# Patient Record
Sex: Female | Born: 1945 | Race: White | Hispanic: No | Marital: Married | Smoking: Never smoker
Health system: Southern US, Community
[De-identification: ages and names within clinical notes are randomized; demographics above are authoritative.]

## PROBLEM LIST (undated history)

## (undated) DIAGNOSIS — E785 Hyperlipidemia, unspecified: Secondary | ICD-10-CM

## (undated) DIAGNOSIS — E039 Hypothyroidism, unspecified: Secondary | ICD-10-CM

## (undated) DIAGNOSIS — Q600 Renal agenesis, unilateral: Secondary | ICD-10-CM

## (undated) DIAGNOSIS — R002 Palpitations: Secondary | ICD-10-CM

## (undated) DIAGNOSIS — M797 Fibromyalgia: Secondary | ICD-10-CM

## (undated) DIAGNOSIS — F32A Depression, unspecified: Secondary | ICD-10-CM

## (undated) DIAGNOSIS — IMO0002 Reserved for concepts with insufficient information to code with codable children: Secondary | ICD-10-CM

## (undated) DIAGNOSIS — F329 Major depressive disorder, single episode, unspecified: Secondary | ICD-10-CM

## (undated) DIAGNOSIS — Z9889 Other specified postprocedural states: Secondary | ICD-10-CM

## (undated) DIAGNOSIS — M1990 Unspecified osteoarthritis, unspecified site: Secondary | ICD-10-CM

## (undated) DIAGNOSIS — R112 Nausea with vomiting, unspecified: Secondary | ICD-10-CM

## (undated) DIAGNOSIS — N189 Chronic kidney disease, unspecified: Secondary | ICD-10-CM

## (undated) DIAGNOSIS — E669 Obesity, unspecified: Secondary | ICD-10-CM

## (undated) DIAGNOSIS — I2510 Atherosclerotic heart disease of native coronary artery without angina pectoris: Secondary | ICD-10-CM

## (undated) DIAGNOSIS — J3489 Other specified disorders of nose and nasal sinuses: Secondary | ICD-10-CM

## (undated) DIAGNOSIS — I10 Essential (primary) hypertension: Secondary | ICD-10-CM

## (undated) HISTORY — DX: Hyperlipidemia, unspecified: E78.5

## (undated) HISTORY — PX: TONSILLECTOMY: SUR1361

## (undated) HISTORY — DX: Depression, unspecified: F32.A

## (undated) HISTORY — DX: Atherosclerotic heart disease of native coronary artery without angina pectoris: I25.10

## (undated) HISTORY — DX: Chronic kidney disease, unspecified: N18.9

## (undated) HISTORY — PX: CHOLECYSTECTOMY: SHX55

## (undated) HISTORY — PX: TOTAL KNEE ARTHROPLASTY: SHX125

## (undated) HISTORY — DX: Major depressive disorder, single episode, unspecified: F32.9

## (undated) HISTORY — DX: Hypothyroidism, unspecified: E03.9

## (undated) HISTORY — DX: Obesity, unspecified: E66.9

## (undated) HISTORY — DX: Palpitations: R00.2

## (undated) HISTORY — DX: Essential (primary) hypertension: I10

## (undated) HISTORY — PX: APPENDECTOMY: SHX54

---

## 1976-06-14 HISTORY — PX: TOTAL ABDOMINAL HYSTERECTOMY: SHX209

## 2002-04-03 ENCOUNTER — Ambulatory Visit (HOSPITAL_BASED_OUTPATIENT_CLINIC_OR_DEPARTMENT_OTHER): Admission: RE | Admit: 2002-04-03 | Discharge: 2002-04-03 | Payer: Self-pay | Admitting: Orthopedic Surgery

## 2002-06-14 HISTORY — PX: LAPAROSCOPIC INCISIONAL / UMBILICAL / VENTRAL HERNIA REPAIR: SUR789

## 2002-11-23 ENCOUNTER — Observation Stay (HOSPITAL_COMMUNITY): Admission: RE | Admit: 2002-11-23 | Discharge: 2002-11-24 | Payer: Self-pay | Admitting: General Surgery

## 2003-03-21 ENCOUNTER — Inpatient Hospital Stay (HOSPITAL_COMMUNITY): Admission: RE | Admit: 2003-03-21 | Discharge: 2003-03-26 | Payer: Self-pay | Admitting: Orthopedic Surgery

## 2003-03-24 ENCOUNTER — Encounter: Payer: Self-pay | Admitting: Orthopedic Surgery

## 2008-05-28 ENCOUNTER — Ambulatory Visit: Payer: Self-pay | Admitting: Cardiology

## 2008-05-31 ENCOUNTER — Ambulatory Visit: Payer: Self-pay

## 2008-06-17 ENCOUNTER — Ambulatory Visit: Payer: Self-pay | Admitting: Cardiology

## 2008-06-17 LAB — CONVERTED CEMR LAB
BUN: 10 mg/dL (ref 6–23)
Basophils Absolute: 0 10*3/uL (ref 0.0–0.1)
Basophils Relative: 0.6 % (ref 0.0–3.0)
CO2: 34 meq/L — ABNORMAL HIGH (ref 19–32)
Calcium: 9.8 mg/dL (ref 8.4–10.5)
Chloride: 104 meq/L (ref 96–112)
Creatinine, Ser: 0.8 mg/dL (ref 0.4–1.2)
Eosinophils Absolute: 0.3 10*3/uL (ref 0.0–0.7)
Eosinophils Relative: 4.7 % (ref 0.0–5.0)
GFR calc Af Amer: 94 mL/min
GFR calc non Af Amer: 78 mL/min
Glucose, Bld: 98 mg/dL (ref 70–99)
HCT: 42.7 % (ref 36.0–46.0)
Hemoglobin: 14.6 g/dL (ref 12.0–15.0)
INR: 1 (ref 0.8–1.0)
Lymphocytes Relative: 36.9 % (ref 12.0–46.0)
MCHC: 34.3 g/dL (ref 30.0–36.0)
MCV: 90.2 fL (ref 78.0–100.0)
Monocytes Absolute: 0.6 10*3/uL (ref 0.1–1.0)
Monocytes Relative: 9.3 % (ref 3.0–12.0)
Neutro Abs: 3.3 10*3/uL (ref 1.4–7.7)
Neutrophils Relative %: 48.5 % (ref 43.0–77.0)
Platelets: 241 10*3/uL (ref 150–400)
Potassium: 4 meq/L (ref 3.5–5.1)
Prothrombin Time: 10.6 s — ABNORMAL LOW (ref 10.9–13.3)
RBC: 4.73 M/uL (ref 3.87–5.11)
RDW: 12.2 % (ref 11.5–14.6)
Sodium: 143 meq/L (ref 135–145)
WBC: 6.7 10*3/uL (ref 4.5–10.5)
aPTT: 31.5 s — ABNORMAL HIGH (ref 21.7–29.8)

## 2008-06-18 ENCOUNTER — Ambulatory Visit: Payer: Self-pay | Admitting: Cardiovascular Disease

## 2008-06-18 ENCOUNTER — Inpatient Hospital Stay (HOSPITAL_BASED_OUTPATIENT_CLINIC_OR_DEPARTMENT_OTHER): Admission: RE | Admit: 2008-06-18 | Discharge: 2008-06-18 | Payer: Self-pay | Admitting: Cardiovascular Disease

## 2008-06-20 ENCOUNTER — Inpatient Hospital Stay (HOSPITAL_COMMUNITY): Admission: RE | Admit: 2008-06-20 | Discharge: 2008-06-24 | Payer: Self-pay | Admitting: Orthopedic Surgery

## 2008-06-28 DIAGNOSIS — I10 Essential (primary) hypertension: Secondary | ICD-10-CM

## 2008-06-28 DIAGNOSIS — E785 Hyperlipidemia, unspecified: Secondary | ICD-10-CM

## 2008-06-28 DIAGNOSIS — E039 Hypothyroidism, unspecified: Secondary | ICD-10-CM

## 2008-07-12 ENCOUNTER — Ambulatory Visit: Payer: Self-pay | Admitting: Cardiology

## 2008-09-09 ENCOUNTER — Encounter: Payer: Self-pay | Admitting: Cardiology

## 2008-09-09 ENCOUNTER — Encounter (INDEPENDENT_AMBULATORY_CARE_PROVIDER_SITE_OTHER): Payer: Self-pay | Admitting: *Deleted

## 2008-09-09 LAB — CONVERTED CEMR LAB
ALT: 11 units/L
AST: 15 units/L
Alkaline Phosphatase: 79 units/L
Cholesterol: 167 mg/dL
HDL: 66 mg/dL
LDL Cholesterol: 76 mg/dL
Total Bilirubin: 0.4 mg/dL
Triglyceride fasting, serum: 123 mg/dL

## 2008-11-12 ENCOUNTER — Encounter (INDEPENDENT_AMBULATORY_CARE_PROVIDER_SITE_OTHER): Payer: Self-pay | Admitting: *Deleted

## 2009-01-16 ENCOUNTER — Telehealth: Payer: Self-pay | Admitting: Cardiology

## 2009-01-22 ENCOUNTER — Encounter: Payer: Self-pay | Admitting: Cardiology

## 2009-01-22 ENCOUNTER — Ambulatory Visit: Payer: Self-pay | Admitting: Cardiology

## 2009-01-22 DIAGNOSIS — R079 Chest pain, unspecified: Secondary | ICD-10-CM

## 2009-01-22 DIAGNOSIS — R0602 Shortness of breath: Secondary | ICD-10-CM | POA: Insufficient documentation

## 2009-01-22 DIAGNOSIS — I2510 Atherosclerotic heart disease of native coronary artery without angina pectoris: Secondary | ICD-10-CM | POA: Insufficient documentation

## 2009-01-23 ENCOUNTER — Encounter: Payer: Self-pay | Admitting: Cardiology

## 2009-01-24 LAB — CONVERTED CEMR LAB: Pro B Natriuretic peptide (BNP): 50.8 pg/mL (ref 0.0–100.0)

## 2009-01-29 ENCOUNTER — Encounter: Payer: Self-pay | Admitting: Cardiology

## 2009-01-29 ENCOUNTER — Ambulatory Visit: Payer: Self-pay

## 2009-03-11 ENCOUNTER — Encounter: Payer: Self-pay | Admitting: Cardiology

## 2009-03-20 ENCOUNTER — Ambulatory Visit: Payer: Self-pay | Admitting: Cardiology

## 2009-03-20 DIAGNOSIS — F329 Major depressive disorder, single episode, unspecified: Secondary | ICD-10-CM

## 2009-04-14 ENCOUNTER — Encounter: Payer: Self-pay | Admitting: Cardiology

## 2009-12-04 ENCOUNTER — Encounter: Payer: Self-pay | Admitting: Cardiology

## 2010-05-22 ENCOUNTER — Encounter: Payer: Self-pay | Admitting: Cardiology

## 2010-06-11 ENCOUNTER — Ambulatory Visit: Payer: Self-pay | Admitting: Cardiology

## 2010-06-11 ENCOUNTER — Encounter: Payer: Self-pay | Admitting: Cardiology

## 2010-06-14 HISTORY — PX: OTHER SURGICAL HISTORY: SHX169

## 2010-07-14 NOTE — Letter (Signed)
Summary: [facility] Surgical Clearance   [facility] Surgical Clearance   Imported By: Roderic Ovens 12/17/2009 14:22:16  _____________________________________________________________________  External Attachment:    Type:   Image     Comment:   External Document

## 2010-07-16 NOTE — Assessment & Plan Note (Signed)
Summary: f1y/mj   History of Present Illness: [name] is a pleasant female that I saw in the past for an abnormal nuclear study performed in December of 2009.  There was mild anterior ischemia, although shifting breast attenuation could not be excluded.  Her ejection fraction was 58%. However, she did have some chest pain and we therefore wanted definitive evaluation.  She, therefore, underwent a cardiac catheterization on June 18, 2008.  She was found to have no obstructive coronary artery disease with the most significant being a 50-60% stenosis in the proximal right coronary artery.  Her ejection fraction was 50-55%. Stress echocardiogram in August of 2010 was normal. I last saw her in Octoberof 2010. Since then she denies any dyspnea on exertion, orthopnea, PND, pedal edema, palpitations, syncope or chest pain.   Current Medications (verified): 1)  Lisinopril 20 Mg Tabs (Lisinopril) .... Take One Tablet By Mouth Daily 2)  Levothyroxine Sodium 75 Mcg Tabs (Levothyroxine Sodium) .... Take 1 Tablet By Mouth Once A Day 3)  Crestor 20 Mg Tabs (Rosuvastatin Calcium) .... Take One Tablet By Mouth Daily. 4)  Aspirin 81 Mg Tbec (Aspirin) .... Take One Tablet By Mouth Daily 5)  Megared Cap .... Take 1 Capsule By Mouth Once A Day 6)  Women's Ultra Mega Active Vitapak .... One A Day 7)  Caltrate 600+d 600-400 Mg-Unit Tabs (Calcium Carbonate-Vitamin D) .Marland Kitchen.. 1 Tab By Mouth Once Daily 8)  Alprazolam 1 Mg Tabs (Alprazolam) .... As Needed 9)  Ambien Cr 12.5 Mg Cr-Tabs (Zolpidem Tartrate) .Marland Kitchen.. 1 Tab By Mouth At Bedtime 10)  Cymbalta 30 Mg Cpep (Duloxetine Hcl) .... 3 Daily  Allergies: No Known Drug Allergies  Past History:  Past Medical History: Reviewed history from 01/22/2009 and no changes required. hx of palpitations Depression Hyperlipidemia Hypertension Hypothyroidism Coronary artery disease  Past Surgical History: Reviewed history from 06/28/2008 and no changes required. Abdominal  Hysterectomy-Total 1980 Appendectomy Cholecystectomy 1996 Tonsillectomy knee replacement 2003  Social History: Reviewed history from 06/28/2008 and no changes required. Full Time Married  Tobacco Use - No.  Alcohol Use - no  Review of Systems       She does have some pain in her left shoulder from recent fracture as well as pain in her left knee but no fevers or chills, productive cough, hemoptysis, dysphasia, odynophagia, melena, hematochezia, dysuria, hematuria, rash, seizure activity, orthopnea, PND, pedal edema, claudication. Remaining systems are negative.   Vital Signs:  Patient profile:   65 year old female Height:      70 inches Weight:      236 pounds BMI:     33.98 Pulse rate:   72 / minute Resp:     14 per minute BP sitting:   127 / 87  (left arm)  Vitals Entered By: Kem Parkinson (June 11, 2010 12:26 PM)  Physical Exam  General:  Well-developed obese in no acute distress.  Skin is warm and dry.  HEENT is normal.  Neck is supple. No thyromegaly.  Chest is clear to auscultation with normal expansion.  Cardiovascular exam is regular rate and rhythm.  Abdominal exam nontender or distended. No masses palpated. Extremities show left upper extremity in a brace neuro grossly intact    EKG  Procedure date:  06/11/2010  Findings:      Sinus rhythm at a rate of 72. Nonspecific T-wave changes.  Impression & Recommendations:  Problem # 1:  CAD (ICD-414.00) Continue aspirin, ACE inhibitor and statin. Her updated medication list for this  problem includes:    Lisinopril 20 Mg Tabs (Lisinopril) .Marland Kitchen... Take one tablet by mouth daily    Aspirin 81 Mg Tbec (Aspirin) .Marland Kitchen... Take one tablet by mouth daily  Problem # 2:  HYPOTHYROIDISM (ICD-244.9)  Her updated medication list for this problem includes:    Levothyroxine Sodium 75 Mcg Tabs (Levothyroxine sodium) .Marland Kitchen... Take 1 tablet by mouth once a day  Problem # 3:  HYPERTENSION, BENIGN ESSENTIAL  (ICD-401.1) Blood pressure controlled. Continue present medication. Potassium and renal function followed by primary care. Her updated medication list for this problem includes:    Lisinopril 20 Mg Tabs (Lisinopril) .Marland Kitchen... Take one tablet by mouth daily    Aspirin 81 Mg Tbec (Aspirin) .Marland Kitchen... Take one tablet by mouth daily  Problem # 4:  HYPERLIPIDEMIA-MIXED (ICD-272.4) Continue statin. Lipids and liver monitored by primary care. Her updated medication list for this problem includes:    Crestor 20 Mg Tabs (Rosuvastatin calcium) .Marland Kitchen... Take one tablet by mouth daily.  Patient Instructions: 1)  Your physician wants you to follow-up in:  ONE YEAR You will receive a reminder letter in the mail two months in advance. If you don't receive a letter, please call our office to schedule the follow-up appointment.

## 2010-09-28 LAB — BASIC METABOLIC PANEL
BUN: 9 mg/dL (ref 6–23)
CO2: 30 mEq/L (ref 19–32)
CO2: 31 mEq/L (ref 19–32)
Calcium: 8.8 mg/dL (ref 8.4–10.5)
Calcium: 8.9 mg/dL (ref 8.4–10.5)
Chloride: 106 mEq/L (ref 96–112)
Creatinine, Ser: 0.73 mg/dL (ref 0.4–1.2)
GFR calc Af Amer: >60 mL/min (ref >60)
GFR calc Af Amer: >60 mL/min (ref >60)
GFR calc non Af Amer: >60 mL/min (ref >60)
Glucose, Bld: 105 mg/dL — ABNORMAL HIGH (ref 70–99)
Glucose, Bld: 127 mg/dL — ABNORMAL HIGH (ref 70–99)
Sodium: 135 mEq/L (ref 135–145)
Sodium: 139 mEq/L (ref 135–145)

## 2010-09-28 LAB — URINE CULTURE: Colony Count: >=100000

## 2010-09-28 LAB — URINALYSIS, ROUTINE W REFLEX MICROSCOPIC
Glucose, UA: NEGATIVE mg/dL
Ketones, ur: 15 mg/dL — AB
Protein, ur: NEGATIVE mg/dL
Urobilinogen, UA: 1 mg/dL (ref 0.0–1.0)

## 2010-09-28 LAB — PROTIME-INR: Prothrombin Time: 12.7 seconds (ref 11.6–15.2)

## 2010-09-28 LAB — COMPREHENSIVE METABOLIC PANEL
AST: 19 U/L (ref 0–37)
Albumin: 3.9 g/dL (ref 3.5–5.2)
Calcium: 9.6 mg/dL (ref 8.4–10.5)
Creatinine, Ser: 0.75 mg/dL (ref 0.4–1.2)
GFR calc Af Amer: >60 mL/min (ref >60)
GFR calc non Af Amer: >60 mL/min (ref >60)

## 2010-09-28 LAB — CBC
HCT: 33.2 % — ABNORMAL LOW (ref 36.0–46.0)
Hemoglobin: 10.3 g/dL — ABNORMAL LOW (ref 12.0–15.0)
Hemoglobin: 11.3 g/dL — ABNORMAL LOW (ref 12.0–15.0)
Hemoglobin: 9.4 g/dL — ABNORMAL LOW (ref 12.0–15.0)
MCHC: 33.6 g/dL (ref 30.0–36.0)
MCHC: 34 g/dL (ref 30.0–36.0)
MCHC: 34.2 g/dL (ref 30.0–36.0)
MCV: 89.1 fL (ref 78.0–100.0)
MCV: 89.6 fL (ref 78.0–100.0)
Platelets: 252 10*3/uL (ref 150–400)
RBC: 3.73 MIL/uL — ABNORMAL LOW (ref 3.87–5.11)
RDW: 12.8 % (ref 11.5–15.5)
RDW: 12.9 % (ref 11.5–15.5)

## 2010-09-28 LAB — DIFFERENTIAL
Eosinophils Relative: 3 % (ref 0–5)
Lymphocytes Relative: 32 % (ref 12–46)
Lymphs Abs: 2.3 10*3/uL (ref 0.7–4.0)
Monocytes Absolute: 0.5 10*3/uL (ref 0.1–1.0)

## 2010-09-28 LAB — TYPE AND SCREEN: ABO/RH(D): A POS

## 2010-09-28 LAB — URINE MICROSCOPIC-ADD ON

## 2010-10-27 NOTE — Assessment & Plan Note (Signed)
[facility]                            CARDIOLOGY OFFICE NOTE   NAME:AUGUSTINERand, Boller                      MRN:          [id number]  DATE:06/17/2008                            DOB:          1946-04-08    Ms. Eddington is a very pleasant 65 year old female who I recently saw  on May 28, 2008.  We were asked to see her prior to knee  replacement preoperatively and also secondary to chest pain.  We felt  that her symptoms are somewhat atypical.  We therefore scheduled her to  have a Myoview on May 31, 2008.  Her ejection fraction was 68%.  There was mild anterior ischemia (could not rule out shifting breast  attenuation).  Since then she has had one further episode of chest pain.  This occurred at rest approximately 2 weeks ago.  The pain was  substernal in location without radiation.  There was no associated  nausea, vomiting, shortness of breath or diaphoresis.  She described the  pain as a tightness.  It was not pleuritic, positional, nor was it  related to food.  It lasted for approximately 5-10 minutes and resolved  spontaneously.  She denies any dyspnea on exertion, orthopnea or PND.  She has not had any syncope.   MEDICATIONS INCLUDE:  1. Cymbalta 9 mg p.o. daily.  2. Pravachol 40 mg daily.  3. Levothyroxine 0.075 mg daily.  4. Lisinopril 10 mg p.o. daily.  5. Aspirin 81 mg p.o. daily.  6. Temazepam 30 mg p.o. daily.  7. Multivitamin.  8. Fish oil.   PHYSICAL EXAM:  Shows a blood pressure of 150/80 and her pulse is 56.  She weighs 211 pounds.  HEENT:  Normal.  NECK:  Supple with no bruits.  CHEST:  Clear.  CARDIOVASCULAR EXAM:  Reveals a regular rate and rhythm.  ABDOMINAL EXAM:  Shows no tenderness.  EXTREMITIES:  Show no edema.   DIAGNOSES:  1. Abnormal nuclear study:  I have reviewed the patient's study with      her and her husband.  There appears to be mild anterior ischemia,      although certainly shifting breast  attenuation could cause this      defect.  I do not think it is a high-risk study, but given the      recurrent nature of her symptoms, I think we need a definitive      evaluation, particular prior to her surgery.  We will therefore      schedule her for a cardiac catheterization.  The risk and benefits      have been discussed and she agrees to proceed.  We will try to      schedule this for tomorrow as her surgery is scheduled on the 7th      of this month.  I think if her catheterization is unremarkable,      then she could proceed safely with her surgery.  2. Preoperative evaluation prior to knee surgery:  We are scheduling      the catheterization as described above.  3. Hypertension:  We will  continue with the present blood pressure      medications.  Her lisinopril can be increased in the future if her      blood pressure continues to be elevated.  4. Hypothyroidism.  5. Hyperlipidemia:  She will continue on her Pravachol and this is      being managed by her primary care physician.  6. History of depression.   I will see her back in approximately 4 weeks.     Madolyn Frieze Jens Som, MD, [facility]  Electronically Signed    BSC/MedQ  DD: 06/17/2008  DT: 06/17/2008  Job #: [id number]   cc:   Thereasa Distance A. Chaney Malling, M.D.

## 2010-10-27 NOTE — Cardiovascular Report (Signed)
[name], BUSHARD             ACCOUNT NO.:  [id number]   MEDICAL RECORD NO.:  [id number]          PATIENT TYPE:  OIB   LOCATION:  1965                         FACILITY:  [facility]   PHYSICIAN:  Verne Carrow, MDDATE OF BIRTH:  01/15/1946   DATE OF PROCEDURE:  DATE OF DISCHARGE:                            CARDIAC CATHETERIZATION   PRIMARY CARDIOLOGIST:  Madolyn Frieze. Jens Som, MD, [facility]   PRIMARY CARE PHYSICIAN:  Dr. Lonie Peak.   PROCEDURES PERFORMED:  1. Left heart catheterization.  2. Selective coronary angiography.  3. Left ventricular angiogram.   OPERATOR:  Verne Carrow, MD   INDICATION:  Chest pain in a patient with an abnormal Myoview that  showed possible anterior wall ischemia.   DETAILS OF PROCEDURE:  The patient was brought to the outpatient cardiac  catheterization laboratory after signing informed consent for the  procedure.  The right groin was prepped and draped in the sterile  fashion.  A 1% lidocaine was used for local anesthesia.  A 4-French  sheath was inserted into the right femoral artery without difficulty.  A  4-French JL5 diagnostic catheter was used to selectively engage and  inject the left coronary system.  A 4-French 3DRC catheter was used to  selectively engage and inject the right coronary artery.  There was a  stenosis noted in the proximal portion of the right coronary artery that  was felt to be moderate in significance.  I did give 200 mcg of  intracoronary nitroglycerin to make sure this lesion was real.  Following the injection of nitroglycerin, the 50-60% lesion in the  proximal right coronary artery was persistent.  At this point, a 4-  [place] pigtail catheter was used to cross the aortic valve into the left  ventricle.  Following the performance of the left ventricular angiogram,  the pigtail catheter was pulled back across the aortic valve with no  significant pressure gradient measured.   The patient tolerated the  procedure well and was taken to the holding  area in stable condition.   ANGIOGRAPHIC FINDINGS:  1. The left main coronary artery is very short and has no evidence of      disease.  2. The left anterior descending is a large vessel that coursed to the      apex and gives off an early ramus intermedius branch.  There is a      30% plaque noted in the proximal portion of the ramus intermedius      branch.  There were 4 small-sized diagonal branches that arise from      the LAD.  There is plaque disease noted in the mid LAD with mild      luminal irregularities in the mid and distal LAD.  There is no      significant obstructive disease in the left anterior descending      system.  3. The circumflex artery is a moderate-sized vessel that gives off an      obtuse marginal branch.  There are mild luminal irregularities      noted in the mid circumflex artery as well as the obtuse marginal  branch.  There is no significant obstructive disease noted in this      vessel.  4. The right coronary artery is a large dominant vessel that has a 50-      60% stenosis in the proximal portion.  There is a 40% stenosis      noted in the mid-to-distal segment.  This artery bifurcates into a      large posterior descending artery and a branching posterolateral      segment.  5. Left ventricular angiogram demonstrates normal systolic function      with an ejection fraction of 50-55%.   HEMODYNAMIC DATA:  Central aortic pressure 167/78.  Left ventricular  pressure 163/13.  End-diastolic pressure 13.   IMPRESSION:  1. Moderate nonobstructive coronary artery disease.  2. Normal left ventricular systolic function.   RECOMMENDATIONS:  I recommend continued medical management.  The patient  will be seen in followup by Dr. Jens Som in 1-2 weeks.      Verne Carrow, MD  Electronically Signed     CM/MEDQ  D:  06/18/2008  T:  06/19/2008  Job:  [id number]   cc:   Madolyn Frieze. Jens Som, MD, [facility]   Dr. Lonie Peak

## 2010-10-27 NOTE — Op Note (Signed)
NAMEKORIE, STREAT             ACCOUNT NO.:  [id number]   MEDICAL RECORD NO.:  [id number]          PATIENT TYPE:  INP   LOCATION:  5039                         FACILITY:  [facility]   PHYSICIAN:  Lenard Galloway. Mortenson, M.D.DATE OF BIRTH:  21-Dec-1945   DATE OF PROCEDURE:  06/20/2008  DATE OF DISCHARGE:                               OPERATIVE REPORT   JUSTIFICATION:  A 62-year female with end-stage osteoarthritis of the  left knee which has failed all conservative care.  Now admitted for  total knee replacement on the left.  Questions were answered and  encouraged preoperatively.  Complications were discussed extensively and  the patient wishes to proceed.   PREOPERATIVE DIAGNOSIS:  End-stage osteoarthritis, left knee.   POSTOPERATIVE DIAGNOSIS:  End-stage osteoarthritis, left knee.   OPERATION:  Left total knee using computer navigation with a standard  plus left cemented femoral component and a size 4 cemented MBT keeled  tibial tray with a metal back patella standard plus cemented and a  standard plus 10-mm poly insert.   SURGEON:  Lenard Galloway. Chaney Malling, MD   ASSISTANT:  Oris Drone. Petrarca, PA-C   ANESTHESIA:  General.   PROCEDURE IN DETAIL:  The patient was placed on the operating table in  supine position and the pneumatic tourniquet applied to the left upper  thigh.  The entire left lower extremity was prepped with DuraPrep and  draped out in the usual manner.  After complete draping and prepping,  the leg was wrapped out in Esmarch and tourniquet was elevated.  An  incision made in the midline starting at the patella, tibial tubercle,  and carried proximally.  Skin edges were retracted.  Bleeders were  coagulated.  A long medial parapatellar incision was then made and the  patella was everted laterally.  Knee was flexed.  Both medial and  lateral menisci were excised and cruciate ligaments were excised.  Next,  the tibial and femoral arrays were inserted in a standard manner  using  Schanz pins.  Once this was accomplished, we then did computer  registration.  First we registered the femoral head centered, then a  tibial mechanical axis, then we defined the proximal tibial plateau and  then finally defined the medial and lateral femoral epicondyles.  Once  this was accomplished, this was registered and tibial planning was then  started.  Tibial resection guide was put in position and the settings  were registered.  Once this was accomplished, the tibial resection guide  was locked in place following computer promptings and locked in position  with fixation pins.  The proximal tibia was then amputated in standard  manner.  Using the confirmation guide, small adjustment was made in the  tibial cut.  Next, the soft tissue balancing was done both in flexion  and extension using a tension device and following computer promptings  which registered the balancing of the components.  Femoral implant  planning was then started.  Medial femoral condyle and lateral femoral  condyle were defined and planned.  Following computer promptings, the  resection guide #1 was placed over the distal end of the  femur, locked  in position, and anterior and posterior cuts were made.  The  confirmation guide was used to clean-up the cuts.  The cutting guide #2  was placed over the distal end of femur, locked in position according to  the computer promptings, and the distal end of the femur was then  amputated.  With the knee in extension and flexion, the spacer blocks  were inserted with a 10-mm block and it seemed to have excellent  balancing of the ligaments.  Once this was accomplished, the final  chamfer guide was placed over the distal end of the femur and locked in  position.  Chamfer cuts were made and drill holes were made.  All excess  bone was then removed.  Trial components were then selected, a size 4.  The tibia was then subluxed anteriorly and the tibial cut surface   measured to size 4.  The size 4 trial cutting block was put in position  and locked in place.  The tower was then put in place.  A drill hole was  placed in the proximal tibia.  The keel guide was then driven down  through the tibial trial and this locked this in very nicely.  A 10-mm  poly insert was inserted over the proximal end of the tibial trial.  A  standard plus femoral component was placed over the distal end of the  femur and a 10-mm trial poly was inserted and the knee was then  articulated.  The knee was then put through full range of motion.  There  was wonderful balancing of the collateral ligaments in both flexion and  extension.  The knee was completely stable.  There was full extension  and full flexion.  Next, the patella was everted laterally and the  posterior aspect of the patella was cut.  Three drill holes were placed  using cutting guide and a standard plus patella trial was inserted.  Again, the knee was articulated and put through full range of motion and  the patella tracked very nicely midline with no lateral tilting.  I was  very pleased with the tissue balancing and the components.  There was  full flexion, full extension, and according to the computer zero varus  and valgus deformity.  All the trial components were removed.  Pulsating  lavage was used to remove all debris.  Glue was mixed.  Each of the  components were then progressively inserted starting with tibia and then  the distal end of femur and the patella.  The poly insert trial was  inserted and knee articulated and held in extension as the glue cured.  Once the glue had set up, a small osteotome was used to remove any  excess glue.  The pulsating lavage was used once again and all debris  was removed.  Once this accomplished my satisfaction, FloSeal was  inserted and spread throughout all the soft tissue surfaces.  Bleeders  were coagulated.  Then the tourniquet was dropped and bleeders were   coagulated with good hemostasis.  No arterial pumpers were seen.  The  capsule was clean.  No bleeding posteriorly.  The final poly was then  inserted and snap-fitted in place.  Knee was put through full range of  motion again.  Again, there was wonderful flexion and extension with  stability.  AP drawer was negative.  Good alignment in varus and valgus.  The medial parapatellar incision was then closed with interrupted heavy  Ethibond  sutures.  Vicryl was used to close the subcutaneous tissue and  stainless steel staples were used to close the skin.  Sterile dressings  were applied and the patient returned to the recovery room in excellent  condition.  Technically, this procedure went extremely well.  I was very  pleased with final construct.   DRAINS:  None.      Rodney A. Chaney Malling, M.D.  Electronically Signed     RAM/MEDQ  D:  06/20/2008  T:  06/21/2008  Job:  [id number]

## 2010-10-27 NOTE — Assessment & Plan Note (Signed)
[facility]                            CARDIOLOGY OFFICE NOTE   NAME:AUGUSTINEChantale, Drake                      MRN:          [id number]  DATE:05/28/2008                            DOB:          1945-08-07    [name] is a very pleasant 65 year old female with past medical  history of hypertension, hyperlipidemia, hypothyroidism whom I have  asked to evaluate for chest pain preoperatively prior to knee  replacement.  The patient states that in the 1980s, she was seen at the  [facility] for palpitations.  Apparently, the workup was negative,  but I do not have those records available.  She does have a history of  palpitations and has previously been on Cardizem.  However, recently,  she was complaining of fatigue and noticed that her heart rate was in  the 40s.  Her Cardizem apparently has been discontinued and she is now  on lisinopril and her palpitations are improved, and her fatigue is also  improved.  She is also scheduled to undergo knee replacement and we were  asked to evaluate preoperative.  Note, approximately 2 weeks ago, while  riding to the gym, she developed substernal chest pain.  It is described  as a burning sensation.  It did not radiate.  It was not pleuritic or  positional nor is it related to food.  There was no associated nausea,  vomiting, shortness of breath, or diaphoresis.  It lasted for  approximately 15 minutes and resolved spontaneously.  She had no pain  since then.  Note, she does not have dyspnea on exertion, orthopnea,  PND, pedal edema, palpitations at present or syncope.  She does exercise  routinely without having chest pain.   MEDICATIONS:  1. Cymbalta 90 mg p.o. daily.  2. Pravachol 40 mg p.o. daily.  3. Levothyroxine 0.075 mg p.o. daily.  4. Lisinopril 10 mg p.o. daily.  5. Aspirin 81 mg p.o. daily.  6. Temazepam 30 mg p.o. daily.  7. Multivitamin daily.  8. Fish oil.   ALLERGIES:  She has no known  drug allergies.   SOCIAL HISTORY:  She does not smoke nor does she consume alcohol.  She  is married.   FAMILY HISTORY:  Unknown as she is adopted.   PAST MEDICAL HISTORY:  Significant for hypertension and hyperlipidemia.  There is no diabetes mellitus.  She does have hypothyroidism.  She has a  history of palpitations as described in the HPI.  She does have a  history of depression as well.  She has had previous abdominal surgeries  as well as hysterectomy, appendectomy, cholecystectomy, and  tonsillectomy.  She has had previous knee replacement as well.   REVIEW OF SYSTEMS:  She denies any headaches or fevers or chills.  There  is no productive cough or hemoptysis.  There is no dysphagia,  odynophagia, melena, or hematochezia.  There is no dysuria or hematuria.  There is no rash or seizure activity.  There is no orthopnea, PND, or  pedal edema.  She does have some pain in her knees with ambulation.  Remaining systems are negative.  PHYSICAL EXAMINATION:  VITAL SIGNS:  Today shows a blood pressure of  130/78 and pulse is 56.  She weighs 211 pounds.  GENERAL:  She is well-developed and somewhat abuse.  She is in no acute  distress at present.  SKIN:  Warm and dry.  She does not appear to be depressed.  There is no  peripheral clubbing.  BACK:  Normal.  HEENT:  Normal with normal eyelids.  NECK:  Supple with a normal upstroke bilaterally.  There are no bruits  noted.  There is no jugular venous distention.  I could not appreciate  thyromegaly.  CHEST:  Clear to auscultation.  No expansion.  CARDIOVASCULAR:  Regular rate and rhythm.  Normal S1, S2.  There are no  murmurs, rubs, or gallops noted.  ABDOMEN:  Nontender, nondistended.  Positive bowel sounds.  No  hepatosplenomegaly.  No mass appreciated.  There is no abdominal bruit.  She has 2+ femoral pulses bilaterally.  No bruits.  EXTREMITIES:  No edema.  I could palpate no cords.  She has 2+ dorsalis  pedis pulses  bilaterally.  NEUROLOGIC:  Grossly intact.   Her electrocardiogram shows a sinus rhythm at a rate of 56.  The axis is  normal.  There are no significant ST changes noted.   DIAGNOSES:  1. Chest pain - Ms. Malak's symptoms are somewhat atypical.      However, given her risk factors in upcoming surgery, we will plan      to proceed with an adenosine Myoview.  If it shows no ischemia,      then we would not pursue further cardiac evaluation.  2. Preoperative evaluation prior to knee surgery - Again, we are      proceeding with a Myoview.  If this is normal, then she can proceed      with her surgery without further cardiac workup.  3. Hypertension - Her blood pressure is adequately controlled on      present medications.  4. Palpitations - These have improved off of the Cardizem.  If they      worsen in the future, then we could consider a low-dose beta-      blockade as tolerated by pulse.  5. Hypothyroidism.  6. Hyperlipidemia - She will continue on Pravachol, this is being      followed by Dr. Anna Genre.  7. History of depression.   We will see her back on an as-needed basis, pending the results of her  Myoview.     Madolyn Frieze Jens Som, MD, [facility]  Electronically Signed    BSC/MedQ  DD: 05/28/2008  DT: 05/29/2008  Job #: [id number]   cc:   Thereasa Distance A. Chaney Malling, M.D.  Dr. Lonie Peak

## 2010-10-27 NOTE — Assessment & Plan Note (Signed)
[facility]                            CARDIOLOGY OFFICE NOTE   NAME:AUGUSTINEHaylie, [name] Drake                      MRN:          [id number]  DATE:07/12/2008                            DOB:          1945/12/18    [name] Drake is a pleasant 65 year old female that I recently saw for  an abnormal nuclear study.  There was mild anterior ischemia, although  shifting breast attenuation could not be excluded.  However, she did  have some chest pain and we therefore wanted definite evaluation.  She,  therefore, underwent a cardiac catheterization on June 18, 2008.  She  was found to have no obstructive coronary artery disease with a most  significant being a 50-60% stenosis in the proximal right coronary  artery.  Her ejection fraction was 50-55%.  We therefore treated  medically.  Since then, she has had a knee replaced.  However, there is  no dyspnea, chest pain, palpitations, or syncope.   MEDICATIONS:  1. Cymbalta 90 mg p.o. daily.  2. Pravachol 40 mg p.o. daily.  3. Levothyroxine 0.075 mg p.o. daily.  4. Lisinopril 10 mg p.o. daily.  5. Temazepam 30 mg p.o. daily.  6. Multivitamin.  7. Calcium.  8. Stool softener.  9. Aspirin 325 mg p.o. daily.   PHYSICAL EXAMINATION:  VITAL SIGNS:  Blood pressure of 145/86 and her  pulse is 83.  She weighs 213 pounds  HEENT:  Normal.  NECK:  Supple.  CHEST:  Clear.  CARDIOVASCULAR:  Regular rhythm.  ABDOMEN:  No tenderness.  EXTREMITIES:  Mild edema on the left where she had a knee replaced.   DIAGNOSES:  1. Moderate coronary artery disease by catheterization - She will      continue on her aspirin, lisinopril, and statin.  2. Hyperlipidemia - We will have her most recent lipids and liver      forwarded to [place] from Dr. Dannielle Huh office.  A goal LDL should be      less than 70, given history of coronary disease.  3. Hypertension - her blood pressure is mildly elevated.  However, she      states it runs normal at  home.  We will make no changes.  4. Hypothyroidism.  5. History of depression.  6. Status post knee replacement.   I will see her back in 1 year.     Madolyn Frieze Jens Som, MD, [facility]  Electronically Signed    BSC/MedQ  DD: 07/12/2008  DT: 07/13/2008  Job #: [id number]   cc:   Dr. Lonie Peak

## 2010-10-30 NOTE — Op Note (Signed)
NAMEARLENA, MARSAN                         ACCOUNT NO.:  [id number]   MEDICAL RECORD NO.:  [id number]                   PATIENT TYPE:  OBV   LOCATION:  0462                                 FACILITY:  [facility]   PHYSICIAN:  Angelia Mould. Derrell Lolling, M.D.             DATE OF BIRTH:  [birth year]   DATE OF PROCEDURE:  11/23/2002  DATE OF DISCHARGE:                                 OPERATIVE REPORT   PREOPERATIVE DIAGNOSIS:  Ventral hernia.   POSTOPERATIVE DIAGNOSIS:  Ventral hernia.   OPERATION PERFORMED:  Repair of ventral hernia with 15 cm x 19 cm dual Gore-  Tex mesh.   SURGEON:  Angelia Mould. Derrell Lolling, M.D.   OPERATIVE INDICATIONS:  This is a 65 year old white female who has had a  total abdominal hysterectomy and a laparoscopic cholecystectomy in the past.  She noticed a painful bulge above her umbilicus for about three months, and  the discomfort is progressive.  She denies nausea, vomiting or diffuse pain.  She denies hernia problems in the past.   On examination she is obese.  In the midline about 6 cm above the umbilicus  there is a reducible hernia, probably 4-6 cm in size.  There is no obvious  incision in this area, but there are incisions around the umbilicus.  I  thought that this might be somewhat complex and recommended laparoscopic  repair.  She is brought to the operating room electively.   OPERATIVE FINDINGS:  The patient had multiple defects, starting at the  umbilicus and going up the midline for about 8 cm.  There were some  incarcerated omentum and fat in these, which we were able to debride out.  The liver, stomach, small intestine and large intestine looked grossly  normal.  There was no other peritoneal abnormality.  The lower midline  looked perfectly solid and secure.   OPERATIVE TECHNIQUE:  Following the induction of general endotracheal  anesthesia, the patient's abdomen was prepped and draped in a sterile  fashion.  She had been given subcutaneous Heparin 30  min preoperatively, and  intravenous antibiotics preoperatively.   A 2 cm transverse incision was made in the left flank.  Dissection was  carried down through the subcutaneous tissue in the muscle layers, and the  abdominal cavity was entered under direct vision.  A 10 mm Hasson trocar was  inserted and secured with a pursestring suture of 0 Vicryl.  Pneumoperitoneum was created.  The camera was inserted, with visualization  and findings as described above.  There was no bleeding from the trocar  site, and the intestinal tracts looked fine.   I ultimately placed a 5 mm trocar in the left upper quadrant, a 5 mm trocar  in the left lower quadrant, and two 5 mm trocars on the right mid abdomen.  We used the electrocautery, blunt dissection and harmonic scalpel to dissect  the incarcerated omentum from the supraumbilical hernia part.  We also  debrided the calciform ligament superiorly, so that we could secure the mesh  to the abdominal wall.  We found about three defects, none of which was very  large, but at least 3-4 cm in size.   Using a spinal needle, we marked the edges of the weakened area and then  drew an ellipse around this about 3 cm outside of this.  It turned out to be  about 14 x 16 cm .  There was a piece of precut dual mesh that was 15 x 19  cm, and so we chose to use that.  We brought the mesh to the operating  table.  We marked the smooth side.  We placed it on the abdominal wall,  overlying the marked template, and then remarked the outlines of the mesh.  We marked equidistant points on the mesh for suture fixation.  We used 0  Novofil popoff sutures as mattress sutures at the equidistant points on the  edge of the mesh, for intended suture fixation.  We marked the mesh to  orient it to the abdominal wall template.   We then rolled the mesh up and inserted into the abdominal cavity.  We  spread the mesh out and oriented it.  We made small incisions at the outside  of  the eight equidistant points marked on the template, and drew the sutures  through the abdominal wall, being sure to take at least a 1 cm bite of the  abdominal wall fascia.  After all of the sutures had been placed, we then  pulled the mesh up and found that it looked like it was covering the defect  point nicely, and there was no significant redundancy nor was there any  unusual tension.  We then just tied all of the fixation sutures, inspected  this and it looked fine.  We then secured the edge of the mesh with 5 mm  screw tacks all the way around, placing the screw tacks no more than 1 cm  apart circumferentially.  We inspected this from the right and the left  sides on two separate occasions, and found that it was completely secure.  We placed several of the  5 mm tacks more centrally on the mesh to secure the center part of the mesh  to the peritoneum.  We inspected everywhere, both in the upper abdomen, mid  abdomen and pelvis; found no evidence of any bleeding or bowel injury.  The  trocars were removed under direct vision.  There was no bleeding from the  trocar sites.   The pneumoperitoneum was released.  The fascia in the left flank incision  was closed with 0 Vicryl sutures.  This was irrigated out, and the incisions  were closed with subcuticular sutures of 4-0 Vicryl and Steri-Strips.  Clean  bandages were placed.  The patient was taken to the recovery room in stable  condition.   ESTIMATED BLOOD LOSS:  Approximately 25 cc.   COMPLICATIONS:  None.   COUNTS:  Sponge, needle and instrument counts were correct.                                                Angelia Mould. Derrell Lolling, M.D.    HMI/MEDQ  D:  11/23/2002  T:  11/24/2002  Job:  [id number]   cc:   Molly Maduro  Genene Churn, M.D.  [address]  [place]  [place] [address]  Fax: [phone]

## 2010-10-30 NOTE — Consult Note (Signed)
[name], [name]                         ACCOUNT NO.:  [id number]   MEDICAL RECORD NO.:  [id number]                   PATIENT TYPE:  INP   LOCATION:  5008                                 FACILITY:  [facility]   PHYSICIAN:  Lonia Blood, M.D.            DATE OF BIRTH:  1946-01-16   DATE OF CONSULTATION:  03/24/2003  DATE OF DISCHARGE:                                   CONSULTATION   REASON FOR CONSULTATION:  Hypoxia with lethargy.   HISTORY OF PRESENT ILLNESS:  [name] is a pleasant 65 year old female  who is admitted to the hospital with severe progressive right knee pain due  to end-stage osteoarthritis.  She underwent a right total knee replacement  March 21, 2003.  She has been treated with DVT prophylaxis since that time  using Lovenox, b.i.d. dosing.  Postoperative course has been unremarkable  until today.  When attempting to get up with physical therapy, the patient's  O2 saturations dropped into the mid 80's.  She became acutely short of  breath.  This episode resolved rapidly.  She has felt extremely weak all  day, however, but has no specific focused complaints.  She specifically  denies chest pain, nausea, vomiting, abdominal pain, shortness of breath, at  this time.  She denies palpitations.   REVIEW OF SYSTEMS:  Unremarkable at this time with exception to elements  noted in history of present illness above.   PAST MEDICAL HISTORY:  1. End-stage osteoarthritis of the right knee.  2. Hyperlipidemia.  3. Questionable irregular heart beat with previous workup at [facility] being negative.  4. Depression.  5. Status post tonsillectomy, cholecystectomy, abdominal hernia repair, and     hysterectomy.  6. History of tobacco abuse, discontinued 15 years ago.   OUTPATIENT MEDICATIONS:  Lipitor, clonazepam, Lexapro, aspirin,  multivitamin, calcium.   ALLERGIES:  No known drug allergies.   FAMILY HISTORY:  Adopted; therefore, history unknown.   SOCIAL HISTORY:  The patient denies alcohol use, denies illicit drug use.  She is married and has two children, both sons.  She is a Solicitor by  profession.   LABORATORY DATA:  The 12-lead EKG is normal sinus rhythm at 70 beats per  minute with some T wave aversions though minimal in III, aVF and V2 and V3.  Cardiac enzymes were unremarkable.  Basic metabolic panel is remarkable for  hypokalemia with potassium 3.3 and normal BUN to creatinine ratio.  Hemoglobin is at 8.6 with admission hemoglobin 15.3.  Platelets and white  blood cell count are normal.  Urinalysis is positive for large leukocyte  esterase and 21-50 white cells on October 4.  Chest x-ray today reveals tiny  bilateral effusions with right basilar infiltrate versus atelectasis.   PHYSICAL EXAMINATION:  VITAL SIGNS:  Temperature 99.3, blood pressure  122/48, heart rate 71, O2 saturation 95% on room air at present, 98% on 2 L.  GENERAL:  Moderately obese, well-developed, well-nourished female in no  acute respiratory distress at this time.  HEENT:  Normocephalic, atraumatic.  Pupils are equal, round and reactive to  light and accommodation.  Extraocular muscles intact bilaterally.  __________  are shown to be clear.  NECK:  No JVD, no lymphadenopathy, no thyromegaly.  LUNGS:  Mild bibasilar crackles; otherwise unremarkable without wheeze or  rhonchi.  CVS:  Regular rate and rhythm without appreciable murmur, gallop or rub with  normal S1 and S2.  ABDOMEN:  Soft.  Bowel sounds present.  No hepatosplenomegaly.  No rebound,  no ascites.  Bowel sounds positive.  EXTREMITIES:  Right lower extremity in CPM machine.  Bilateral lower  extremities without evidence of erythema or significant edema.  NEUROLOGIC:  Nonfocal.  The patient is alert and oriented x4.   RECOMMENDATIONS:  1. Hypoxemia with lethargy.  This is likely a combination of significant     atelectasis as well as possible contribution from significant anemia.      Though the patient's hemoglobin is currently 8.6, she is actually     somewhat polycythemic at baseline with a hemoglobin of approximately 15.     Chest x-ray does not reveal evidence of focal infiltrate.  History     certainly does not rule out pulmonary embolus, but in the absence of     chest pain, my suspicion this is a not exceedingly high.  The patient has     also been treated with Lovenox appropriately.  I will encourage the     patient to use her incentive spirometer.  We will continue supplemental     oxygen at this time.  We will encourage mobilization as soon as able.  We     will continue Lovenox.  We will transfuse one unit and follow symptoms.     Should the patient's hypoxia continue, we will consider CT scan of the     chest to rule out pulmonary embolism.  2. Acute blood loss anemia, symptomatic.  The patient has a baseline     hemoglobin of approximately 15 per preop labs.  Hemoglobin is 8.6 at this     time.  Though a hemoglobin of 8.6 in and of itself is not a clear     indication for transfusion at this age, the patient is, in my opinion,     symptomatic from this hemoglobin because of her elevated baseline and I     will, therefore, transfuse one unit.  I will hold a second unit and     consider transfusing it depending upon the patient's symptom complex in     the morning.  3. Urinary tract infection.  Urinalysis obtained on October 4 was strongly     suggestive of a urinary tract infection.  It is not uncommon for this to     present in this age group of females as severe lethargy.  I will,     therefore, treat empirically and request urine culture.   Thank you very much for your consultation on this pleasant lady.  I will be  happy to follow along with you.                                               Lonia Blood, M.D.    JTM/MEDQ  D:  03/24/2003  T:  03/24/2003  Job:  [id number]

## 2010-10-30 NOTE — H&P (Signed)
[name], [name]                         ACCOUNT NO.:  [id number]   MEDICAL RECORD NO.:  [id number]                   PATIENT TYPE:  INP   LOCATION:  NA                                   FACILITY:  [facility]   PHYSICIAN:  Rodney A. Chaney Malling, M.D.           DATE OF BIRTH:  1945-10-27   DATE OF ADMISSION:  03/21/2003  DATE OF DISCHARGE:                                HISTORY & PHYSICAL   CHIEF COMPLAINT:  Progressive right knee pain since 2004.   HISTORY OF PRESENT ILLNESS:  This 65 year old white female patient presented  to Dr. Lenard Galloway. [place] with a history of right knee pain since her last  knee arthroscopy.  She has had a right knee arthroscopy done by Dr.  Chaney Malling in March 2003, and then again in April 2003, due to recurrent  meniscal tears.  Since that last surgery she has been having progressively  more knee pain.  She has had no other injury to the knee.  At this point the pain is a constant burning or aching sensation, located  mostly over the lateral knee joint, with some radiation into the distal  thigh.  The pain increases the first thing in the morning, or if she sits  for a prolonged period of time, and then tries to stand.  It also hurts more  if she has to walk for a prolonged period of time.  The pain decreases with  elevation and ice.  The knee grinds and swells occasionally, but no popping,  catching, locking or giving way.  She is currently taking Aleve for pain,  and that provides a minimal amount of relief.  She has tried cortisone and  Hyalgan injections in the past, and that really provided no relief.  She is  not using any assistive devices.   ALLERGIES:  No known drug allergies.   CURRENT MEDICATIONS:  1. Lipitor 10 mg p.o. q.a.m.  2. Clonazepam 2 mg p.o. q.h.s.  3. Lexapro 10 mg p.o. q.a.m.  4. Aspirin 81 mg p.o. q.a.m., last dose on March 18, 2003.  5. Multivitamin one tab p.o. q.a.m.  6. Calcium 600 mg, one tab p.o. q.a.m.   PAST MEDICAL  HISTORY:  1. History of irregular heart beat approximately four years ago, that was     evaluated at the [facility], and the workup was completely     negative.  2. Hypercholesterolemia.  3. Depression.  She denies any history of diabetes mellitus, hypertension, thyroid disease,  hiatal hernia, reflux, peptic ulcer disease, heart disease, asthma, or any  other chronic medical condition.   PAST SURGICAL HISTORY:  1. Tonsillectomy in [place], [place] in 1953.  2. Laparoscopic cholecystectomy by Dr. Youlanda Mighty in [place], [place] in 2000.  3. Repair of an abdominal hernia by Dr. Angelia Mould. Derrell Lolling in [place],     [place] in 2004.  4. Hysterectomy by Dr. Logan Bores in [place], [place].  5. Left knee arthroscopy by Dr. Chaney Malling on April 03, 2002.  6. Right knee arthroscopy by Dr. Chaney Malling on August 22, 2001.  7. Right knee arthroscopy by Dr. Chaney Malling on September 21, 2001.  She denies any complications from the above-mentioned procedures.   SOCIAL HISTORY:  She has a one to two-pack-year-history of cigarette  smoking, which she quit 15 years ago.  She does not drink any alcohol or use  any drugs.  She is married and has two sons.  She and her husband live in a  one-story house, with four steps into the main entrance.  She works as a  Solicitor at [employer].  Her medical doctor is Dr. Andres Ege in  [place], [place].  His phone [phone].  Mr. Lonie Peak who is  P.A. for Dr. Fayrene Fearing saw her on March 19, 2003, and feels she is cleared for  surgery from a medical standpoint.   FAMILY HISTORY:  She is adopted and does not know anything about her natural  family.  Her sons are age 53 and 12, and they are both alive and well.   REVIEW OF SYSTEMS:  She has some numbness on the left side of her face, and  that was evaluated by both an ENT physician and a neurologist.  Everything  has been negative.  They do not know what has caused this or how to treat  it, so it has  been going on for about one year.  The area of numbness goes  from her left eye over to her nose and down her left lip, probably in a  trigeminal pattern.  She does complain of occasional diarrhea.  She has a  living will and her power of attorney is Mr. Milani Lowenstein, who is her  husband.  All other systems are negative and noncontributory.   PHYSICAL EXAMINATION:  GENERAL:  A well-nourished, well-developed,  moderately-overweight white female, in no acute distress.  Walks without a  limp.  Her mood and affect are appropriate.  Height is 5 feet 10 inches,  weight 224 pounds.  BMI is 31.  VITAL SIGNS:  Temperature 96.3 degrees F, pulse 58, respirations 14, blood  pressure 140/92.  HEENT:  Normocephalic, atraumatic without frontal or maxillary sinus  tenderness to palpation.  Conjunctivae pink.  Sclerae anicteric.  PERLA.  EOM's intact.  No visible external ear deformities.  Hearing is grossly  intact.  Tympanic membranes pearly gray bilaterally with good light reflex.  Nose and nasal septum midline.  Nasal mucosa pink and moist without exudates  or polyps noted.  Buccal mucosa pink and moist.  Good dentition.  Pharynx is  without erythema or exudates.  Tongue and uvula midline.  Tongue without  fasciculations.  Uvula rises equally with phonation.  NECK:  No visible masses or lesions noted.  Trachea midline.  No palpable  lymphadenopathy or thyromegaly.  Carotids +2 bilaterally without bruits.  A  full range of motion and nontender to palpation along the cervical spine.  CARDIOVASCULAR:  Heart rate and rhythm regular.  S1, S2 present, without  rubs, clicks, or murmurs noted.  LUNGS:  Respirations even and unlabored.  Breath sounds clear to  auscultation bilaterally without rales or wheezes noted.  ABDOMEN:  Rounded abdominal contour.  Bowel sounds present x4 quadrants,  soft, nontender to palpation without hepatosplenomegaly or CVA tenderness. Nontender to palpation along the entire  vertebral column.  Femoral pulses +2  bilaterally.  BREASTS/GENITOURINARY:/RECTAL/PELVIC:  These examinations  deferred at this  time.  MUSCULOSKELETAL:  She has no obvious deformities bilateral upper extremities  with a full range of motion of these extremities without pain.  Radial  pulses are +2 bilaterally.  She has a full range of motion of her hips,  ankles and toes bilaterally.  DP and PT pulses are +2.  No lower extremity  edema.  Negative Homan's sign bilaterally.  The left knee has full extension  and flexion to 135 degrees, with minimal crepitus.  There is no effusion of  the knee, and no pain with palpation along the joint line.  She is stable to  varus and valgus stress and has a negative anterior drawer.  Right knee has  pretty much full extension and flexion to 125 degrees, with a moderate  amount of crepitus.  She has about a +1 effusion of the knee.  She is tender  to palpation over the medial joint line, none really laterally.  Stable to  varus and valgus stress, and a negative anterior drawer.  NEUROLOGIC:  Alert and oriented x3.  Cranial nerves II-XII are grossly  intact.  Strength is 5/5 bilateral upper and lower extremities.  Rapid  alternating movements are intact.  Deep tendon reflexes 2+ bilateral upper  and lower extremities.   RADIOLOGIC FINDINGS:  X-rays taken of her right knee in February 2003, show  some varus deformity and loss of the joint space in the medial compartment.   IMPRESSION:  1. Osteoarthritis of right knee.  2. Hypercholesterolemia.  3. Depression.  4. Left facial numbness, of unknown etiology.  5. History of irregular heart beat, benign with negative workup.   PLAN:  The patient will be admitted to [facility] on  March 21, 2003, where she will undergo a right total knee arthroplasty by  Dr. Chaney Malling.  She will undergo all the routine preoperative laboratory  tests and studies prior to this procedure.  She has been  seen by her medical  doctor and cleared for surgery.  If we have any medical issues while she is  hospitalized, we will consult one of the hospitalists.        Legrand Pitts Duffy, P.A.                      Rodney A. Chaney Malling, M.D.    KED/MEDQ  D:  03/19/2003  T:  03/19/2003  Job:  [id number]

## 2010-10-30 NOTE — Op Note (Signed)
NAMESHARLIZE, HOAR                         ACCOUNT NO.:  [id number]   MEDICAL RECORD NO.:  [id number]                   PATIENT TYPE:  AMB   LOCATION:  DSC                                  FACILITY:  [facility]   PHYSICIAN:  Rodney A. Chaney Malling, M.D.           DATE OF BIRTH:  02/27/1946   DATE OF PROCEDURE:  04/03/2002  DATE OF DISCHARGE:                                 OPERATIVE REPORT   PREOPERATIVE DIAGNOSIS:  Tear of posterior horn medial meniscus, left knee.   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   OPERATION PERFORMED:  Arthroscopy, debridement of posterior horn medial  meniscus, left knee; chondroplasty of medial femoral condyle.   SURGEON:  Lenard Galloway. Chaney Malling, M.D.   ANESTHESIA:  General.   PATHOLOGY:  With the arthroscope in the knee, very careful examination of  both compartments was undertaken.  The inspection of the contralateral  femoral condyle and lateral tibial plateau appeared absolutely normal.  There was some fraying of the leading edge of the lateral meniscus.  The  anterior cruciate ligaments were normal.  In the medial compartment there is  a tear of the posterior horn of the medial meniscus and there was some grade  2 cartilage damage to the weightbearing area on the posterior aspect of the  medial femoral condyle.  The patellofemoral joint showed some mild  chondromalacia of the patella but the anterior femoral notch appeared fairly  normal.   DESCRIPTION OF PROCEDURE:  The patient was placed on the operating table in  the supine position with a pneumatic tourniquet about the left upper thigh.  The left leg was placed in a leg holder.  The entire left lower extremity  was prepped with DuraPrep and draped out in the usual manner.  An infusion  cannula was placed in the  superior medial pouch  and the knee distended  with saline.  Anteromedial and anterolateral  portals were made and the  arthroscope was introduced with the findings as described above.  Attention  was first turned to the lateral meniscus.  Using an intra-articular shaver  the leading edge of the lateral meniscus was debrided back.  This had  fraying of the leading edge but the rest of the meniscus appeared normal.  The arthroscope was then passed into the medial compartment.  There was  tearing of the posterior horn of the medial meniscus and this was debrided  through both anterior portals using various sized baskets.  Once this was  completed to my satisfaction, the intra-articular shaver was introduced and  debris was removed.  The remaining rim was then smoothed and balanced with  nice transition of the mid third of the medial meniscus.  There was some  cartilage damage over the posterior aspect of the medial femoral condyle and  chondroplasty shaver was used to smooth this down.  The  knee was then filled  with Marcaine.  A large bulky compressive dressing applied.  The patient was  then returned to the recovery room in excellent condition.  Technically,  this procedure went extremely well.                                               Rodney A. Chaney Malling, M.D.    RAM/MEDQ  D:  04/03/2002  T:  04/03/2002  Job:  [id number]

## 2010-10-30 NOTE — Discharge Summary (Signed)
NAMESTEFANI, [name]                         ACCOUNT NO.:  [id number]   MEDICAL RECORD NO.:  [id number]                   PATIENT TYPE:  INP   LOCATION:  5008                                 FACILITY:  [facility]   PHYSICIAN:  [name], M.D.           DATE OF BIRTH:  28-Nov-1945   DATE OF ADMISSION:  03/21/2003  DATE OF DISCHARGE:  03/26/2003                                 DISCHARGE SUMMARY   ADMISSION DIAGNOSES:  1. Osteoarthritis right knee.  2. Hypercholesterolemia.  3. History of depression.  4. History of facial numbness.  5. History of irregular heart beat with negative workup at [facility].   DISCHARGE DIAGNOSES:  1. Right total knee arthroplasty.  2. Postoperative blood loss anemia.  3. Hypoxemia/lethargy related to postoperative blood loss anemia.  4. Hypercholesterolemia.  5. History of depression.  6. History of left face numbness.  7. History of irregular heart beat, evaluated at [facility], negative     workup.   SURGICAL PROCEDURE:  On March 21 2003 the patient was taken to the  operating room by [name], M.D. assisted by Richardean Canal,  P.A.C. under general anesthesia the patient underwent a right total knee  arthroplasty with a Depuy standard plus femoral component, a standard plus  patella component and a standard plus 10-mm poly insert with a number 4 Keel  tibial tray.  All the components were cemented with polymethyl methacrylate.  The patient tolerated the procedure well.  The patient received a femoral  nerve block and was transferred to the recovery room in good condition.   CONSULTATIONS:  1. The following routine consults were requested:  Physical therapy,     occupational therapy, rehab.  2. A [facility] consult was also requested for evaluation of the     patient's hypoxemia and lethargy.   HOSPITAL COURSE:  On March 21, 2003 the patient was admitted under the care  of Rodney A. Chaney Drake, M.D.  The  patient was taken to the OR where a right  total knee arthroplasty was performed without any complications.  The  patient tolerated the procedure well and received a femoral nerve block and  was transferred to the recovery room and then to the orthopedic floor for  routine postop care.  The patient was placed on Lovenox for routine DVT  prophylaxis.   The patient then incurred a 5-day postoperative course in which the patient  initially did very well.  She just had typical low-grade temps  intermittently with no specific source. Her vital signs remained stable.  On  postoperative day #3, later in the day, the patient did develop some hypoxia  with decreased saturations with physical therapy and she just did not look  overall well, so Dr. Chaney Drake requested a [facility] consult and  routine labs were drawn for this workup.  The consult was performed and it  was felt that the patient  did have some mild hypokalemia and some hypoxia  related to probable postoperative blood loss anemia.  The patient's  hypokalemia was treated with p.o. replacement.   She was typed and crossed and transfused 2 units of packed red blood cells  without any complications with improvement of her hypoxia, her vital signs,  and overall well-being.  Her hypokalemia did resolve with the p.o.  replacement.  The patient was also noted to have a urinary tract infection.  She was placed on antibiotics for this.  The patient, over the next several  days, improved significantly.  No further medical issues were found and the  patient continued to work well with physical therapy.   The patient had no other recurrent decreased saturations.  With exercise her  vital signs otherwise remained stable.  She had a neuromotor vascularity  intact leg and her wound remained benign for any signs of infection, so it  was felt that she was both medically and orthopedically stable for discharge  home for routine home health  physical therapy care.  Arrangements were made  and she was discharged in good condition.   LABS:  1. EKG on October 10:  Normal sinus rhythm, nonspecific T wave abnormalities     at 70 beats/minute.  2. October 11:  EKG was normal sinus rhythm, nonspecific T wave     abnormalities at 64 beats/minute.  3. A chest CT performed on October 10 showed no evidence of acute pulmonary     embolism, but did find bilateral lower lobe atelectasis, right greater     than left.  Probable mild pulmonary edema mainly in the upper lung zones.     A 2-cm, fluid filled, bronchogenic versus pericardial cyst incidentally     noted.  4. CBC on October 11:  WBC 6.4, hemoglobin 9.4 (up from a low of 8.6),     hematocrit 26.8, platelets 227.  5. Routine chemistries on October 11:  Sodium 142, potassium 3.7 (up from a     low of 3.3), glucose 107 (down from a high of 136), BUN 4, creatinine     0.7.  6. Cardiac markers on October 10:  CK of 79, CK/MB 0.7 was indicated as     nonvalid due to CK less than 100.  Troponin I 0.02.  TSH on October 11:     8.148.  7. Routine urinalysis on admission showed a few epithelial cells, 21-50     wbc's, many bacteria, colony culture of 25,000 showing multiple species     present.  No uropathogens isolated, probably contaminated.  8. One unit of packed red blood cells transfused.   MEDICATIONS ON DISCHARGE FROM ORTHO FLOOR:  1. Colace 100 mg p.o. b.i.d.  2. Trinsicon 1 tablet p.o. t.i.d.  3. Laxative or enema of choice p.r.n.  4. Reglan 10 mg p.o. q.8h. p.r.n.  5. Percocet 1-2 tablets q.4-6h. p.r.n.  6. Tylenol 650 mg p.o. q.4h. p.r.n.  7. Robaxin 500 mg p.o. q.6h. p.r.n.  8. Restoril 15 mg p.o. q.h.s. p.r.n.  9. Lovenox 30 mg subcu q.12h.  10.      Zocor 20 mg p.o. daily.  11.      Klonopin 2 mg p.o. q.h.s.  12.      Lexapro 10 mg p.o. daily.  13.      Multivitamins with minerals 1 tablet p.o. daily. 14.      Calcium carbonate 500 mg p.o. daily.  15.      Bactrim  DS 1 tablet p.o. b.i.d.  16.      Potassium chloride 20 mEq p.o. daily.  17.      Aspirin 81 mg p.o. daily.   DISCHARGE INSTRUCTIONS:  1. Medications:  The patient is to resume routine home meds.     a. Percocet 5 mg 1-2 tablets p.o. q.4-6h. if needed.     b. Lovenox injection 40 mg once a day for the next 9 days.  2. Activity:  As tolerated with the use of a walker.  3. Diet:  No restriction.  4. Wound Care:  Keep wound clean.  If any concerns for infection, the     patient is to call Dr. Andreas Blower office for advice.  5. Follow up:  Call [phone] for a follow up appointment in 10 days.   CONDITION ON DISCHARGE TO HOME:  Listed as improved and good.      Jamelle Rushing, P.A.                      Rodney A. Chaney Drake, M.D.    RWK/MEDQ  D:  04/10/2003  T:  04/10/2003  Job:  [id number]

## 2010-10-30 NOTE — Op Note (Signed)
NAMEANASTASIJA, [name]                         ACCOUNT NO.:  [id number]   MEDICAL RECORD NO.:  [id number]                   PATIENT TYPE:  INP   LOCATION:  5008                                 FACILITY:  [facility]   PHYSICIAN:  Thereasa Distance A. Chaney Malling, M.D.           DATE OF BIRTH:  Jan 21, 1946   DATE OF PROCEDURE:  03/21/2003  DATE OF DISCHARGE:                                 OPERATIVE REPORT   PREOPERATIVE DIAGNOSIS:  End-stage osteoarthritis right knee.   POSTOPERATIVE DIAGNOSIS:  End-stage osteoarthritis right knee.   PROCEDURE:  Dupuy total knee replacement on the right, all components glued  in using a standard plus right femoral cemented component, a standard plus  patellar component, a standard plus 10 mm poly insert with a cemented size 4  NBT keel tibial tray.   ANESTHESIA:  General.   SURGEON:  Rodney A. Chaney Malling, M.D.   ASSISTANT:  Madilyn Fireman, P.A.-C.   DESCRIPTION OF PROCEDURE:  The patient was placed on the operating table in  the supine position. The Pneumatic tourniquet placed about the right upper  thigh.  The right lower extremity was prepped with Duraprep and draped in  the usual fashion.  The right lower extremity was wrapped with an Esmarch  and the tourniquet was elevated.  A straight midline incision was made  starting above the patella and carried down to the tibial tubercle.  Skin  edges were retracted.  Bleeders were coagulated.  A long medium parapatellar  incision was made and the patella was everted laterally.  Both the medial  and lateral meniscus were excised and the anterior cruciate ligament and  posterior cruciate ligament were released in the femoral notch.  Good access  to the proximal tibia was achieved.  Tibial guide #1 was placed over the  anterior aspect of the tibia and was placed at what was felt the appropriate  cutting level. This was locked in place with fixation pins.  Using the  guide, the proximal tibial was amputated and a nice cut  was achieved.  Attention was then turned to the distal femur.  Femoral notch was rongeured  slightly and a rasp was used to smooth this off.  The guide #1 was placed  over the anterior aspect of the femur and this measured a standard plus.  A  drill hole was made and intermedullary rod was inserted.  The seat clamp was  inserted and the knee flexed 90 degrees and with a 10 mm seat clamp, there  was excellent balance of the medial and lateral collateral ligaments.  The  femoral guide was then locked in place with fixation pins.  Using the  capture guide pin, anterior and posterior cuts were made over the distal end  of the femur. These components were removed.  The spacer guide was placed in  position again and once again the ligaments were balanced very nicely.  The  femoral guide #2 was  placed over the anterior cut surface of the femur and  held in place with intermedullary rod.  Fixation drills were inserted.  This  was placed at 4 degrees of valgus.  The distal femur was then amputated what  was felt to be appropriate length.  The femoral guide was then removed and  the spacer block inserted with the knee extended and again the medial and  lateral collateral ligaments were balanced very nicely both in flexion and  extension.  The final femoral guide was placed over the distal end of the  femur and locked in place with fixation pins.  Drill holes were made,  chamfer cuts made, and the guide removed.  All excess bone was removed.  Using a keel retractor, the tibia was subluxed anteriorly.  Proximal end of  the femur was seen very nicely.  A size 4 trial was put in place and there  was line to line contact. This was locked in place with the fixation pins.  A tower was applied.  A drill hole was made. The tower was removed and the  key punch was inserted.  This fitted very nicely.  Fixation pins were then  removed.  The blue peg was inserted and a 10 mm trial poly was inserted.  The knee was  put through a full range of motion.  There was full extension,  full flexion, good stability in flexion and extension.  No drawer.  This was  felt to be the appropriate size.   Attention turned to the posterior aspect of the patella.  The cutting guide  was put in place.  Posterior aspect of the patella amputated.  Drill guide  was applied and three drills were made.  A standard patellar trial was  inserted and articulated with a firm fit. Again the knee was put through a  full range of motion.  There was no lateral subluxation and a lateral  release was not felt to be necessary.  All of the components were removed.  All of the bone debris was removed.  The pulse saline lavage was used.  Throughout the procedure, the knee was irrigated with copious amounts of  antibiotic solution. Glue was mixed and each of the components was inserted.  The tibial tray was inserted first and excess glue was removed in a standard  manner using small glue scrapers.  Glue was placed over the distal end of  the femur and posterior aspect of the femoral condyles.  The 10 mm trial was  inserted on the proximal tibia and then the femoral component was inserted.  This impacted with a heavy impactor.  Excess glue was removed with the glue  spatulas. The knee was put in full extension and extra glue was removed.  Good alignment was achieved.  Glue was placed in the posterior aspect of the  patella and the patella component was inserted.  A patella clamp was put in  place and excess glue was removed.  Once the glue had all cured, the knee  was put through a full range of motion. There was excellent stability with  full range of motion.  Using a small osteotome, all excess glue was removed.  The poly trial was removed.  Pulse saline lavage was used.  All debris was  removed.  The tourniquet was dropped and all bleeders were coagulated.  The final poly was then inserted and the knee put through a full range of motion   once again.  A  Hemovac was inserted and the long parapatellar incision was  closed with interrupted heavy Ethibond sutures.  Vicryl was used to close  the subcutaneous tissue and stainless steel staples were used to close the  skin. Sterile dressings were applied and the patient was returned to the  recovery room in excellent condition.  Technically, this went extremely  well.  Drains were none.  Complications were none.                                               Rodney A. Chaney Malling, M.D.    RAM/MEDQ  D:  03/21/2003  T:  03/21/2003  Job:  [id number]

## 2010-10-30 NOTE — Discharge Summary (Signed)
NAMELEXIANA, SPINDEL             ACCOUNT NO.:  [id number]   MEDICAL RECORD NO.:  [id number]          PATIENT TYPE:  INP   LOCATION:  5039                         FACILITY:  [facility]   PHYSICIAN:  Lenard Galloway. Mortenson, M.D.DATE OF BIRTH:  06/23/1945   DATE OF ADMISSION:  06/20/2008  DATE OF DISCHARGE:  06/24/2008                               DISCHARGE SUMMARY   ADMISSION DIAGNOSIS:  Osteoarthritis, left knee.   DISCHARGE DIAGNOSES:  1. Osteoarthritis, left knee.  2. History of hypertension.  3. Possible sleep apnea.  4. Hypothyroidism.  5. Obesity.  6. Urinary tract infection.  7. Hypokalemia.   HISTORY:  Ms. Sandoz is a very pleasant 65 year old white female  status post right total knee arthroplasty in 2005 with good results.  Has had a long history of left medial compartment osteoarthritis of the  knee.  Complains of pain which is constant and severe of a dull aching  and burning pain.  Activity worsens her symptoms.  Rest has been  helpful.  Has failed conservative treatment including viscous  supplementation.  She is now indicated for left total knee arthroplasty.   HOSPITAL COURSE:  A 65 year old female admitted on June 20, 2008 after  appropriate laboratory studies were obtained as well as 1 gram Ancef IV  on-call to the operating room.  She was taken to the operating room  where she underwent a left total knee arthroplasty with computer  navigation.  This was accomplished by Dr. Rinaldo Ratel and assisted  by Jacqualine Code, PA-C.  She tolerated the procedure well.  She was  placed on a reduced dose Dilaudid PCA pump for pain management.  Arixtra  2.5 mg subcu q. 8 a.m. was started.  Consults PT, OT, and care  management were made.  She may be weightbearing as tolerated.  She was  allowed out of bed to chair the following day.  She was also weaned off  her O2 keeping her sats greater than 92%.  She was allowed out of bed  and physical therapy was begun.  The  remainder of her hospital course  was uneventful except for beginning of Levaquin 750 mg p.o. daily for 5  days for a urinary tract infection.  Once she was ambulatory and stable,  she was discharged to follow back up in the office.   LABORATORY STUDIES:  Admitted with hemoglobin of 11.3, hematocrit 33.2%,  white count 8200, and platelets 225,000.  Discharge hemoglobin 9.4,  hematocrit 27.8%, white count 7700, and platelets 192,000.  Preop sodium  139, potassium 3.8, chloride 106, CO2 30, glucose 118, BUN 11, and  creatinine 0.79.  Discharge sodium 135, potassium 3.5, chloride 99, CO2  29, glucose 105, BUN 13, and creatinine 0.86.  GFR was greater than 60.   DISCHARGE INSTRUCTIONS:  No restriction on diet.  Keep incision clean  and dry.  Change dressing daily and cover with sterile 4 x 4s.  She may  shower on Sunday.  Walker, weightbearing as tolerated.  No driving or  lifting for 6 weeks.  CPM 0-7 degrees at 6-8 hours per day, increasing  by 5-10 degrees  per day.   DISCHARGE MEDICATIONS:  1. Arixtra 2.5 mg inject daily at 8:00 a.m. as taught, last dose      June 27, 2008.  2. Begin aspirin 325 mg daily on June 28, 2008.  3. Percocet 5/325 one to two tablets every 4 hours as needed for pain.  4. Robaxin 500 mg one p.o. every 6-8 as needed for spasms.  5. Levaquin 750 mg 1 tablet daily for urinary tract infection for 5      days.   FOLLOWUP:  She will need to follow back up with Dr. Chaney Malling on July 03, 2008.   CONDITION ON DISCHARGE:  Discharged in improved condition.      Oris Drone Petrarca, P.A.-C.      Rodney A. Chaney Malling, M.D.  Electronically Signed    BDP/MEDQ  D:  08/08/2008  T:  08/09/2008  Job:  [id number]

## 2010-12-22 ENCOUNTER — Other Ambulatory Visit (HOSPITAL_COMMUNITY): Payer: Self-pay | Admitting: Specialist

## 2010-12-22 DIAGNOSIS — M25562 Pain in left knee: Secondary | ICD-10-CM

## 2010-12-22 DIAGNOSIS — T84033A Mechanical loosening of internal left knee prosthetic joint, initial encounter: Secondary | ICD-10-CM

## 2010-12-30 ENCOUNTER — Ambulatory Visit (HOSPITAL_COMMUNITY)
Admission: RE | Admit: 2010-12-30 | Discharge: 2010-12-30 | Disposition: A | Discharge status: Home / Self Care | Source: Ambulatory Visit | Attending: Specialist | Admitting: Specialist

## 2010-12-30 ENCOUNTER — Encounter (HOSPITAL_COMMUNITY)
Admission: RE | Admit: 2010-12-30 | Discharge: 2010-12-30 | Disposition: A | Discharge status: Home / Self Care | Source: Ambulatory Visit | Attending: Specialist | Admitting: Specialist

## 2010-12-30 DIAGNOSIS — Z96659 Presence of unspecified artificial knee joint: Secondary | ICD-10-CM | POA: Insufficient documentation

## 2010-12-30 DIAGNOSIS — M25569 Pain in unspecified knee: Secondary | ICD-10-CM | POA: Insufficient documentation

## 2010-12-30 DIAGNOSIS — M25562 Pain in left knee: Secondary | ICD-10-CM

## 2010-12-30 DIAGNOSIS — T84033A Mechanical loosening of internal left knee prosthetic joint, initial encounter: Secondary | ICD-10-CM

## 2010-12-30 MED ORDER — TECHNETIUM TC 99M MEDRONATE IV KIT
25.0000 | PACK | Freq: Once | INTRAVENOUS | Status: AC | PRN
  Administered 2010-12-30: 26.9 via INTRAVENOUS

## 2011-01-14 ENCOUNTER — Other Ambulatory Visit: Payer: Self-pay | Admitting: Specialist

## 2011-01-14 DIAGNOSIS — M4800 Spinal stenosis, site unspecified: Secondary | ICD-10-CM

## 2011-01-18 MED ORDER — DIAZEPAM 2 MG PO TABS
10.0000 mg | ORAL_TABLET | Freq: Once | ORAL | Status: AC
  Administered 2011-01-19: 10 mg via ORAL

## 2011-01-19 ENCOUNTER — Ambulatory Visit
Admission: RE | Admit: 2011-01-19 | Discharge: 2011-01-19 | Disposition: A | Discharge status: Home / Self Care | Source: Ambulatory Visit | Attending: Specialist | Admitting: Specialist

## 2011-01-19 VITALS — BP 175/77 | HR 47

## 2011-01-19 DIAGNOSIS — M549 Dorsalgia, unspecified: Secondary | ICD-10-CM

## 2011-01-19 DIAGNOSIS — M4800 Spinal stenosis, site unspecified: Secondary | ICD-10-CM

## 2011-01-19 MED ORDER — IOHEXOL 180 MG/ML  SOLN
15.0000 mL | Freq: Once | INTRAMUSCULAR | Status: AC | PRN
  Administered 2011-01-19: 15 mL via INTRATHECAL

## 2011-01-19 NOTE — Progress Notes (Signed)
Pt in recovery area resting quietly.  Denies pain or any discomfort at present.  jkl

## 2011-01-19 NOTE — Progress Notes (Signed)
Pt states she has been off Cymbalta for the past two days.  jkl

## 2011-01-21 ENCOUNTER — Encounter: Payer: Self-pay | Admitting: Cardiology

## 2011-06-18 ENCOUNTER — Telehealth: Payer: Self-pay | Admitting: Cardiology

## 2011-06-18 NOTE — Telephone Encounter (Signed)
Spoke with pt, she request not to weigh at the follow up appt.

## 2011-06-18 NOTE — Telephone Encounter (Signed)
New msg Pt's husband called and made appt and he said pt wanted to ask you couple of questions. Please call back

## 2011-07-09 ENCOUNTER — Ambulatory Visit: Admitting: Cardiology

## 2011-09-13 ENCOUNTER — Telehealth: Payer: Self-pay | Admitting: Cardiology

## 2011-09-13 NOTE — Telephone Encounter (Signed)
Pt Picked up Records 09/13/11/km

## 2011-09-13 NOTE — Telephone Encounter (Signed)
Pt Called this am,stating she signed a ROI last week she said she spoke with Ulyses Jarred do  Not see a Phone Note, or signed ROI in Epic system I have Copied all Records, and she will come today and pick these up and Sign  Another ROI  09/13/11/KM

## 2012-03-17 ENCOUNTER — Encounter (INDEPENDENT_AMBULATORY_CARE_PROVIDER_SITE_OTHER): Payer: Self-pay | Admitting: General Surgery

## 2012-03-17 ENCOUNTER — Ambulatory Visit (INDEPENDENT_AMBULATORY_CARE_PROVIDER_SITE_OTHER): Payer: Medicare Other | Admitting: General Surgery

## 2012-03-17 VITALS — BP 146/82 | HR 88 | Temp 97.1°F | Resp 16 | Ht 67.0 in | Wt 247.8 lb

## 2012-03-17 DIAGNOSIS — I10 Essential (primary) hypertension: Secondary | ICD-10-CM

## 2012-03-17 DIAGNOSIS — E039 Hypothyroidism, unspecified: Secondary | ICD-10-CM

## 2012-03-17 DIAGNOSIS — Z6838 Body mass index (BMI) 38.0-38.9, adult: Secondary | ICD-10-CM

## 2012-03-17 DIAGNOSIS — E669 Obesity, unspecified: Secondary | ICD-10-CM

## 2012-03-17 NOTE — Patient Instructions (Signed)
We will start the evaluation process

## 2012-03-22 ENCOUNTER — Encounter (INDEPENDENT_AMBULATORY_CARE_PROVIDER_SITE_OTHER): Payer: Self-pay | Admitting: General Surgery

## 2012-03-22 ENCOUNTER — Telehealth (INDEPENDENT_AMBULATORY_CARE_PROVIDER_SITE_OTHER): Payer: Self-pay | Admitting: General Surgery

## 2012-03-22 NOTE — Telephone Encounter (Signed)
Request faxed to [facility] at [phone] for OP note and pathology reports from patient's 2012 surgery.

## 2012-03-22 NOTE — Progress Notes (Addendum)
Patient ID: [name], female   DOB: [birth year], 66 y.o.   MRN: [id number]  Chief Complaint  Patient presents with  . Bariatric Pre-op    HPI [name] is a 66 y.o. female.   HPI 66 yo obese female referred by Dr Sudie Bailey for evaluation for weight loss surgery. The patient is specifically interested in laparoscopic adjustable gastric band surgery. The patient states that she has tried multiple different things to help her lose weight but all have been unsuccessful. She has tried [employer], [employer], Slim fast, Alli, and Adipex all without any long-term success. She was most successful with Nutrisystem when she lost approximately 35 pounds but essentially regained it. She is interested in laparoscopic adjustable gastric band surgery because it is not as invasive and the weight loss is slower. Moreover she has a personal physician who underwent lap band surgery who has been very successful Past Medical History  Diagnosis Date  . Palpitations     history  . Depression   . Hyperlipidemia   . Hypertension   . Hypothyroidism   . CAD (coronary artery disease)   . Chronic kidney disease     normal kidney function    Past Surgical History  Procedure Date  . Total abdominal hysterectomy 1980  . Appendectomy   . Cholecystectomy   . Tonsillectomy   . Total knee arthroplasty 2003  . Laparoscopic incisional / umbilical / ventral hernia repair 2004    upper midline hernia, 15 x 19cm dual Goretex mesh  . Left nephrectomy 2012    noncancerous mass, [place]    Family History  Problem Relation Age of Onset  . Adopted: Yes    Social History History  Substance Use Topics  . Smoking status: Never Smoker   . Smokeless tobacco: Not on file  . Alcohol Use: No    No Known Allergies  Current Outpatient Prescriptions  Medication Sig Dispense Refill  . ALPRAZolam (XANAX) 1 MG tablet Take 1 mg by mouth as needed.        Marland Kitchen aspirin 81 MG EC tablet Take 81 mg by mouth  daily.        . Calcium Carbonate-Vitamin D (CALTRATE 600+D) 600-400 MG-UNIT per tablet Take 1 tablet by mouth daily.        . DULoxetine (CYMBALTA) 30 MG capsule Take 30 mg by mouth 3 (three) times daily.        Marland Kitchen levothyroxine (SYNTHROID, LEVOTHROID) 75 MCG tablet Take 75 mcg by mouth daily.        Marland Kitchen lisinopril (PRINIVIL,ZESTRIL) 20 MG tablet Take 20 mg by mouth daily.        . NON FORMULARY 1 capsule daily. MEGARED CAP       . OVER THE COUNTER MEDICATION daily. WOMEN's ULTRA MEGA ACTIVE VITAPAK       . rosuvastatin (CRESTOR) 20 MG tablet Take 20 mg by mouth daily.        Marland Kitchen zolpidem (AMBIEN CR) 12.5 MG CR tablet Take 12.5 mg by mouth at bedtime.          Review of Systems Review of Systems  Constitutional: Negative for fever, chills and unexpected weight change.  HENT: Negative for hearing loss, congestion, sore throat, trouble swallowing, neck stiffness and voice change.   Eyes: Negative for visual disturbance.  Respiratory: Negative for cough, chest tightness, shortness of breath and wheezing.   Cardiovascular: Negative for chest pain, palpitations and leg swelling.       Had a  cardiac cath 06/18/2008 by Dr Jens Som - non-obstructive CAD; nml stress echo May 2010  Gastrointestinal: Negative for nausea, vomiting, abdominal pain, diarrhea, constipation, blood in stool, abdominal distention and anal bleeding.       Reports a normal colonoscopy about 10 yrs ago  Genitourinary: Negative for dysuria, hematuria, vaginal bleeding and difficulty urinating.       Had L kidney removed in 2012 for mass which was non-cancerous; G3P3; had a normal mammogram just recently.   Musculoskeletal: Positive for back pain. Negative for arthralgias.       Back and b/l knee pain; has DDD of L3-4, L4-5, seen by spine surgeon  Skin: Negative for rash and wound.  Neurological: Negative for seizures, syncope and headaches.       Denies TIA, amaurosis fugax  Hematological: Negative for adenopathy. Does not  bruise/bleed easily.       No personal h/o blood clots  Psychiatric/Behavioral: Negative for confusion.    Blood pressure 146/82, pulse 88, temperature 97.1 F (36.2 C), temperature source Temporal, resp. rate 16, height 5\' 7"  (1.702 m), weight 247 lb 12.8 oz (112.401 kg).  Physical Exam Physical Exam  Vitals reviewed. Constitutional: She is oriented to person, place, and time. She appears well-developed and well-nourished. No distress.  HENT:  Head: Normocephalic and atraumatic.  Right Ear: External ear normal.  Left Ear: External ear normal.  Eyes: Conjunctivae normal are normal. No scleral icterus.  Neck: Normal range of motion. Neck supple. No tracheal deviation present. No thyromegaly present.  Cardiovascular: Normal rate, regular rhythm and normal heart sounds.   Pulmonary/Chest: Effort normal and breath sounds normal. No respiratory distress. She has no wheezes.  Abdominal: Soft. Bowel sounds are normal. She exhibits no distension. There is no tenderness. There is no rigidity and no guarding. No hernia.         Multiple well healed trocar incisions on abdominal wall  Musculoskeletal: Normal range of motion. She exhibits no edema and no tenderness.  Neurological: She is alert and oriented to person, place, and time. She exhibits normal muscle tone.  Skin: Skin is warm and dry. No rash noted. She is not diaphoretic. No erythema. No pallor.  Psychiatric: She has a normal mood and affect. Her behavior is normal. Judgment and thought content normal.    Data Reviewed Dr Ethelene Hal' note 01/14/12 Dr Sudie Bailey note 01/2012  Assessment    Hypertension CAD Dislipidemia Obesity BMI 38.8 Degenerative Disc Disease Hypothyroid Possible stress urinary incontinence Fatty infiltration of liver    Plan    The patient meets weight loss surgery criteria. I think the patient would be an acceptable candidate for Laparoscopic adjustable gastric band placement.  We discussed laparoscopic  adjustable gastric banding. The patient was given Agricultural engineer. We discussed the risk and benefits of surgery including but not limited to bleeding, infection, injury to surrounding structures, blood clot formation such as deep venous thrombosis or pulmonary embolism, need to convert to an open procedure, band slippage, band erosion, failure to loose weight, port complications (leak or flippage), potential need for reoperative surgery, esophageal dilatation, worsening reflux, and vitamin deficiencies. We discussed the typical post operative recovery course. We discussed that their postoperative diet will be modified for several weeks. We specifically talked about the need to be on a liquid diet for one to 2 weeks after surgery. We also discussed the typical postoperative course with a laparoscopic adjustable gastric band and the need for frequent postoperative visits to assess the volume status of the band.  We discussed the typical expected weight loss with a laparoscopic adjustable gastric band. I explained to the patient that they can expect to lose 40-60% of their excess body weight if they are compliant with their postoperative instructions. However I did explain that some patients loose less than 40% and some patients lose more than 60% of their excess body weight.  I explained that the likelihood of improvement in their obesity is good.  I explained to the patient that we will start our evaluation process which includes labs, Upper GI to evaluate stomach and swallowing anatomy, nutritionist consultation, psychiatrist consultation, EKG, CXR, abdominal ultrasound, cardiac clearance. 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 Andrey Campanile, MD, FACS General, Bariatric, & Minimally Invasive Surgery [facility], [place]          [facility] M 03/22/2012, 9:07 AM

## 2012-03-22 NOTE — Telephone Encounter (Signed)
Message copied by Liliana Cline on Wed Mar 22, 2012 10:22 AM ------      Message from: Andrey Campanile, ERIC M      Created: Wed Mar 22, 2012  9:13 AM       Hi,            We need to try to obtain records from this pt's left nephrectomy in 2012 down in [place].             She will also need cardiac clearance.            [name]. Andrey Campanile, MD, FACS      General, Bariatric, & Minimally Invasive Surgery      [facility], [place]

## 2012-03-22 NOTE — Telephone Encounter (Signed)
Clearance request sent to Dr Jens Som.

## 2012-03-23 ENCOUNTER — Encounter: Payer: Self-pay | Admitting: *Deleted

## 2012-03-23 ENCOUNTER — Encounter: Payer: Medicare Other | Attending: General Surgery | Admitting: *Deleted

## 2012-03-23 VITALS — Ht 67.0 in | Wt 251.0 lb

## 2012-03-23 DIAGNOSIS — Z01818 Encounter for other preprocedural examination: Secondary | ICD-10-CM | POA: Insufficient documentation

## 2012-03-23 DIAGNOSIS — Z713 Dietary counseling and surveillance: Secondary | ICD-10-CM | POA: Insufficient documentation

## 2012-03-23 DIAGNOSIS — E669 Obesity, unspecified: Secondary | ICD-10-CM

## 2012-03-23 NOTE — Progress Notes (Signed)
  Pre-Op Assessment Visit:  Pre-Operative LAGB Surgery  Medical Nutrition Therapy:  Appt start time: 0945   End time:  1110.  Patient was seen on 03/23/2012 for Pre-Operative LAGB Nutrition Assessment. Assessment and letter of approval faxed to [facility] Bariatric Surgery Program coordinator on 03/23/2012.  Approval letter sent to [facility] Scan center and will be available in the chart under the media tab.  Handouts given during visit include:  Pre-Op Goals   Bariatric Surgery Protein Shakes  Patient to call for Pre-Op and Post-Op Nutrition Education at the [facility] when surgery is scheduled.

## 2012-03-23 NOTE — Patient Instructions (Signed)
   Follow Pre-Op Nutrition Goals to prepare for Lapband Surgery.   Call the [facility] at [phone] once you have been given your surgery date to enrolled in the Pre-Op Nutrition Class. You will need to attend this nutrition class 3-4 weeks prior to your surgery. 

## 2012-03-28 ENCOUNTER — Encounter: Payer: Self-pay | Admitting: Cardiology

## 2012-03-28 ENCOUNTER — Ambulatory Visit (INDEPENDENT_AMBULATORY_CARE_PROVIDER_SITE_OTHER): Payer: Medicare Other | Admitting: Cardiology

## 2012-03-28 VITALS — BP 149/85 | HR 78 | Wt 250.0 lb

## 2012-03-28 DIAGNOSIS — E785 Hyperlipidemia, unspecified: Secondary | ICD-10-CM

## 2012-03-28 DIAGNOSIS — I10 Essential (primary) hypertension: Secondary | ICD-10-CM

## 2012-03-28 DIAGNOSIS — Z01810 Encounter for preprocedural cardiovascular examination: Secondary | ICD-10-CM

## 2012-03-28 DIAGNOSIS — I2510 Atherosclerotic heart disease of native coronary artery without angina pectoris: Secondary | ICD-10-CM

## 2012-03-28 NOTE — Assessment & Plan Note (Signed)
Patient had a cardiac catheterization approximately 3 years ago with results as outlined. Stress test in August of 2010 normal. No symptoms since. I do not think functional study is indicated. Continue aspirin and statin and patient may proceed with surgery.

## 2012-03-28 NOTE — Assessment & Plan Note (Signed)
Blood pressure controlled normally by patient report. Continue present medications. Potassium and renal function monitored by primary care.

## 2012-03-28 NOTE — Progress Notes (Signed)
HPI: [name] is a pleasant female that I saw in the past for an abnormal nuclear study performed in December of 2009. There was mild anterior ischemia, although shifting breast attenuation could not be excluded. Her ejection fraction was 58%. However, she did have some chest pain and we therefore wanted definitive evaluation. She, therefore, underwent a cardiac catheterization on June 18, 2008. She was found to have no obstructive coronary artery disease with the most significant being a 50-60% stenosis in the proximal right coronary artery. Her ejection fraction was 50-55%. Stress echocardiogram in August of 2010 was normal. I last saw her in Dec 2011. Since then she denies any dyspnea on exertion, orthopnea, PND, pedal edema, palpitations, syncope or chest pain.   Current Outpatient Prescriptions  Medication Sig Dispense Refill  . aspirin 81 MG EC tablet Take 81 mg by mouth daily.        . BUPROPION HCL PO Take 150 mg by mouth daily.       . Calcium Carbonate-Vitamin D (CALTRATE 600+D) 600-400 MG-UNIT per tablet Take 1 tablet by mouth daily.        . Cholecalciferol (VITAMIN D3) 1000 UNITS CAPS Take 1 capsule by mouth daily.      . clonazePAM (KLONOPIN) 2 MG tablet Take 2 mg by mouth at bedtime.      Marland Kitchen doxepin (SINEQUAN) 10 MG capsule Take 10 mg by mouth at bedtime.      . DULoxetine (CYMBALTA) 30 MG capsule Take 30 mg by mouth 3 (three) times daily.        Marland Kitchen gabapentin (NEURONTIN) 300 MG capsule Take 300 mg by mouth at bedtime.       Marland Kitchen levothyroxine (SYNTHROID, LEVOTHROID) 75 MCG tablet Take 75 mcg by mouth daily.        Marland Kitchen lisinopril (PRINIVIL,ZESTRIL) 20 MG tablet Take 20 mg by mouth daily.        . NON FORMULARY 1 capsule daily. MEGARED CAP       . pregabalin (LYRICA) 75 MG capsule Take 75 mg by mouth 2 (two) times daily.      Marland Kitchen pyridOXINE (VITAMIN B-6) 100 MG tablet Take 100 mg by mouth daily.      . rosuvastatin (CRESTOR) 20 MG tablet Take 20 mg by mouth daily.        . vitamin  B-12 (CYANOCOBALAMIN) 1000 MCG tablet Take 1,000 mcg by mouth daily.         Past Medical History  Diagnosis Date  . Palpitations     history  . Depression   . Hyperlipidemia   . Hypertension   . Hypothyroidism   . CAD (coronary artery disease)   . Chronic kidney disease     normal kidney function; 1 kidney removed 2012  . Obesity (BMI 30-39.9)     Past Surgical History  Procedure Date  . Appendectomy   . Cholecystectomy   . Tonsillectomy   . Total knee arthroplasty 2003 (R); 2010 (L); 2012 (L clean-out)    Bilateral  . Laparoscopic incisional / umbilical / ventral hernia repair 2004    upper midline hernia, 15 x 19cm dual Goretex mesh  . Left nephrectomy 2012    noncancerous mass, [place]  . Total abdominal hysterectomy 1978    History   Social History  . Marital Status: Married    Spouse Name: N/A    Number of Children: N/A  . Years of Education: N/A   Occupational History  . Full Time  Social History Main Topics  . Smoking status: Never Smoker   . Smokeless tobacco: Not on file  . Alcohol Use: No  . Drug Use: No  . Sexually Active: Not on file   Other Topics Concern  . Not on file   Social History Narrative  . No narrative on file    ROS: arthralgias but no fevers or chills, productive cough, hemoptysis, dysphasia, odynophagia, melena, hematochezia, dysuria, hematuria, rash, seizure activity, orthopnea, PND, pedal edema, claudication. Remaining systems are negative.  Physical Exam: Well-developed obese in no acute distress.  Skin is warm and dry.  HEENT is normal.  Neck is supple.  Chest is clear to auscultation with normal expansion.  Cardiovascular exam is regular rate and rhythm.  Abdominal exam nontender or distended. No masses palpated. Extremities show no edema. neuro grossly intact  ECG sinus rhythm at a rate of 78. Nonspecific ST changes.

## 2012-03-28 NOTE — Assessment & Plan Note (Signed)
Continue statin. Lipids and liver monitored by primary care. 

## 2012-03-28 NOTE — Assessment & Plan Note (Signed)
Continue aspirin and statin. 

## 2012-03-28 NOTE — Patient Instructions (Addendum)
Your physician wants you to follow-up in: ONE YEAR WITH DR CRENSHAW You will receive a reminder letter in the mail two months in advance. If you don't receive a letter, please call our office to schedule the follow-up appointment.  

## 2012-03-31 ENCOUNTER — Telehealth: Payer: Self-pay | Admitting: Cardiology

## 2012-03-31 NOTE — Telephone Encounter (Signed)
Note faxed to [phone] per request.

## 2012-03-31 NOTE — Telephone Encounter (Signed)
Pt needs ov notes from 10-15 faxed to [facility] @  [phone] att dr Billie Ruddy, was told it would be sent that day but as of today they do not have it

## 2012-04-05 ENCOUNTER — Ambulatory Visit (HOSPITAL_COMMUNITY)
Admission: RE | Admit: 2012-04-05 | Discharge: 2012-04-05 | Disposition: A | Discharge status: Home / Self Care | Payer: Medicare Other | Source: Ambulatory Visit | Attending: General Surgery | Admitting: General Surgery

## 2012-04-05 DIAGNOSIS — K7689 Other specified diseases of liver: Secondary | ICD-10-CM | POA: Insufficient documentation

## 2012-04-05 DIAGNOSIS — I2510 Atherosclerotic heart disease of native coronary artery without angina pectoris: Secondary | ICD-10-CM | POA: Insufficient documentation

## 2012-04-05 DIAGNOSIS — Z6838 Body mass index (BMI) 38.0-38.9, adult: Secondary | ICD-10-CM | POA: Insufficient documentation

## 2012-04-05 DIAGNOSIS — IMO0002 Reserved for concepts with insufficient information to code with codable children: Secondary | ICD-10-CM | POA: Insufficient documentation

## 2012-04-05 DIAGNOSIS — I10 Essential (primary) hypertension: Secondary | ICD-10-CM | POA: Insufficient documentation

## 2012-04-05 DIAGNOSIS — E669 Obesity, unspecified: Secondary | ICD-10-CM

## 2012-04-05 DIAGNOSIS — I517 Cardiomegaly: Secondary | ICD-10-CM | POA: Insufficient documentation

## 2012-04-05 DIAGNOSIS — E039 Hypothyroidism, unspecified: Secondary | ICD-10-CM | POA: Insufficient documentation

## 2012-04-13 ENCOUNTER — Telehealth (INDEPENDENT_AMBULATORY_CARE_PROVIDER_SITE_OTHER): Payer: Self-pay | Admitting: General Surgery

## 2012-04-13 DIAGNOSIS — E669 Obesity, unspecified: Secondary | ICD-10-CM

## 2012-04-13 DIAGNOSIS — E039 Hypothyroidism, unspecified: Secondary | ICD-10-CM

## 2012-04-13 NOTE — Telephone Encounter (Signed)
Message copied by Liliana Cline on Thu Apr 13, 2012 12:30 PM ------      Message from: Andrey Campanile, ERIC M      Created: Thu Apr 13, 2012 12:21 PM      Regarding: FW: needs TSH      Contact: [phone]       pls order TSH      ----- Message -----         From: Jari Sportsman         Sent: 04/13/2012  12:14 PM           To: Atilano Ina, MD,FACS, Liliana Cline, CMA      Subject: needs TSH                                                Pt was to get blood work from PCP from date 02/16/12      Labs in chart does not have a TSH - insurance required TSH level       Can you please order for patient       Thanks       Florentina Addison

## 2012-04-13 NOTE — Telephone Encounter (Signed)
Home number busy and mobile is wrong number. Calling to make patient aware she needs additional lab work drawn. CBC, H. Pylori, TSH and T 4. Will try again later.

## 2012-04-14 LAB — CBC WITH DIFFERENTIAL/PLATELET
Basophils Absolute: 0 10*3/uL (ref 0.0–0.1)
Eosinophils Relative: 8 % — ABNORMAL HIGH (ref 0–5)
Lymphocytes Relative: 24 % (ref 12–46)
MCV: 86.8 fL (ref 78.0–100.0)
Neutro Abs: 4.7 10*3/uL (ref 1.7–7.7)
Neutrophils Relative %: 61 % (ref 43–77)
Platelets: 268 10*3/uL (ref 150–400)
RBC: 4.48 MIL/uL (ref 3.87–5.11)
RDW: 14.3 % (ref 11.5–15.5)
WBC: 7.7 10*3/uL (ref 4.0–10.5)

## 2012-04-14 LAB — TSH: TSH: 1.849 u[IU]/mL (ref 0.350–4.500)

## 2012-04-14 NOTE — Telephone Encounter (Signed)
Patient made aware she needs labs drawn. She prefers to get drawn with Dr Sudie Bailey. Orders faxed to their office.

## 2012-04-17 LAB — H. PYLORI ANTIBODY, IGG: H Pylori IgG: 0.79 {ISR}

## 2012-04-18 ENCOUNTER — Other Ambulatory Visit (INDEPENDENT_AMBULATORY_CARE_PROVIDER_SITE_OTHER): Payer: Self-pay | Admitting: General Surgery

## 2012-05-04 ENCOUNTER — Encounter: Payer: Medicare Other | Attending: General Surgery | Admitting: *Deleted

## 2012-05-04 VITALS — Ht 67.0 in | Wt 250.8 lb

## 2012-05-04 DIAGNOSIS — E669 Obesity, unspecified: Secondary | ICD-10-CM

## 2012-05-04 DIAGNOSIS — Z713 Dietary counseling and surveillance: Secondary | ICD-10-CM | POA: Insufficient documentation

## 2012-05-04 DIAGNOSIS — Z01818 Encounter for other preprocedural examination: Secondary | ICD-10-CM | POA: Insufficient documentation

## 2012-05-07 ENCOUNTER — Encounter: Payer: Self-pay | Admitting: *Deleted

## 2012-05-07 NOTE — Progress Notes (Signed)
Bariatric Class:  Appt start time: 0830 end time:  0930.  Pre-Operative Nutrition Class  Patient was seen on 05/04/12 for Pre-Operative Bariatric Surgery Education [facility].   Surgery date: 05/22/12 Surgery type: LAGB Start weight at [facility]: 251 lbs (03/23/12) Weight today: 250.8 lbs BMI: 39.3 kg/m^2  Samples given per MNT protocol: Bariatric Advantage Multivitamin - 4 ea Lot # [id number] Exp: 12/13 (alerted pt to upcoming expiration date)  Bariatric Advantage Calcium + D - 1 ea Lot # [id number] Exp: 12/13 (alerted pt to upcoming expiration date)   Celebrate Vitamins Multivitamin - 1 ea Lot # [id number] Exp:07/15  Unjury Protein Powder - 1 pkt Lot # [id number] Exp:03/15  Premier Protein: 1 bottle Lot # [id number] Exp: 01/01/12  The following the learning objective met by the patient during this course:  Identifies Pre-Op Dietary Goals and will begin 2 weeks pre-operatively  Identifies appropriate sources of fluids and proteins   States protein recommendations and appropriate sources pre and post-operatively  Identifies Post-Operative Dietary Goals and will follow for 2 weeks post-operatively  Identifies appropriate multivitamin and calcium sources  Describes the need for physical activity post-operatively and will follow MD recommendations  States when to call healthcare provider regarding medication questions or post-operative complications  Handouts given during class include:  Pre-Op Bariatric Surgery Diet Handout  Protein Shake Handout  Post-Op Bariatric Surgery Nutrition Handout  BELT Program Information Flyer  Support Group Information Flyer  [facility] Bariatric Supplements Price List  Follow-Up Plan: Patient will follow-up at [facility] 2 weeks post operatively for diet advancement per MD.

## 2012-05-07 NOTE — Patient Instructions (Signed)
Follow:   Pre-Op Diet per MD 2 weeks prior to surgery  Phase 2- Liquids (clear/full) 2 weeks after surgery  Vitamin/Mineral/Calcium guidelines for purchasing bariatric supplements  Exercise guidelines pre and post-op per MD  Follow-up at [facility] in 2 weeks post-op for diet advancement. Contact Sivan Cuello as needed with questions/concerns. 

## 2012-05-10 ENCOUNTER — Encounter (HOSPITAL_COMMUNITY): Payer: Self-pay | Admitting: Pharmacy Technician

## 2012-05-17 ENCOUNTER — Ambulatory Visit (INDEPENDENT_AMBULATORY_CARE_PROVIDER_SITE_OTHER): Payer: Medicare Other | Admitting: General Surgery

## 2012-05-17 ENCOUNTER — Encounter (INDEPENDENT_AMBULATORY_CARE_PROVIDER_SITE_OTHER): Payer: Self-pay | Admitting: General Surgery

## 2012-05-17 VITALS — BP 138/82 | HR 80 | Temp 97.1°F | Resp 20 | Ht 67.0 in | Wt 252.0 lb

## 2012-05-17 DIAGNOSIS — E039 Hypothyroidism, unspecified: Secondary | ICD-10-CM

## 2012-05-17 DIAGNOSIS — Z6839 Body mass index (BMI) 39.0-39.9, adult: Secondary | ICD-10-CM

## 2012-05-17 DIAGNOSIS — I10 Essential (primary) hypertension: Secondary | ICD-10-CM

## 2012-05-17 DIAGNOSIS — E785 Hyperlipidemia, unspecified: Secondary | ICD-10-CM

## 2012-05-17 DIAGNOSIS — E669 Obesity, unspecified: Secondary | ICD-10-CM

## 2012-05-17 NOTE — Progress Notes (Signed)
Patient ID: [name], female   DOB: 09/06/1945, 66 y.o.   MRN: [id number]  Chief Complaint  Patient presents with  . Bariatric Pre-op    lap band 05/22/2012    HPI [name] is a 66 y.o. female.  HPI 66 yo WF who I initially saw in early October for LAGB surgery evaluation comes in for her preop appt. She is currently scheduled for surgery on 12/9. She has started her preop diet but admits she slipped during Thanksgiving. She states that she likes the Premier shakes a lot.   PMHx, PSHx, SOCHx, FAMHx, ALL reviewed and unchanged  Past Medical History  Diagnosis Date  . Palpitations     history  . Depression   . Hyperlipidemia   . Hypertension   . Hypothyroidism   . CAD (coronary artery disease)   . Chronic kidney disease     normal kidney function; 1 kidney removed 2012  . Obesity (BMI 30-39.9)     Past Surgical History  Procedure Date  . Appendectomy   . Cholecystectomy   . Tonsillectomy   . Total knee arthroplasty 2003 (R); 2010 (L); 2012 (L clean-out)    Bilateral  . Laparoscopic incisional / umbilical / ventral hernia repair 2004    upper midline hernia, 15 x 19cm dual Goretex mesh  . Left nephrectomy 2012    noncancerous mass, [place]  . Total abdominal hysterectomy 1978    Family History  Problem Relation Age of Onset  . Adopted: Yes    Social History History  Substance Use Topics  . Smoking status: Never Smoker   . Smokeless tobacco: Not on file  . Alcohol Use: No    No Known Allergies  Current Outpatient Prescriptions  Medication Sig Dispense Refill  . aspirin 81 MG EC tablet Take 81 mg by mouth daily.        . buPROPion (WELLBUTRIN XL) 150 MG 24 hr tablet Take 150 mg by mouth daily before breakfast.      . Calcium Carbonate-Vitamin D (CALTRATE 600+D) 600-400 MG-UNIT per tablet Take 1 tablet by mouth daily.        . celecoxib (CELEBREX) 200 MG capsule Take 200 mg by mouth 2 (two) times daily.      . Cholecalciferol (VITAMIN D3) 1000  UNITS CAPS Take 1 capsule by mouth daily.      . clonazePAM (KLONOPIN) 2 MG tablet Take 2 mg by mouth at bedtime.      . doxepin (SINEQUAN) 25 MG capsule Take 25 mg by mouth at bedtime.      . DULoxetine (CYMBALTA) 30 MG capsule Take 30 mg by mouth 3 (three) times daily.        . gabapentin (NEURONTIN) 300 MG capsule Take 300 mg by mouth at bedtime.       . levothyroxine (SYNTHROID, LEVOTHROID) 75 MCG tablet Take 75 mcg by mouth daily before breakfast.       . lisinopril (PRINIVIL,ZESTRIL) 20 MG tablet Take 20 mg by mouth daily before breakfast.       . NON FORMULARY 1 capsule daily. MEGA RED CAP      . pregabalin (LYRICA) 75 MG capsule Take 75 mg by mouth 2 (two) times daily.      . pyridOXINE (VITAMIN B-6) 100 MG tablet Take 100 mg by mouth daily.      . rosuvastatin (CRESTOR) 20 MG tablet Take 20 mg by mouth at bedtime.       . vitamin B-12 (CYANOCOBALAMIN)   1000 MCG tablet Take 1,000 mcg by mouth daily.        Review of Systems Review of Systems  Constitutional: Negative for fever, activity change, appetite change and unexpected weight change.  HENT: Negative for nosebleeds and congestion.   Eyes: Negative for photophobia and visual disturbance.  Respiratory: Negative for apnea, chest tightness and shortness of breath.   Cardiovascular: Negative for chest pain, palpitations and leg swelling.  Gastrointestinal: Negative for abdominal pain, diarrhea and constipation.  Genitourinary: Negative for dysuria and difficulty urinating.  Musculoskeletal:       Back pain, b/l knee pain  Neurological: Negative for dizziness, seizures, syncope and light-headedness.  Hematological: Negative for adenopathy. Does not bruise/bleed easily.  Psychiatric/Behavioral: The patient is not nervous/anxious.     Blood pressure 138/82, pulse 80, temperature 97.1 F (36.2 C), temperature source Temporal, resp. rate 20, height 5' 7" (1.702 m), weight 252 lb (114.306 kg).  Physical Exam Physical Exam    Constitutional: She is oriented to person, place, and time. She appears well-developed and well-nourished. No distress.  HENT:  Head: Normocephalic and atraumatic.  Right Ear: External ear normal.  Left Ear: External ear normal.  Eyes: Conjunctivae normal are normal. No scleral icterus.  Neck: Normal range of motion. Neck supple. No tracheal deviation present.  Cardiovascular: Normal rate, regular rhythm and normal heart sounds.   Pulmonary/Chest: Effort normal and breath sounds normal. No respiratory distress. She has no wheezes.  Abdominal: Soft. Bowel sounds are normal. She exhibits no distension. There is no tenderness. There is no rebound.    Musculoskeletal: She exhibits no edema and no tenderness.  Lymphadenopathy:    She has no cervical adenopathy.  Neurological: She is alert and oriented to person, place, and time. She exhibits normal muscle tone.  Skin: Skin is warm and dry. No rash noted. She is not diaphoretic. No erythema.  Psychiatric: She has a normal mood and affect. Her behavior is normal. Judgment and thought content normal.    Data Reviewed My office note Dr Crenshaw's clearance note Dr Ingram's op note 15x19cm dual Goretex mesh - laparoscopic repair UGI- no hernia. No reflux   Assessment    Obesity BMI 39.47 Hypertension  CAD  Dislipidemia  Degenerative Disc Disease  Hypothyroid  Possible stress urinary incontinence  Fatty infiltration of liver     Plan    For Laparoscopic adjustable gastric band surgery on 12/9 Explained that surgery may take a little longer given her prior abdominal surgeries and mesh implantation. Explained that there may be adhesions that we may have to take down Reviewed operative and postoperative course Reviewed preop workup with pt Her Cr was slightly elevated in 02/2012 - will follow up on her preop labs. Encouraged her to stay hydrated Stop ASA, celebrex, fish oil All questions asked and answered  Trevionne Advani M. Damica Gravlin, MD,  FACS General, Bariatric, & Minimally Invasive Surgery [facility]        [name] M 05/17/2012, 10:18 AM    

## 2012-05-17 NOTE — Patient Instructions (Signed)
Stop taking aspirin, celebrex, and fish oil Continue with your preop diet Try to walk daily

## 2012-05-17 NOTE — Progress Notes (Signed)
EKG 03/28/12 on EPIC, CXR 04/05/12 on EPIC

## 2012-05-17 NOTE — Patient Instructions (Addendum)
20 [name]  05/17/2012   Your procedure is scheduled on: 05/22/12  Report to [facility] at [phone].  Call this number if you have problems the morning of surgery 336-: [phone]   Remember:   Do not eat food or drink liquids After Midnight.     Take these medicines the morning of surgery with A SIP OF WATER: wellbutrin, cymbalta, gabapentin, levothryoxine, lyrica.   Do not wear jewelry, make-up or nail polish.  Do not wear lotions, powders, or perfumes. You may wear deodorant.  Do not shave 48 hours prior to surgery. Men may shave face and neck.  Do not bring valuables to the hospital.  Contacts, dentures or bridgework may not be worn into surgery.  Leave suitcase in the car. After surgery it may be brought to your room.  For patients admitted to the hospital, checkout time is 11:00 AM the day of discharge.     Special Instructions: Shower using CHG 2 nights before surgery and the night before surgery.  If you shower the day of surgery use CHG.  Use special wash - you have one bottle of CHG for all showers.  You should use approximately 1/3 of the bottle for each shower.   Please read over the following fact sheets that you were given: MRSA Information.  Cain Sieve , RN  pre op nurse call if needed [phone]    FAILURE TO FOLLOW THESE INSTRUCTIONS MAY RESULT IN CANCELLATION OF YOUR SURGERY   Patient Signature: ___________________________________________

## 2012-05-18 ENCOUNTER — Encounter (HOSPITAL_COMMUNITY): Payer: Self-pay

## 2012-05-18 ENCOUNTER — Telehealth (INDEPENDENT_AMBULATORY_CARE_PROVIDER_SITE_OTHER): Payer: Self-pay | Admitting: General Surgery

## 2012-05-18 ENCOUNTER — Encounter (HOSPITAL_COMMUNITY)
Admission: RE | Admit: 2012-05-18 | Discharge: 2012-05-18 | Disposition: A | Discharge status: Home / Self Care | Payer: Medicare Other | Source: Ambulatory Visit | Attending: General Surgery | Admitting: General Surgery

## 2012-05-18 HISTORY — DX: Fibromyalgia: M79.7

## 2012-05-18 HISTORY — DX: Unspecified osteoarthritis, unspecified site: M19.90

## 2012-05-18 HISTORY — DX: Nausea with vomiting, unspecified: R11.2

## 2012-05-18 HISTORY — DX: Other specified postprocedural states: Z98.890

## 2012-05-18 LAB — COMPREHENSIVE METABOLIC PANEL
Albumin: 3.8 g/dL (ref 3.5–5.2)
BUN: 22 mg/dL (ref 6–23)
Chloride: 107 mEq/L (ref 96–112)
Creatinine, Ser: 1.2 mg/dL — ABNORMAL HIGH (ref 0.50–1.10)
Total Bilirubin: 0.3 mg/dL (ref 0.3–1.2)

## 2012-05-18 LAB — CBC WITH DIFFERENTIAL/PLATELET
Basophils Relative: 0 % (ref 0–1)
Eosinophils Absolute: 0.3 10*3/uL (ref 0.0–0.7)
HCT: 39 % (ref 36.0–46.0)
Hemoglobin: 12.9 g/dL (ref 12.0–15.0)
MCH: 29.3 pg (ref 26.0–34.0)
MCHC: 33.1 g/dL (ref 30.0–36.0)
Monocytes Absolute: 0.5 10*3/uL (ref 0.1–1.0)
Monocytes Relative: 7 % (ref 3–12)
Neutro Abs: 3.9 10*3/uL (ref 1.7–7.7)

## 2012-05-18 LAB — SURGICAL PCR SCREEN: Staphylococcus aureus: NEGATIVE

## 2012-05-18 NOTE — Telephone Encounter (Signed)
The pt called back and I gave her the message from [facility]

## 2012-05-18 NOTE — Telephone Encounter (Signed)
Left message on machine for patient to call back and ask for me. To make patient aware labs okay. It does show she is a little dehydrated and she should make sure she is drinking plenty of fluid prior to surgery. Awaiting patient's call back.

## 2012-05-18 NOTE — Telephone Encounter (Signed)
Message copied by Liliana Cline on Thu May 18, 2012  2:43 PM ------      Message from: Andrey Campanile, ERIC M      Created: Thu May 18, 2012  2:39 PM       Labs ok for surgery

## 2012-05-18 NOTE — Progress Notes (Signed)
05/18/12 1020  OBSTRUCTIVE SLEEP APNEA  Have you ever been diagnosed with sleep apnea through a sleep study? No  Do you snore loudly (loud enough to be heard through closed doors)?  1  Do you often feel tired, fatigued, or sleepy during the daytime? 1  Has anyone observed you stop breathing during your sleep? 0  Do you have, or are you being treated for high blood pressure? 1  BMI more than 35 kg/m2? 1  Age over 65 years old? 1  Neck circumference greater than 40 cm/18 inches? 0  Gender: 0  Obstructive Sleep Apnea Score 5   Score 4 or greater  Results sent to PCP

## 2012-05-18 NOTE — Progress Notes (Signed)
lov dr Jens Som acrdiology 03-28-2012 epic

## 2012-05-22 ENCOUNTER — Encounter (HOSPITAL_COMMUNITY): Payer: Self-pay | Admitting: Anesthesiology

## 2012-05-22 ENCOUNTER — Encounter (HOSPITAL_COMMUNITY)
Admission: RE | Disposition: A | Discharge status: Home / Self Care | Payer: Self-pay | Source: Ambulatory Visit | Attending: General Surgery

## 2012-05-22 ENCOUNTER — Encounter (HOSPITAL_COMMUNITY): Payer: Self-pay | Admitting: *Deleted

## 2012-05-22 ENCOUNTER — Ambulatory Visit (HOSPITAL_COMMUNITY): Payer: Medicare Other | Admitting: Anesthesiology

## 2012-05-22 ENCOUNTER — Inpatient Hospital Stay (HOSPITAL_COMMUNITY)
Admission: RE | Admit: 2012-05-22 | Discharge: 2012-05-23 | DRG: 621 | Disposition: A | Discharge status: Home / Self Care | Payer: Medicare Other | Source: Ambulatory Visit | Attending: General Surgery | Admitting: General Surgery

## 2012-05-22 ENCOUNTER — Inpatient Hospital Stay (HOSPITAL_COMMUNITY): Payer: Medicare Other

## 2012-05-22 DIAGNOSIS — I10 Essential (primary) hypertension: Secondary | ICD-10-CM

## 2012-05-22 DIAGNOSIS — K660 Peritoneal adhesions (postprocedural) (postinfection): Secondary | ICD-10-CM | POA: Diagnosis present

## 2012-05-22 DIAGNOSIS — I2510 Atherosclerotic heart disease of native coronary artery without angina pectoris: Secondary | ICD-10-CM

## 2012-05-22 DIAGNOSIS — E669 Obesity, unspecified: Secondary | ICD-10-CM | POA: Diagnosis present

## 2012-05-22 DIAGNOSIS — Z01812 Encounter for preprocedural laboratory examination: Secondary | ICD-10-CM

## 2012-05-22 DIAGNOSIS — F329 Major depressive disorder, single episode, unspecified: Secondary | ICD-10-CM | POA: Diagnosis present

## 2012-05-22 DIAGNOSIS — Z905 Acquired absence of kidney: Secondary | ICD-10-CM

## 2012-05-22 DIAGNOSIS — E785 Hyperlipidemia, unspecified: Secondary | ICD-10-CM | POA: Diagnosis present

## 2012-05-22 DIAGNOSIS — Z6839 Body mass index (BMI) 39.0-39.9, adult: Secondary | ICD-10-CM

## 2012-05-22 DIAGNOSIS — E039 Hypothyroidism, unspecified: Secondary | ICD-10-CM | POA: Diagnosis present

## 2012-05-22 DIAGNOSIS — Z6841 Body Mass Index (BMI) 40.0 and over, adult: Secondary | ICD-10-CM

## 2012-05-22 DIAGNOSIS — F3289 Other specified depressive episodes: Secondary | ICD-10-CM | POA: Diagnosis present

## 2012-05-22 DIAGNOSIS — K7689 Other specified diseases of liver: Secondary | ICD-10-CM | POA: Diagnosis present

## 2012-05-22 HISTORY — PX: LAPAROSCOPIC GASTRIC BANDING: SHX1100

## 2012-05-22 HISTORY — PX: MESH APPLIED TO LAP PORT: SHX5969

## 2012-05-22 HISTORY — PX: LAPAROSCOPIC LYSIS OF ADHESIONS: SHX5905

## 2012-05-22 LAB — CBC
MCH: 29.9 pg (ref 26.0–34.0)
MCHC: 34.1 g/dL (ref 30.0–36.0)
MCV: 87.6 fL (ref 78.0–100.0)
Platelets: 199 10*3/uL (ref 150–400)
RDW: 13.5 % (ref 11.5–15.5)
WBC: 9.5 10*3/uL (ref 4.0–10.5)

## 2012-05-22 SURGERY — GASTRIC BANDING, LAPAROSCOPIC
Anesthesia: General | Site: Abdomen | Wound class: Clean

## 2012-05-22 MED ORDER — LEVOTHYROXINE SODIUM 75 MCG PO TABS
75.0000 ug | ORAL_TABLET | Freq: Every day | ORAL | Status: DC
  Administered 2012-05-23: 75 ug via ORAL
  Filled 2012-05-22 (×2): qty 1

## 2012-05-22 MED ORDER — UNJURY CHOCOLATE CLASSIC POWDER
2.0000 [oz_av] | Freq: Four times a day (QID) | ORAL | Status: DC
  Administered 2012-05-23: 2 [oz_av] via ORAL

## 2012-05-22 MED ORDER — ENOXAPARIN SODIUM 40 MG/0.4ML ~~LOC~~ SOLN
40.0000 mg | SUBCUTANEOUS | Status: DC
  Administered 2012-05-23: 40 mg via SUBCUTANEOUS
  Filled 2012-05-22 (×2): qty 0.4

## 2012-05-22 MED ORDER — ONDANSETRON HCL 4 MG/2ML IJ SOLN: 4.0000 mg | INTRAMUSCULAR | Status: DC | PRN

## 2012-05-22 MED ORDER — NEOSTIGMINE METHYLSULFATE 1 MG/ML IJ SOLN
INTRAMUSCULAR | Status: DC | PRN
  Administered 2012-05-22: 5 mg via INTRAVENOUS

## 2012-05-22 MED ORDER — INFLUENZA VIRUS VACC SPLIT PF IM SUSP
0.5000 mL | INTRAMUSCULAR | Status: AC
  Administered 2012-05-23: 0.5 mL via INTRAMUSCULAR
  Filled 2012-05-22: qty 0.5

## 2012-05-22 MED ORDER — LACTATED RINGERS IV SOLN: INTRAVENOUS | Status: DC

## 2012-05-22 MED ORDER — UNJURY VANILLA POWDER: 2.0000 [oz_av] | Freq: Four times a day (QID) | ORAL | Status: DC

## 2012-05-22 MED ORDER — CLONAZEPAM 1 MG PO TABS
1.0000 mg | ORAL_TABLET | Freq: Every evening | ORAL | Status: DC | PRN
  Administered 2012-05-22: 1 mg via ORAL
  Filled 2012-05-22: qty 1

## 2012-05-22 MED ORDER — LISINOPRIL 20 MG PO TABS
20.0000 mg | ORAL_TABLET | Freq: Every day | ORAL | Status: DC
  Administered 2012-05-23: 20 mg via ORAL
  Filled 2012-05-22 (×2): qty 1

## 2012-05-22 MED ORDER — DEXTROSE 5 % IV SOLN
2.0000 g | INTRAVENOUS | Status: AC
  Administered 2012-05-22: 2 g via INTRAVENOUS

## 2012-05-22 MED ORDER — MORPHINE SULFATE 2 MG/ML IJ SOLN
2.0000 mg | INTRAMUSCULAR | Status: DC | PRN
  Administered 2012-05-22 (×2): 2 mg via INTRAVENOUS
  Filled 2012-05-22 (×3): qty 1

## 2012-05-22 MED ORDER — EPHEDRINE SULFATE 50 MG/ML IJ SOLN
INTRAMUSCULAR | Status: DC | PRN
  Administered 2012-05-22 (×2): 5 mg via INTRAVENOUS
  Administered 2012-05-22: 7.5 mg via INTRAVENOUS
  Administered 2012-05-22: 10 mg via INTRAVENOUS
  Administered 2012-05-22: 5 mg via INTRAVENOUS
  Administered 2012-05-22: 10 mg via INTRAVENOUS
  Administered 2012-05-22: 5 mg via INTRAVENOUS
  Administered 2012-05-22: 12.5 mg via INTRAVENOUS

## 2012-05-22 MED ORDER — CISATRACURIUM BESYLATE (PF) 10 MG/5ML IV SOLN
INTRAVENOUS | Status: DC | PRN
  Administered 2012-05-22: 10 mg via INTRAVENOUS
  Administered 2012-05-22: 4 mg via INTRAVENOUS

## 2012-05-22 MED ORDER — DULOXETINE HCL 30 MG PO CPEP
30.0000 mg | ORAL_CAPSULE | Freq: Three times a day (TID) | ORAL | Status: DC
  Administered 2012-05-22 – 2012-05-23 (×3): 30 mg via ORAL
  Filled 2012-05-22 (×5): qty 1

## 2012-05-22 MED ORDER — HEPARIN SODIUM (PORCINE) 5000 UNIT/ML IJ SOLN
5000.0000 [IU] | INTRAMUSCULAR | Status: AC
  Administered 2012-05-22: 5000 [IU] via SUBCUTANEOUS
  Filled 2012-05-22: qty 1

## 2012-05-22 MED ORDER — 0.9 % SODIUM CHLORIDE (POUR BTL) OPTIME
TOPICAL | Status: DC | PRN
  Administered 2012-05-22: 1000 mL

## 2012-05-22 MED ORDER — ACETAMINOPHEN 160 MG/5ML PO SOLN: 650.0000 mg | ORAL | Status: DC | PRN

## 2012-05-22 MED ORDER — MIDAZOLAM HCL 5 MG/5ML IJ SOLN
INTRAMUSCULAR | Status: DC | PRN
  Administered 2012-05-22: 2 mg via INTRAVENOUS

## 2012-05-22 MED ORDER — ACETAMINOPHEN 10 MG/ML IV SOLN
INTRAVENOUS | Status: DC | PRN
  Administered 2012-05-22: 1000 mg via INTRAVENOUS

## 2012-05-22 MED ORDER — KCL IN DEXTROSE-NACL 20-5-0.45 MEQ/L-%-% IV SOLN
INTRAVENOUS | Status: DC
  Administered 2012-05-22 – 2012-05-23 (×4): via INTRAVENOUS
  Filled 2012-05-22 (×4): qty 1000

## 2012-05-22 MED ORDER — PREGABALIN 75 MG PO CAPS
75.0000 mg | ORAL_CAPSULE | Freq: Two times a day (BID) | ORAL | Status: DC
  Administered 2012-05-22 – 2012-05-23 (×3): 75 mg via ORAL
  Filled 2012-05-22 (×3): qty 1

## 2012-05-22 MED ORDER — PROPOFOL 10 MG/ML IV BOLUS
INTRAVENOUS | Status: DC | PRN
  Administered 2012-05-22: 160 mg via INTRAVENOUS

## 2012-05-22 MED ORDER — UNJURY CHICKEN SOUP POWDER: 2.0000 [oz_av] | Freq: Four times a day (QID) | ORAL | Status: DC

## 2012-05-22 MED ORDER — HYDROMORPHONE HCL PF 1 MG/ML IJ SOLN
0.2500 mg | INTRAMUSCULAR | Status: DC | PRN
Start: 2012-05-22 — End: 2012-05-22

## 2012-05-22 MED ORDER — DEXAMETHASONE SODIUM PHOSPHATE 10 MG/ML IJ SOLN
INTRAMUSCULAR | Status: DC | PRN
  Administered 2012-05-22: 10 mg via INTRAVENOUS

## 2012-05-22 MED ORDER — DOXEPIN HCL 25 MG PO CAPS
25.0000 mg | ORAL_CAPSULE | Freq: Every day | ORAL | Status: DC
  Administered 2012-05-22: 25 mg via ORAL
  Filled 2012-05-22 (×2): qty 1

## 2012-05-22 MED ORDER — OXYCODONE-ACETAMINOPHEN 5-325 MG/5ML PO SOLN: 5.0000 mL | ORAL | Status: DC | PRN

## 2012-05-22 MED ORDER — BUPIVACAINE-EPINEPHRINE 0.25% -1:200000 IJ SOLN
INTRAMUSCULAR | Status: DC | PRN
  Administered 2012-05-22: 30 mL

## 2012-05-22 MED ORDER — BUPROPION HCL ER (XL) 150 MG PO TB24
150.0000 mg | ORAL_TABLET | Freq: Every day | ORAL | Status: DC
  Filled 2012-05-22 (×2): qty 1

## 2012-05-22 MED ORDER — LACTATED RINGERS IV SOLN
INTRAVENOUS | Status: DC
  Administered 2012-05-22 (×2): via INTRAVENOUS
  Administered 2012-05-22: 1000 mL via INTRAVENOUS
  Administered 2012-05-22: 12:00:00 via INTRAVENOUS

## 2012-05-22 MED ORDER — FENTANYL CITRATE 0.05 MG/ML IJ SOLN
INTRAMUSCULAR | Status: DC | PRN
  Administered 2012-05-22: 100 ug via INTRAVENOUS

## 2012-05-22 MED ORDER — GABAPENTIN 300 MG PO CAPS
300.0000 mg | ORAL_CAPSULE | Freq: Every day | ORAL | Status: DC
  Administered 2012-05-22: 300 mg via ORAL
  Filled 2012-05-22 (×2): qty 1

## 2012-05-22 MED ORDER — SODIUM CHLORIDE 0.9 % IJ SOLN
INTRAMUSCULAR | Status: DC | PRN
  Administered 2012-05-22: 20 mL via INTRAVENOUS

## 2012-05-22 MED ORDER — ONDANSETRON HCL 4 MG/2ML IJ SOLN
INTRAMUSCULAR | Status: DC | PRN
  Administered 2012-05-22: 4 mg via INTRAVENOUS

## 2012-05-22 MED ORDER — ACETAMINOPHEN 10 MG/ML IV SOLN: INTRAVENOUS | Status: DC | PRN

## 2012-05-22 MED ORDER — GLYCOPYRROLATE 0.2 MG/ML IJ SOLN
INTRAMUSCULAR | Status: DC | PRN
  Administered 2012-05-22: 0.4 mg via INTRAVENOUS
  Administered 2012-05-22: .8 mg via INTRAVENOUS

## 2012-05-22 SURGICAL SUPPLY — 60 items
ADH SKN CLS APL DERMABOND .7 (GAUZE/BANDAGES/DRESSINGS) ×3
APL SKNCLS STERI-STRIP NONHPOA (GAUZE/BANDAGES/DRESSINGS)
BAND LAP 10.0 W/TUBES (Band) ×1 IMPLANT
BENZOIN TINCTURE PRP APPL 2/3 (GAUZE/BANDAGES/DRESSINGS) IMPLANT
BLADE HEX COATED 2.75 (ELECTRODE) ×4 IMPLANT
BLADE SURG 15 STRL LF DISP TIS (BLADE) ×3 IMPLANT
BLADE SURG 15 STRL SS (BLADE) ×4
BLADE SURG SZ11 CARB STEEL (BLADE) ×3 IMPLANT
CANISTER SUCTION 2500CC (MISCELLANEOUS) ×4 IMPLANT
CHLORAPREP W/TINT 26ML (MISCELLANEOUS) ×4 IMPLANT
CLOTH BEACON ORANGE TIMEOUT ST (SAFETY) ×4 IMPLANT
DECANTER SPIKE VIAL GLASS SM (MISCELLANEOUS) ×5 IMPLANT
DERMABOND ADVANCED (GAUZE/BANDAGES/DRESSINGS) ×1
DERMABOND ADVANCED .7 DNX12 (GAUZE/BANDAGES/DRESSINGS) IMPLANT
DEVICE SUT QUICK LOAD TK 5 (STAPLE) ×12 IMPLANT
DEVICE SUT TI-KNOT TK 5X26 (MISCELLANEOUS) ×4 IMPLANT
DEVICE SUTURE ENDOST 10MM (ENDOMECHANICALS) IMPLANT
DISSECTOR BLUNT TIP ENDO 5MM (MISCELLANEOUS) IMPLANT
DRAPE CAMERA CLOSED 9X96 (DRAPES) ×4 IMPLANT
DRAPE UTILITY XL STRL (DRAPES) ×10 IMPLANT
ELECT REM PT RETURN 9FT ADLT (ELECTROSURGICAL) ×4
ELECTRODE REM PT RTRN 9FT ADLT (ELECTROSURGICAL) ×3 IMPLANT
GLOVE BIO SURGEON STRL SZ7.5 (GLOVE) ×4 IMPLANT
GLOVE BIOGEL M STRL SZ7.5 (GLOVE) IMPLANT
GLOVE INDICATOR 8.0 STRL GRN (GLOVE) ×4 IMPLANT
GOWN STRL NON-REIN LRG LVL3 (GOWN DISPOSABLE) ×3 IMPLANT
GOWN STRL REIN XL XLG (GOWN DISPOSABLE) ×11 IMPLANT
HOVERMATT SINGLE USE (MISCELLANEOUS) ×4 IMPLANT
KIT BASIN OR (CUSTOM PROCEDURE TRAY) ×4 IMPLANT
MESH HERNIA 1X4 RECT BARD (Mesh General) IMPLANT
MESH HERNIA BARD 1X4 (Mesh General) ×1 IMPLANT
NDL SPNL 22GX3.5 QUINCKE BK (NEEDLE) ×3 IMPLANT
NEEDLE SPNL 22GX3.5 QUINCKE BK (NEEDLE) ×4 IMPLANT
NS IRRIG 1000ML POUR BTL (IV SOLUTION) ×4 IMPLANT
PACK UNIVERSAL I (CUSTOM PROCEDURE TRAY) ×4 IMPLANT
PENCIL BUTTON HOLSTER BLD 10FT (ELECTRODE) ×4 IMPLANT
SCALPEL HARMONIC ACE (MISCELLANEOUS) ×1 IMPLANT
SET IRRIG TUBING LAPAROSCOPIC (IRRIGATION / IRRIGATOR) IMPLANT
SOLUTION ANTI FOG 6CC (MISCELLANEOUS) ×4 IMPLANT
SPONGE LAP 18X18 X RAY DECT (DISPOSABLE) ×4 IMPLANT
STAPLER VISISTAT 35W (STAPLE) IMPLANT
STRIP CLOSURE SKIN 1/2X4 (GAUZE/BANDAGES/DRESSINGS) IMPLANT
SUT ETHIBOND 2 0 SH (SUTURE) ×12
SUT ETHIBOND 2 0 SH 36X2 (SUTURE) ×9 IMPLANT
SUT MNCRL AB 4-0 PS2 18 (SUTURE) ×4 IMPLANT
SUT PROLENE 2 0 CT2 30 (SUTURE) ×4 IMPLANT
SUT SILK 0 (SUTURE) ×4
SUT SILK 0 30XBRD TIE 6 (SUTURE) ×3 IMPLANT
SUT SURGIDAC NAB ES-9 0 48 120 (SUTURE) IMPLANT
SUT VIC AB 2-0 SH 27 (SUTURE) ×4
SUT VIC AB 2-0 SH 27X BRD (SUTURE) ×3 IMPLANT
SYR 20CC LL (SYRINGE) ×4 IMPLANT
SYR CONTROL 10ML LL (SYRINGE) ×4 IMPLANT
SYS KII OPTICAL ACCESS 15MM (TROCAR) ×4
SYSTEM KII OPTICAL ACCESS 15MM (TROCAR) ×3 IMPLANT
TOWEL OR 17X26 10 PK STRL BLUE (TOWEL DISPOSABLE) ×5 IMPLANT
TROCAR BLADELESS OPT 5 100 (ENDOMECHANICALS) ×4 IMPLANT
TROCAR Z-THREAD FIOS 5X100MM (TROCAR) ×8 IMPLANT
TUBE CALIBRATION LAPBAND (TUBING) ×4 IMPLANT
TUBING INSUFFLATION 10FT LAP (TUBING) ×4 IMPLANT

## 2012-05-22 NOTE — Interval H&P Note (Signed)
History and Physical Interval Note:  05/22/2012 10:26 AM  [name]  has presented today for surgery, with the diagnosis of morbid obesity   The various methods of treatment have been discussed with the patient and family. After consideration of risks, benefits and other options for treatment, the patient has consented to  Procedure(s) (LRB) with comments: LAPAROSCOPIC GASTRIC BANDING (N/A) as a surgical intervention .  The patient's history has been reviewed, patient examined, no change in status, stable for surgery.  I have reviewed the patient's chart and labs.  Questions were answered to the patient's satisfaction.    [name]. Andrey Campanile, MD, FACS General, Bariatric, & Minimally Invasive Surgery [facility], [place]   [facility] M

## 2012-05-22 NOTE — Transfer of Care (Signed)
Immediate Anesthesia Transfer of Care Note  Patient: [name]  Procedure(s) Performed: Procedure(s) (LRB) with comments: LAPAROSCOPIC GASTRIC BANDING (N/A) - with mesh to bandport LAPAROSCOPIC LYSIS OF ADHESIONS (N/A) - For 30 minutes MESH APPLIED TO LAP PORT ()  Patient Location: PACU  Anesthesia Type:General  Level of Consciousness: awake, sedated and patient cooperative  Airway & Oxygen Therapy: Patient Spontanous Breathing and Patient connected to face mask oxygen  Post-op Assessment: Report given to PACU RN and Post -op Vital signs reviewed and stable  Post vital signs: Reviewed and stable  Complications: No apparent anesthesia complications

## 2012-05-22 NOTE — Progress Notes (Signed)
PACU NURSING;  Dentures obtained from spouse and returned to patient.  She placed them in her mouth.

## 2012-05-22 NOTE — H&P (View-Only) (Signed)
Patient ID: [name], female   DOB: 1946/01/18, 66 y.o.   MRN: [id number]  Chief Complaint  Patient presents with  . Bariatric Pre-op    lap band 05/22/2012    HPI [name] is a 66 y.o. female.  HPI 66 yo WF who I initially saw in early October for LAGB surgery evaluation comes in for her preop appt. She is currently scheduled for surgery on 12/9. She has started her preop diet but admits she slipped during Thanksgiving. She states that she likes the [employer] a lot.   PMHx, PSHx, SOCHx, FAMHx, ALL reviewed and unchanged  Past Medical History  Diagnosis Date  . Palpitations     history  . Depression   . Hyperlipidemia   . Hypertension   . Hypothyroidism   . CAD (coronary artery disease)   . Chronic kidney disease     normal kidney function; 1 kidney removed 2012  . Obesity (BMI 30-39.9)     Past Surgical History  Procedure Date  . Appendectomy   . Cholecystectomy   . Tonsillectomy   . Total knee arthroplasty 2003 (R); 2010 (L); 2012 (L clean-out)    Bilateral  . Laparoscopic incisional / umbilical / ventral hernia repair 2004    upper midline hernia, 15 x 19cm dual Goretex mesh  . Left nephrectomy 2012    noncancerous mass, [place]  . Total abdominal hysterectomy 1978    Family History  Problem Relation Age of Onset  . Adopted: Yes    Social History History  Substance Use Topics  . Smoking status: Never Smoker   . Smokeless tobacco: Not on file  . Alcohol Use: No    No Known Allergies  Current Outpatient Prescriptions  Medication Sig Dispense Refill  . aspirin 81 MG EC tablet Take 81 mg by mouth daily.        Marland Kitchen buPROPion (WELLBUTRIN XL) 150 MG 24 hr tablet Take 150 mg by mouth daily before breakfast.      . Calcium Carbonate-Vitamin D (CALTRATE 600+D) 600-400 MG-UNIT per tablet Take 1 tablet by mouth daily.        . celecoxib (CELEBREX) 200 MG capsule Take 200 mg by mouth 2 (two) times daily.      . Cholecalciferol (VITAMIN D3) 1000  UNITS CAPS Take 1 capsule by mouth daily.      . clonazePAM (KLONOPIN) 2 MG tablet Take 2 mg by mouth at bedtime.      Marland Kitchen doxepin (SINEQUAN) 25 MG capsule Take 25 mg by mouth at bedtime.      . DULoxetine (CYMBALTA) 30 MG capsule Take 30 mg by mouth 3 (three) times daily.        Marland Kitchen gabapentin (NEURONTIN) 300 MG capsule Take 300 mg by mouth at bedtime.       Marland Kitchen levothyroxine (SYNTHROID, LEVOTHROID) 75 MCG tablet Take 75 mcg by mouth daily before breakfast.       . lisinopril (PRINIVIL,ZESTRIL) 20 MG tablet Take 20 mg by mouth daily before breakfast.       . NON FORMULARY 1 capsule daily. MEGA RED CAP      . pregabalin (LYRICA) 75 MG capsule Take 75 mg by mouth 2 (two) times daily.      Marland Kitchen pyridOXINE (VITAMIN B-6) 100 MG tablet Take 100 mg by mouth daily.      . rosuvastatin (CRESTOR) 20 MG tablet Take 20 mg by mouth at bedtime.       . vitamin B-12 (CYANOCOBALAMIN)  1000 MCG tablet Take 1,000 mcg by mouth daily.        Review of Systems Review of Systems  Constitutional: Negative for fever, activity change, appetite change and unexpected weight change.  HENT: Negative for nosebleeds and congestion.   Eyes: Negative for photophobia and visual disturbance.  Respiratory: Negative for apnea, chest tightness and shortness of breath.   Cardiovascular: Negative for chest pain, palpitations and leg swelling.  Gastrointestinal: Negative for abdominal pain, diarrhea and constipation.  Genitourinary: Negative for dysuria and difficulty urinating.  Musculoskeletal:       Back pain, b/l knee pain  Neurological: Negative for dizziness, seizures, syncope and light-headedness.  Hematological: Negative for adenopathy. Does not bruise/bleed easily.  Psychiatric/Behavioral: The patient is not nervous/anxious.     Blood pressure 138/82, pulse 80, temperature 97.1 F (36.2 C), temperature source Temporal, resp. rate 20, height 5\' 7"  (1.702 m), weight 252 lb (114.306 kg).  Physical Exam Physical Exam    Constitutional: She is oriented to person, place, and time. She appears well-developed and well-nourished. No distress.  HENT:  Head: Normocephalic and atraumatic.  Right Ear: External ear normal.  Left Ear: External ear normal.  Eyes: Conjunctivae normal are normal. No scleral icterus.  Neck: Normal range of motion. Neck supple. No tracheal deviation present.  Cardiovascular: Normal rate, regular rhythm and normal heart sounds.   Pulmonary/Chest: Effort normal and breath sounds normal. No respiratory distress. She has no wheezes.  Abdominal: Soft. Bowel sounds are normal. She exhibits no distension. There is no tenderness. There is no rebound.    Musculoskeletal: She exhibits no edema and no tenderness.  Lymphadenopathy:    She has no cervical adenopathy.  Neurological: She is alert and oriented to person, place, and time. She exhibits normal muscle tone.  Skin: Skin is warm and dry. No rash noted. She is not diaphoretic. No erythema.  Psychiatric: She has a normal mood and affect. Her behavior is normal. Judgment and thought content normal.    Data Reviewed My office note Dr Ludwig Clarks clearance note Dr Jacinto Halim op note 15x19cm dual Goretex mesh - laparoscopic repair UGI- no hernia. No reflux   Assessment    Obesity BMI 39.47 Hypertension  CAD  Dislipidemia  Degenerative Disc Disease  Hypothyroid  Possible stress urinary incontinence  Fatty infiltration of liver     Plan    For Laparoscopic adjustable gastric band surgery on 12/9 Explained that surgery may take a little longer given her prior abdominal surgeries and mesh implantation. Explained that there may be adhesions that we may have to take down Reviewed operative and postoperative course Reviewed preop workup with pt Her Cr was slightly elevated in 02/2012 - will follow up on her preop labs. Encouraged her to stay hydrated Stop ASA, celebrex, fish oil All questions asked and answered  [name]. Andrey Campanile, MD,  FACS General, Bariatric, & Minimally Invasive Surgery [facility], [place]        [facility] M 05/17/2012, 10:18 AM

## 2012-05-22 NOTE — Anesthesia Postprocedure Evaluation (Signed)
  Anesthesia Post-op Note  Patient: [name]  Procedure(s) Performed: Procedure(s) (LRB): LAPAROSCOPIC GASTRIC BANDING (N/A) LAPAROSCOPIC LYSIS OF ADHESIONS (N/A) MESH APPLIED TO LAP PORT ()  Patient Location: PACU  Anesthesia Type: General  Level of Consciousness: awake and alert   Airway and Oxygen Therapy: Patient Spontanous Breathing  Post-op Pain: mild  Post-op Assessment: Post-op Vital signs reviewed, Patient's Cardiovascular Status Stable, Respiratory Function Stable, Patent Airway and No signs of Nausea or vomiting  Last Vitals:  Filed Vitals:   05/22/12 1330  BP:   Pulse: 76  Temp: 36.6 C  Resp: 16    Post-op Vital Signs: stable   Complications: No apparent anesthesia complications

## 2012-05-22 NOTE — Anesthesia Preprocedure Evaluation (Addendum)
Anesthesia Evaluation  Patient identified by MRN, date of birth, ID band Patient awake    Reviewed: Allergy & Precautions, H&P , NPO status , Patient's Chart, lab work & pertinent test results  History of Anesthesia Complications (+) PONV  Airway Mallampati: II TM Distance: >3 FB Neck ROM: full    Dental  (+) Edentulous Upper, Missing and Dental Advisory Given,    Pulmonary shortness of breath and with exertion,  breath sounds clear to auscultation  Pulmonary exam normal       Cardiovascular hypertension, Pt. on medications + CAD Rhythm:regular Rate:Normal  palpitations   Neuro/Psych negative neurological ROS  negative psych ROS   GI/Hepatic negative GI ROS, Neg liver ROS,   Endo/Other  negative endocrine ROSHypothyroidism   Renal/GU negative Renal ROS  negative genitourinary   Musculoskeletal   Abdominal   Peds  Hematology negative hematology ROS (+)   Anesthesia Other Findings   Reproductive/Obstetrics negative OB ROS                          Anesthesia Physical Anesthesia Plan  ASA: III  Anesthesia Plan: General   Post-op Pain Management:    Induction: Intravenous  Airway Management Planned: Oral ETT  Additional Equipment:   Intra-op Plan:   Post-operative Plan: Extubation in OR  Informed Consent: I have reviewed the patients History and Physical, chart, labs and discussed the procedure including the risks, benefits and alternatives for the proposed anesthesia with the patient or authorized representative who has indicated his/her understanding and acceptance.   Dental Advisory Given  Plan Discussed with: CRNA and Surgeon  Anesthesia Plan Comments:         Anesthesia Quick Evaluation

## 2012-05-22 NOTE — Op Note (Signed)
Laparoscopic Adjustable Gastric Band Placement & Laparoscopic lysis of adhesions Operative Note   Pre-operative Diagnosis:  Obesity BMI 40 Hypertension  CAD  Dislipidemia  Degenerative Disc Disease  Hypothyroid  Possible stress urinary incontinence  Fatty infiltration of liver   Post-operative Diagnosis: same  Surgeon: Atilano Ina   Assistants: Ovidio Kin, MD  Anesthesia: General endotracheal anesthesia  ASA Class: 3   Anesthesia: General plus local  Indications: 66 yo WF with Obesity unresponsive to medical treatment.  Findings: Omental adhesions to anterior abdominal and old abdominal wall mesh AP - Standard band   Procedure Details  The patient was seen in the Holding Room. The risks, benefits, complications, treatment options, and expected outcomes were discussed with the patient. The possibilities of reaction to medication, pulmonary aspiration, perforation of viscus, bleeding, recurrent infection, the need for additional procedures, failure to diagnose a condition, and creating a complication requiring transfusion or operation were discussed with the patient. The patient concurred with the proposed plan, giving informed consent.   The patient was taken to Operating Room # 1 at [facility], identified as [name] and the procedure verified as Laparoscopic Adjustable Gastric Band Placement. A Time Out was held and the above information confirmed.  Full general anesthesia was induced with orotracheal intubation.  The patient was prepped and draped in a supine position. Appropriate antibiotics were given intravenously.  A 1cm incision was made 2 fingerbreadths below the left subcostal margin laterally given her prior abdominal surgery and prior upper midline ventral hernia repair 10 years ago . Opitview technique was used to gain entry to the abdominal cavity. A 5 mm blunt trocar was advanced under direct vision through the abdominal wall with a 0 degree  scope. The abdomen was insufflated, the laparoscope introduced.  There were omental adhesions to the old mesh and abdominal wall on diagnostic laparoscopy. Another 5 mm trocar was placed on the left laterally at the level of the umbilicus. The adhesions were taken down with endoshears with & without cautery as well as with Harmonic scalpel. There was some bleeding the adhesions to the falciform ligament. The mesh was intact with metal tacks. It took roughly 30 minutes to fully takedown the adhesions.  Trocars were placed under direct vision in the following fashion: a 5mm trocar in the lateral right upper quadrant, a 15mm trocar in the right upper quadrant, a 5mm trocar in the high epigastrium, and one 5mm trocar slightly above and to the left of the umbilicus. The patient was placed in reverse trendelenburg position.  The [facility] liver retractor was then placed through the high epigastric trocar site and positioned to hold the liver.   The angle of His was identified and the left crus was dissected free.  There was no evidence of a hiatal hernia. Approximately 8cm below the angle of His on the lesser curvature, passing through the pars flaccida and preserving the vagus nerve, a blunt instrument was gently passed anterior to the right crus and behind the gastro-esophageal junction without difficulty. Care was taken to minimize posterior dissection in order to prevent a posterior slip.   A AP-Standard Lap Band had been introduced into the abdominal cavity through the 15mm trocar and carried around the gastro-esophageal junction and locked onto itself. Three interrupted 2.0 Ethibond sutures (each secured with a titanium tie knot) were used to imbricate the anterior stomach to itself over the band to prevent anterior slippage.   The bowel was examined and there were no obvious lesions.  Hemostasis was verified. The liver retractor was removed under direct visualization. There was no evidence of liver injury.  The tubing from the band was brought out via the right upper quadrant 11mm trocar site. All trocars were then removed under direct vision. The skin incision was lengthened and a subcutaneous space was made to accommodate the port. A 1 inch square of vicryl mesh was anchored to the base of the port with 4 sutures. The port was attached to the tubing and then placed in the subcutaneous pocket.  The redundant tubing was advanced back into the abdominal cavity. Inverted interrupted deep dermal sutures using a 2-0 vicryl were placed.   The wounds were heavily irrigated. The skin incisions were closed with 4-0 monocryl. Dermabond was applied.   Instrument, sponge, and needle counts were correct prior to wound closure and at the conclusion of the case.          Complications:  None; patient tolerated the procedure well.                Condition: stable  [name]. Andrey Campanile, MD, FACS General, Bariatric, & Minimally Invasive Surgery [facility], [place]

## 2012-05-22 NOTE — Progress Notes (Signed)
Utilization review completed.  

## 2012-05-23 ENCOUNTER — Inpatient Hospital Stay (HOSPITAL_COMMUNITY): Payer: Medicare Other

## 2012-05-23 ENCOUNTER — Encounter (HOSPITAL_COMMUNITY): Payer: Self-pay | Admitting: General Surgery

## 2012-05-23 LAB — CBC WITH DIFFERENTIAL/PLATELET
Basophils Absolute: 0 10*3/uL (ref 0.0–0.1)
Basophils Relative: 0 % (ref 0–1)
Eosinophils Relative: 0 % (ref 0–5)
HCT: 39.3 % (ref 36.0–46.0)
MCH: 29.6 pg (ref 26.0–34.0)
MCHC: 33.3 g/dL (ref 30.0–36.0)
MCV: 88.7 fL (ref 78.0–100.0)
Monocytes Absolute: 0.4 10*3/uL (ref 0.1–1.0)
RDW: 13.8 % (ref 11.5–15.5)

## 2012-05-23 MED ORDER — OXYCODONE-ACETAMINOPHEN 5-325 MG/5ML PO SOLN: 5.0000 mL | ORAL | Status: DC | PRN

## 2012-05-23 MED FILL — Cefoxitin Sodium For IV Soln 1 GM: INTRAVENOUS | Qty: 1 | Status: AC

## 2012-05-23 NOTE — Progress Notes (Signed)
Dr. Andrey Campanile on floor states no need to wait for KUB results ok to start patient on protein shakes and if tolerates shakes may be discharged home.

## 2012-05-23 NOTE — Discharge Summary (Signed)
Physician Discharge Summary  Patient ID: [name] MRN: [id number] DOB/AGE: [birth year] 66 y.o.  Admit date: 05/22/2012 Discharge date: 05/23/2012  Admission Diagnoses: HYPOTHYROIDISM  HYPERLIPIDEMIA-MIXED  DEPRESSION  HYPERTENSION, BENIGN ESSENTIAL  CAD  Obesity (BMI 30-39.9)  Discharge Diagnoses:  Active Problems:  HYPOTHYROIDISM  HYPERLIPIDEMIA-MIXED  DEPRESSION  HYPERTENSION, BENIGN ESSENTIAL  CAD  Obesity (BMI 30-39.9)   Discharged Condition: good  Hospital Course: The patient was admitted for planned laparoscopic adjustable gastric band placement. She underwent the procedure and was kept overnight for observation. Her postoperative course was uneventful. Her vitals were stable. She was tolerating protein shakes on day of discharge.   Consults: None  Significant Diagnostic Studies: labs: cbc - hgb unchanged  Treatments: IV hydration, analgesia: acetaminophen, Morphine and roxicet and surgery: LAPAROSCOPIC LYSIS OF ADHESIONS FOR 30 MINUTES AND LAPAROSCOPIC ADJUSTABLE GASTRIC BAND PLACEMENT  Discharge Exam: Blood pressure 152/79, pulse 83, temperature 98.3 F (36.8 C), temperature source Oral, resp. rate 18, height 5\' 7"  (1.702 m), weight 250 lb 12.8 oz (113.762 kg), SpO2 100.00%. Alert, nad, smiling. cta b/l Reg Obese, soft, nd. Incisions c/d/i with dermabond No edema  Disposition: 01-Home or Self Care      Discharge Orders    Future Appointments: Provider: Department: Dept Phone: Center:   06/01/2012 10:15 AM Atilano Ina, MD,FACS [facility] Surgery, [place] [phone] None   06/05/2012 4:00 PM [facility]dm[facility] Post-Op Class [facility] [phone] NDM       Medication List     As of 05/23/2012  2:41 PM    STOP taking these medications         celecoxib 200 MG capsule   Commonly known as: CELEBREX      TAKE these medications         aspirin 81 MG EC tablet   Take 81 mg by mouth daily.      buPROPion  150 MG 24 hr tablet   Commonly known as: WELLBUTRIN XL   Take 150 mg by mouth daily before breakfast.      CALTRATE 600+D 600-400 MG-UNIT per tablet   Generic drug: Calcium Carbonate-Vitamin D   Take 1 tablet by mouth daily.      clonazePAM 2 MG tablet   Commonly known as: KLONOPIN   Take 2 mg by mouth at bedtime.      doxepin 25 MG capsule   Commonly known as: SINEQUAN   Take 25 mg by mouth at bedtime.      DULoxetine 30 MG capsule   Commonly known as: CYMBALTA   Take 30 mg by mouth 3 (three) times daily.      gabapentin 300 MG capsule   Commonly known as: NEURONTIN   Take 300 mg by mouth at bedtime.      levothyroxine 75 MCG tablet   Commonly known as: SYNTHROID, LEVOTHROID   Take 75 mcg by mouth daily before breakfast.      lisinopril 20 MG tablet   Commonly known as: PRINIVIL,ZESTRIL   Take 20 mg by mouth daily before breakfast.      NON FORMULARY   1 capsule daily. MEGA RED CAP      oxyCODONE-acetaminophen 5-325 MG/5ML solution   Commonly known as: ROXICET   Take 5-10 mLs by mouth every 4 (four) hours as needed.      pregabalin 75 MG capsule   Commonly known as: LYRICA   Take 75 mg by mouth 2 (two) times daily.      pyridOXINE  100 MG tablet   Commonly known as: VITAMIN B-6   Take 100 mg by mouth daily.      rosuvastatin 20 MG tablet   Commonly known as: CRESTOR   Take 20 mg by mouth at bedtime.      vitamin B-12 1000 MCG tablet   Commonly known as: CYANOCOBALAMIN   Take 1,000 mcg by mouth daily.      Vitamin D3 1000 UNITS Caps   Take 1 capsule by mouth daily.        Follow-up Information    Follow up with Atilano Ina, MD,FACS. On 06/01/2012. (10:15 AM)    Contact information:   [address] [place] [address] [phone]         Atilano Ina, MD [facility] Surgery, [place] Signed: Atilano Ina 05/23/2012, 2:41 PM

## 2012-05-23 NOTE — Progress Notes (Signed)
Pt just returning from xray; alert and oriented; VSS; denies any nausea or vomiting; denies burping; +flatus; denies BM; voiding without difficulty; ambulating in hallways without difficulty; using incentive spirometer as directed; denies any pain; tolerating water well; will advance to protein shakes; pt already has follow up appts with [facility] and [facility]Cs; aware of BELT program and support group ; discharge instructions reviewed and pt verbalized understanding of; questions answered. ADJUSTABLE GASTRIC BAND DISCHARGE INSTRUCTIONS  Drs. Fredrik Rigger, [place], [name], and [place] Call if you have any problems.   Call [phone] and ask for the surgeon on call.    If you need immediate assistance come to the ER at [facility]. Tell the ER personnel that you are a new post-op gastric banding patient. Signs and symptoms to report:   Severe vomiting or nausea. If you cannot tolerate clear liquids for longer than 1 day, you need to call your surgeon.    Abdominal pain which does not get better after taking your pain medication   Fever greater than 101 F degree   Difficulty breathing   Chest pain    Redness, swelling, drainage, or foul odor at incision sites    If your incisions open or pull apart   Swelling or pain in calf (lower leg)   Diarrhea, frequent watery, uncontrolled bowel movements.   Constipation, (no bowel movements for 3 days) if this occurs, Take Milk of Magnesia, 2 tablespoons by mouth, 3 times a day for 2 days if needed.  Call your doctor if constipation continues. Stop taking Milk of Magnesia once you have had a bowel movement. You may also use Miralax according to the label instructions.   Anything you consider "abnormal for you".   Normal side effects after Surgery:   Unable to sleep at night or concentrate   Irritability   Being tearful (crying) or depressed   These are common complaints, possibly related to your anesthesia, stress of surgery and change in lifestyle, that usually  go away a few weeks after surgery.  If these feelings continue, call your medical doctor.  Wound Care You may have surgical glue, steri-strips, or staples over your incisions after surgery.  Surgical glue:  Looks like a clear film over your incisions and will wear off gradually. Steri-strips: Strips of tape over your incisions. You may notice a yellowish color on the skin underneath the steri-strips. This is a substance used to make the steri-strips stick better. Do not pull the steri-strips off - let them fall off. Staples: Cherlynn Polo may be removed before you leave the hospital. If you go home with staples, call [facility] [phone]) for an appointment with your surgeon's nurse to have staples removed in 7 - 10 days. Showering: You may shower two days after your surgery unless otherwise instructed by your surgeon. Wash gently around wounds with warm soapy water, rinse well, and gently pat dry.  If you have a drain, you may need someone to hold this while you shower. Avoid tub baths until staples are removed and incisions are healed.    Medications   Medications should be liquid or crushed if larger than the size of a dime.  Extended release pills should not be crushed.   Depending on the size and number of medications you take, you may need to stagger/change the time you take your medications so that you do not over-fill your pouch.    Make sure you follow-up with your primary care physician to make medication adjustments needed during  rapid weight loss and life-style adjustment.   If you are diabetic, follow up with the doctor that prescribes your diabetes medication(s) within one week after surgery and check your blood sugar regularly.   Do not drive while taking narcotics!   Do not take acetaminophen (Tylenol) and Roxicet or Lortab Elixir at the same time since these pain medications contain acetaminophen.  Diet at home: (First 2 Weeks)  You will see the nutritionist two weeks  after your surgery. She will advance your diet if you are tolerating liquids well. Once at home, if you have severe vomiting or nausea and cannot tolerate clear liquids lasting longer than 1 day, call your surgeon.  For Same Day Surgery Discharge Patients: The day of surgery drink water only: 2 ounces every 4 hours. If you are tolerating water, begin drinking your high protein shake the next morning. For Overnight Stay Patients: Begin high protein shake 2 ounces every 3 hours, 5 - 6 times per day.  Gradually increase the amount you drink as tolerated.  You may find it easier to slowly sip shakes throughout the day.  It is important to get your proteins in first.   Protein Shake   Drink at least 2 ounces of shake 5-6 times per day   Each serving of protein shakes should have a minimum of 15 grams of protein and no more than 5 grams of carbohydrate    Increase the amount of protein shake you drink as tolerated   Protein powder may be added to fluids such as non-fat milk or Lactaid milk (limit to 20 grams added protein powder per serving   The initial goal is to drink at least 8 ounces of protein shake/drink per day (or as directed by the nutritionist). Some examples of protein shakes are [employer], [employer], EAS Edge HP, and [place]. Hydration   Gradually increase the amount of water and other liquids as tolerated (See Acceptable Fluids)   Gradually increase the amount of protein shake as tolerated     Sip fluids slowly and throughout the day   May use Sugar substitutes, use sparingly (limit to 6 - 8 packets per day).  Your fluid goal is 64 ounces of fluid daily. It may take a few weeks to build up to this.         32 oz (or more) should be clear liquids and 32 oz (or more) should be full liquids.         Liquids should not contain sugar, caffeine, or carbonation!  Acceptable Fluids Clear Liquids:   Water or Sugar-free flavored water, Fruit H2O   Decaffeinated coffee or tea  (sugar-free)   Crystal Lite, Wyler's Lite, Minute Maid Lite   Sugar-free Jell-O   Bouillon or broth   Sugar-free Popsicle:   *Less than 20 calories each; Limit 1 per day   Full Liquids:              Protein Shakes/Drinks + 2 choices per day of other full liquids shown below.    Other full liquids must be: No more than 12 grams of Carbs per serving,  No more than 3 grams of Fat per serving   Strained low-fat cream soup   Non-Fat milk   Fat-free Lactaid Milk   Sugar-free yogurt (Dannon Lite & Fit) Vitamins and Minerals (Start 1 day after surgery unless otherwise directed)   1 Chewable Multivitamin / Multimineral Supplement (i.e. Centrum for Adults)   Chewable Calcium Citrate with Vitamin D-3.  Take 1500 mg each day.           (Example: 3 Chewable Calcium Plus 600 with Vitamin D-3 can be found at [facility])           Do not mix multivitamins containing iron with calcium supplements; take 2 hours   apart   Do not substitute Tums (calcium carbonate) for your calcium   Menstruating women and those at risk for anemia may need extra iron. Talk with your doctor to see if you need additional iron.     If you need extra iron:  Total daily Iron recommendations (including Vitamins) = 50 - 100 mg Iron/day Do not stop taking or change any vitamins or minerals until you talk to your nutritionist or surgeon. Your nutritionist and / or physician must approve all vitamin and mineral supplements. Exercise For maximum success, begin exercising as soon as your doctor recommends. Make sure your physician approves any physical activity.   Depending on fitness level, begin with a simple walking program   Walk 5-15 minutes each day, 7 days per week.    Slowly increase until you are walking 30-45 minutes per day   Consider joining our BELT program. [phone] or email [email or web address] Things to remember:   You may have sexual relations when you feel comfortable. It is VERY important for female patients to use a  reliable birth control method. Fertility often increases after surgery. Do not get pregnant for at least 18 months.   It is very important to keep all follow up appointments with your surgeon, nutritionist, primary care physician, and behavioral health practitioner. After the first year, please follow up with your bariatric surgeon at least once a year in order to maintain best weight loss results.  [facility]: [phone] [facility]: [phone]   Free counseling is available for you and your family through collaboration between [facility] and [place]. Please call [phone] and leave a message.    Consider purchasing a medical alert bracelet that says you had lap-band surgery.    [facility] has a free Bariatric Surgery Support Group that meets monthly, the 3rd Thursday, 6 pm, Classroom #1, [place]. You may register online at [email or web address], but registration is not necessary. Select Classes and Support Groups, Bariatric Surgery, or Call [phone]   Do not return to work or drive until cleared by your surgeon   Use your CPAP when sleeping if applicable   Do not lift anything greater than ten pounds for at least two weeks.   You will probably have your first fill (fluid added to your band) 6 weeks after surgery  Joen Laura, RN Bariatric Nurse Coordinator

## 2012-05-23 NOTE — Progress Notes (Signed)
Patient discharged via wheel chair. Tolerating shakes and ambulating independently. O2 sat 98% on Ra. States understanding of discharge instructions and Rx for roxicet given.

## 2012-06-01 ENCOUNTER — Ambulatory Visit (INDEPENDENT_AMBULATORY_CARE_PROVIDER_SITE_OTHER): Payer: Medicare Other | Admitting: General Surgery

## 2012-06-01 ENCOUNTER — Encounter (INDEPENDENT_AMBULATORY_CARE_PROVIDER_SITE_OTHER): Payer: Self-pay | Admitting: General Surgery

## 2012-06-01 VITALS — BP 80/50 | HR 72 | Temp 97.6°F | Resp 18 | Ht 67.0 in | Wt 249.2 lb

## 2012-06-01 DIAGNOSIS — Z9884 Bariatric surgery status: Secondary | ICD-10-CM

## 2012-06-01 DIAGNOSIS — Z09 Encounter for follow-up examination after completed treatment for conditions other than malignant neoplasm: Secondary | ICD-10-CM

## 2012-06-01 DIAGNOSIS — I959 Hypotension, unspecified: Secondary | ICD-10-CM

## 2012-06-01 LAB — CBC WITH DIFFERENTIAL/PLATELET
Basophils Absolute: 0 10*3/uL (ref 0.0–0.1)
Basophils Relative: 0 % (ref 0–1)
Eosinophils Absolute: 0.4 10*3/uL (ref 0.0–0.7)
MCH: 30 pg (ref 26.0–34.0)
MCHC: 35.1 g/dL (ref 30.0–36.0)
Monocytes Absolute: 1.2 10*3/uL — ABNORMAL HIGH (ref 0.1–1.0)
Neutro Abs: 4.8 10*3/uL (ref 1.7–7.7)
Neutrophils Relative %: 56 % (ref 43–77)
RDW: 14.4 % (ref 11.5–15.5)

## 2012-06-01 LAB — BASIC METABOLIC PANEL
BUN: 98 mg/dL — ABNORMAL HIGH (ref 6–23)
Calcium: 9.6 mg/dL (ref 8.4–10.5)
Potassium: 4.9 mEq/L (ref 3.5–5.3)

## 2012-06-01 NOTE — Progress Notes (Signed)
Subjective:     Patient ID: Margarite Gouge, female   DOB: 1945/09/25, 66 y.o.   MRN: [id number]  HPI 66 year old Caucasian female comes in for followup after undergoing laparoscopic adjustable gastric band placement and laparoscopic lysis of adhesion surgery on December 9. She states that she has felt fatigued and tired since surgery. She is also very concerned about her lack of weight loss. She thought her weight loss would be much higher at this point. She states that all she has been drinking has been protein shakes about 5 per day as well as water in between to protein shakes. She denies any fever, chills, nausea, vomiting, regurgitation, diarrhea or constipation. She denies any melena or hematochezia. She denies any chest pain or chest pressure. She still has her chronic back pain which is limiting her activity. She is scheduled to see the nutritionist on Monday. She denies any lightheadedness or dizziness.  Review of Systems     Objective:   Physical Exam BP 80/50  Pulse 72  Temp 97.6 F (36.4 C)  Resp 18  Ht 5\' 7"  (1.702 m)  Wt 249 lb 3.2 oz (113.036 kg)  BMI 39.03 kg/m2  See LapBand flowsheet  Gen: alert, NAD, non-toxic appearing HEENT: normocephalic, atraumatic; pupils equal, no scleral icterus, neck supple, no lymphadenopathy Pulm: Lungs clear to auscultation, symmetric chest rise CV: regular rate and rhythm Abd: soft, nontender, nondistended. Well-healed trocar sites. No incisional hernia. Port is in right mid-abdomen Ext: no edema, normal, symmetric strength Neuro: nonfocal, sensation grossly intact Psych: appropriate, judgment normal     Assessment:     S/p LAGB    Plan:     She is nontoxic appearing. Her repeat blood pressure was 88/56. She is not having any symptoms of lightheadedness or dizziness. We will check a CBC and BMET today. I have advised her to stop taking her lisinopril for the next several days. I have asked her to record her blood pressure twice a  day and record it. We will contact her on Monday to see how her blood pressure readings have been performing. If she is still running low she will need to schedule a sooner followup appointment with her primary care physician.  She has lost about 3 pounds since surgery. I reminded her like we had discussed preoperatively weight loss will take longer with a laparoscopic adjustable gastric band then with the other weight loss surgery procedures. I explained that it is not uncommon not to have any restriction the first couple weeks after surgery. She really would like to have a band adjustment today. I told her I would not recommend it and that we needed to wait several more weeks before performing her first adjustment. I explained that while she has this fatigue I would be reluctant to add Fluid to her band. She is interested in being referred to the BELT program when she is 6 weeks out from surgery  followup 3 weeks.  [name]. Andrey Campanile, MD, FACS General, Bariatric, & Minimally Invasive Surgery [facility], [place]

## 2012-06-01 NOTE — Patient Instructions (Signed)
Take your blood pressure twice a day - once in morning and in evening and record Stop taking your blood pressure pill for the next several days You can start eating small portions of solid proteins like eggs, [place], chicken, fish, cottage cheese Keep drinking the protein shakes Get your labs checked

## 2012-06-02 ENCOUNTER — Inpatient Hospital Stay (HOSPITAL_COMMUNITY)
Admission: AD | Admit: 2012-06-02 | Discharge: 2012-06-04 | DRG: 684 | Disposition: A | Discharge status: Home / Self Care | Payer: Medicare Other | Source: Ambulatory Visit | Attending: General Surgery | Admitting: General Surgery

## 2012-06-02 ENCOUNTER — Encounter (HOSPITAL_COMMUNITY): Payer: Self-pay | Admitting: *Deleted

## 2012-06-02 ENCOUNTER — Other Ambulatory Visit (INDEPENDENT_AMBULATORY_CARE_PROVIDER_SITE_OTHER): Payer: Self-pay | Admitting: General Surgery

## 2012-06-02 ENCOUNTER — Telehealth (INDEPENDENT_AMBULATORY_CARE_PROVIDER_SITE_OTHER): Payer: Self-pay | Admitting: General Surgery

## 2012-06-02 DIAGNOSIS — Z905 Acquired absence of kidney: Secondary | ICD-10-CM

## 2012-06-02 DIAGNOSIS — I10 Essential (primary) hypertension: Secondary | ICD-10-CM | POA: Diagnosis present

## 2012-06-02 DIAGNOSIS — E039 Hypothyroidism, unspecified: Secondary | ICD-10-CM | POA: Diagnosis present

## 2012-06-02 DIAGNOSIS — I2510 Atherosclerotic heart disease of native coronary artery without angina pectoris: Secondary | ICD-10-CM | POA: Diagnosis present

## 2012-06-02 DIAGNOSIS — E669 Obesity, unspecified: Secondary | ICD-10-CM | POA: Diagnosis present

## 2012-06-02 DIAGNOSIS — Z6839 Body mass index (BMI) 39.0-39.9, adult: Secondary | ICD-10-CM

## 2012-06-02 DIAGNOSIS — E860 Dehydration: Secondary | ICD-10-CM | POA: Diagnosis present

## 2012-06-02 DIAGNOSIS — E782 Mixed hyperlipidemia: Secondary | ICD-10-CM | POA: Diagnosis present

## 2012-06-02 DIAGNOSIS — Z9884 Bariatric surgery status: Secondary | ICD-10-CM

## 2012-06-02 DIAGNOSIS — Z7982 Long term (current) use of aspirin: Secondary | ICD-10-CM

## 2012-06-02 DIAGNOSIS — Z96659 Presence of unspecified artificial knee joint: Secondary | ICD-10-CM

## 2012-06-02 DIAGNOSIS — F329 Major depressive disorder, single episode, unspecified: Secondary | ICD-10-CM | POA: Diagnosis present

## 2012-06-02 DIAGNOSIS — F3289 Other specified depressive episodes: Secondary | ICD-10-CM | POA: Diagnosis present

## 2012-06-02 DIAGNOSIS — N179 Acute kidney failure, unspecified: Principal | ICD-10-CM | POA: Diagnosis present

## 2012-06-02 LAB — COMPREHENSIVE METABOLIC PANEL
ALT: 23 U/L (ref 0–35)
Alkaline Phosphatase: 86 U/L (ref 39–117)
BUN: 85 mg/dL — ABNORMAL HIGH (ref 6–23)
CO2: 27 mEq/L (ref 19–32)
GFR calc Af Amer: 16 mL/min — ABNORMAL LOW (ref >90)
GFR calc non Af Amer: 14 mL/min — ABNORMAL LOW (ref >90)
Glucose, Bld: 130 mg/dL — ABNORMAL HIGH (ref 70–99)
Potassium: 4 mEq/L (ref 3.5–5.1)
Sodium: 141 mEq/L (ref 135–145)
Total Bilirubin: 0.2 mg/dL — ABNORMAL LOW (ref 0.3–1.2)

## 2012-06-02 LAB — MAGNESIUM: Magnesium: 2.6 mg/dL — ABNORMAL HIGH (ref 1.5–2.5)

## 2012-06-02 MED ORDER — BUPROPION HCL 75 MG PO TABS
75.0000 mg | ORAL_TABLET | Freq: Two times a day (BID) | ORAL | Status: DC
  Administered 2012-06-02 – 2012-06-04 (×4): 75 mg via ORAL
  Filled 2012-06-02 (×5): qty 1

## 2012-06-02 MED ORDER — HEPARIN SODIUM (PORCINE) 5000 UNIT/ML IJ SOLN
5000.0000 [IU] | Freq: Three times a day (TID) | INTRAMUSCULAR | Status: DC
  Administered 2012-06-02 – 2012-06-04 (×6): 5000 [IU] via SUBCUTANEOUS
  Filled 2012-06-02 (×9): qty 1

## 2012-06-02 MED ORDER — CLONAZEPAM 1 MG PO TABS
2.0000 mg | ORAL_TABLET | Freq: Every evening | ORAL | Status: DC | PRN
  Administered 2012-06-02: 2 mg via ORAL
  Filled 2012-06-02: qty 2

## 2012-06-02 MED ORDER — DOXEPIN HCL 50 MG PO CAPS
50.0000 mg | ORAL_CAPSULE | Freq: Every day | ORAL | Status: DC
  Administered 2012-06-02 – 2012-06-03 (×2): 50 mg via ORAL
  Filled 2012-06-02 (×3): qty 1

## 2012-06-02 MED ORDER — LEVOTHYROXINE SODIUM 75 MCG PO TABS
75.0000 ug | ORAL_TABLET | Freq: Every day | ORAL | Status: DC
  Administered 2012-06-03 – 2012-06-04 (×2): 75 ug via ORAL
  Filled 2012-06-02 (×4): qty 1

## 2012-06-02 MED ORDER — PANTOPRAZOLE SODIUM 40 MG IV SOLR
40.0000 mg | Freq: Every day | INTRAVENOUS | Status: DC
  Administered 2012-06-02: 40 mg via INTRAVENOUS
  Filled 2012-06-02 (×2): qty 40

## 2012-06-02 MED ORDER — VITAMIN D3 25 MCG (1000 UNIT) PO TABS
1000.0000 [IU] | ORAL_TABLET | Freq: Every day | ORAL | Status: DC
  Administered 2012-06-02 – 2012-06-03 (×2): 1000 [IU] via ORAL
  Filled 2012-06-02 (×3): qty 1

## 2012-06-02 MED ORDER — VITAMIN D3 25 MCG (1000 UT) PO CAPS: 1.0000 | ORAL_CAPSULE | Freq: Every day | ORAL | Status: DC

## 2012-06-02 MED ORDER — DULOXETINE HCL 30 MG PO CPEP
30.0000 mg | ORAL_CAPSULE | Freq: Three times a day (TID) | ORAL | Status: DC
  Administered 2012-06-02 – 2012-06-04 (×5): 30 mg via ORAL
  Filled 2012-06-02 (×7): qty 1

## 2012-06-02 MED ORDER — SODIUM CHLORIDE 0.9 % IV SOLN
INTRAVENOUS | Status: DC
  Administered 2012-06-02 (×2): 150 mL/h via INTRAVENOUS
  Administered 2012-06-03 (×4): via INTRAVENOUS

## 2012-06-02 MED ORDER — PREGABALIN 75 MG PO CAPS
75.0000 mg | ORAL_CAPSULE | Freq: Two times a day (BID) | ORAL | Status: DC
  Administered 2012-06-02 – 2012-06-04 (×4): 75 mg via ORAL
  Filled 2012-06-02 (×4): qty 1

## 2012-06-02 MED ORDER — POLYETHYLENE GLYCOL 3350 17 G PO PACK
17.0000 g | PACK | Freq: Every day | ORAL | Status: DC
  Administered 2012-06-02: 17 g via ORAL
  Filled 2012-06-02 (×3): qty 1

## 2012-06-02 MED ORDER — ONDANSETRON HCL 4 MG/2ML IJ SOLN: 4.0000 mg | Freq: Four times a day (QID) | INTRAMUSCULAR | Status: DC | PRN

## 2012-06-02 NOTE — Progress Notes (Signed)
pls see my office note from yesterday.   Briefly 66yo WF s/p Lap adjustable gastric band placement on 12/9 came into the office as scheduled yesterday with c/o fatigue. She had some low blood pressure as well. I ordered bmet and cbc. Unfortunately, BMET showed BUN 98 and Cr 3.93. Pt was called and told to go to [facility] for admission for acute kidney injury. I will see her later today.   AKI: Will initiate aggressive IVF therapy  Hold ace-inhibitor Probably pre-renal Pt is s/p left nephrectomy and only has 1 kidney Avoid nephrotoxic meds Check UA, urine lytes Renal consult  LAGB: Will order UGI to make sure nothing wrong with lapband  [name]. Andrey Campanile, MD, FACS General, Bariatric, & Minimally Invasive Surgery [facility], [place]

## 2012-06-02 NOTE — Telephone Encounter (Signed)
Spoke with patient - made her aware labs show she is dehydrated and needs IV fluids. Patient has a bed reserved at [facility] in [place] and she is aware to go now to [facility].

## 2012-06-02 NOTE — Consult Note (Signed)
[name] 06/02/2012 Deverick Pruss D Requesting Physician:  Dr Andrey Campanile  Reason for Consult:  Acute renal failure HPI: The patient is a 66 y.o. 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  Patient underwent lap band procedure on 12/9.  She had lab work done [employer] which showed an elevated Cr of 4.0 at Dr. Tawana Scale office.  Sent for direct admission. Patient is on lisinopril 20 mg qam x about 8 yrs. She is getting IVF"s. She recorded her BP low in the 80's for the last 3 days at home.  She was not lightheaded but very fatigued the last 3 days. Denies n/v/d/abd pain, cp, sob or dysuria at home.    ROS  no HA, visual chg  no MI or CVA   no hallucination or confusion  no joint pain or swelling  no rash or itching   Past Medical History:  Past Medical History  Diagnosis Date  . Palpitations     history  . Depression   . Hyperlipidemia   . Hypertension   . Hypothyroidism   . Obesity (BMI 30-39.9)   . CAD (coronary artery disease)   . Chronic kidney disease     normal kidney function; left kidney removed 2012  . Arthritis   . Fibromyalgia   . PONV (postoperative nausea and vomiting)     nausea only    Past Surgical History:  Past Surgical History  Procedure Date  . Appendectomy   . Cholecystectomy   . Tonsillectomy   . Total knee arthroplasty 2003 (R); 2010 (L); 2012 (L clean-out)    Bilateral  . Laparoscopic incisional / umbilical / ventral hernia repair 2004    upper midline hernia, 15 x 19cm dual Goretex mesh  . Left nephrectomy 2012    noncancerous mass, [place]  . Total abdominal hysterectomy 1978  . Laparoscopic gastric banding 05/22/2012    Procedure: LAPAROSCOPIC GASTRIC BANDING;  Surgeon: Atilano Ina, MD,FACS;  Location: [facility] ORS;  Service: General;  Laterality: N/A;  with mesh to bandport  . Laparoscopic lysis of adhesions 05/22/2012    Procedure: LAPAROSCOPIC LYSIS OF ADHESIONS;  Surgeon: Atilano Ina,  MD,FACS;  Location: [facility] ORS;  Service: General;  Laterality: N/A;  For 30 minutes  . Mesh applied to lap port 05/22/2012    Procedure: MESH APPLIED TO LAP PORT;  Surgeon: Atilano Ina, MD,FACS;  Location: [facility] ORS;  Service: General;;    Family History:  Family History  Problem Relation Age of Onset  . Adopted: Yes   Social History:  reports that she has never smoked. She has never used smokeless tobacco. She reports that she does not drink alcohol or use illicit drugs.  Allergies: No Known Allergies  Home medications: Prior to Admission medications   Medication Sig Start Date End Date Taking? Authorizing Provider  aspirin 81 MG EC tablet Take 81 mg by mouth every morning.    Yes Historical Provider, MD  buPROPion (WELLBUTRIN XL) 150 MG 24 hr tablet Take 150 mg by mouth daily before breakfast.   Yes Historical Provider, MD  Calcium Carbonate-Vitamin D (CALTRATE 600+D) 600-400 MG-UNIT per tablet Take 1 tablet by mouth every morning.    Yes Historical Provider, MD  Cholecalciferol (VITAMIN D3) 1000 UNITS CAPS Take 1 capsule by mouth daily after lunch.    Yes Historical Provider, MD  clonazePAM (KLONOPIN) 2 MG tablet Take 2 mg by mouth at bedtime.   Yes Historical  Provider, MD  doxepin (SINEQUAN) 25 MG capsule Take 50 mg by mouth at bedtime.    Yes Historical Provider, MD  DULoxetine (CYMBALTA) 30 MG capsule Take 30 mg by mouth 3 (three) times daily.    Yes Historical Provider, MD  levothyroxine (SYNTHROID, LEVOTHROID) 75 MCG tablet Take 75 mcg by mouth daily before breakfast.    Yes Historical Provider, MD  lisinopril (PRINIVIL,ZESTRIL) 20 MG tablet Take 20 mg by mouth daily before breakfast.    Yes Historical Provider, MD  NON FORMULARY Take 1 capsule by mouth daily at 3 pm. MEGA RED CAP   Yes Historical Provider, MD  pregabalin (LYRICA) 75 MG capsule Take 75 mg by mouth 2 (two) times daily.   Yes Historical Provider, MD  pyridOXINE (VITAMIN B-6) 100 MG tablet Take 100 mg by mouth every  morning.    Yes Historical Provider, MD  rosuvastatin (CRESTOR) 20 MG tablet Take 20 mg by mouth at bedtime.    Yes Historical Provider, MD  vitamin B-12 (CYANOCOBALAMIN) 1000 MCG tablet Take 1,000 mcg by mouth every evening.    Yes Historical Provider, MD  oxyCODONE-acetaminophen (ROXICET) 5-325 MG/5ML solution Take 5-10 mLs by mouth every 4 (four) hours as needed. Pain. 05/23/12   Atilano Ina, MD,FACS    Inpatient medications:    . heparin  5,000 Units Subcutaneous Q8H  . pantoprazole (PROTONIX) IV  40 mg Intravenous QHS    Labs: Basic Metabolic Panel:  Lab 06/02/12 6213 06/01/12 1100  NA 141 139  K 4.0 4.9  CL 104 96  CO2 27 26  GLUCOSE 130* 111*  BUN 85* 98*  CREATININE 3.19* 3.93*  ALB -- --  CALCIUM 9.6 9.6  PHOS 2.8 --   Liver Function Tests:  Lab 06/02/12 1305  AST 25  ALT 23  ALKPHOS 86  BILITOT 0.2*  PROT 6.9  ALBUMIN 3.7   No results found for this basename: LIPASE:3,AMYLASE:3 in the last 168 hours No results found for this basename: AMMONIA:3 in the last 168 hours CBC:  Lab 06/01/12 1100  WBC 8.6  NEUTROABS 4.8  HGB 13.2  HCT 37.6  MCV 85.5  PLT 258   PT/INR: @labrcntip (inr:5) Cardiac Enzymes: No results found for this basename: CKTOTAL:5,CKMB:5,CKMBINDEX:5,TROPONINI:5 in the last 168 hours CBG: No results found for this basename: GLUCAP:5 in the last 168 hours  Iron Studies: No results found for this basename: IRON:30,TIBC:30,TRANSFERRIN:30,FERRITIN:30 in the last 168 hours  Xrays/Other Studies: No results found.  Physical Exam:  Blood pressure 119/68, pulse 92, temperature 98.6 F (37 C), temperature source Oral, resp. rate 18, height 5\' 7"  (1.702 m), weight 112.946 kg (249 lb), SpO2 100.00%.  Gen: obese alert adult female in NAD Skin: no rash, cyanosis HEENT:  EOMI, sclera anicteric, throat clear and slightly moist Neck: no JVD, no LAN Chest: clear bilat to bases CV: regular, no rub or gallop, pedal pulses wnl Abdomen: soft,  obese, nontender, SQ port in R mid abdomen, no masses or HSM Ext: no LE edema, no UE or facial edema, no joint effusion or deformity, no gangrene or ulceration Neuro: alert, Ox3, no focal deficit, no asterixis  UA- neg protein, 0-2 wbc, no rbc's, many bact  Impression/Plan 1. Acute renal failure about 2 weeks after lap-band procedure 05/22/12-- cause is combination of factors including vol depletion/ACEI/hypotension/solitary kidney- last creat was 1.1 on 05/22/12, 3.9 today > down to 3.1 this afternoon.  Suspect she will improve promptly. Would continue IVF's at 150/hr. UA is benign. Get renal US.  2. Hx of L nephrectomy- done for recurrent UTI and renal mass in 2011, mass turned out not to be malignant 3. Morbid obesity, s/p lapband procedure on 12/9 4. HTN- hold and avoid lisinopril and other ACEI's 5. Hx CAD 6. Hypothyroidism   Vinson Moselle  MD [facility] [phone] pgr    [phone] cell 06/02/2012, 4:56 PM

## 2012-06-02 NOTE — H&P (Signed)
[name] is an 66 y.o. female.   Chief Complaint: came to hospital because my doctor told me too HPI: 66 year old Caucasian female came in for followup yesterday to my office after undergoing laparoscopic adjustable gastric band placement and laparoscopic lysis of adhesion surgery on December 9. She was discharged the following day. She states that she has felt fatigued and tired since surgery. She was also very concerned about her lack of weight loss. She thought her weight loss would be much higher at this point. She stated that all she has been drinking has been protein shakes about 5 per day as well as water in between to protein shakes. She denied any fever, chills, nausea, vomiting, regurgitation, diarrhea or constipation. She denied any melena or hematochezia. She denied any chest pain or chest pressure. She still has her chronic back pain which is limiting her activity. She is scheduled to see the nutritionist on Monday. She denied any lightheadedness or dizziness.  Based on her mild hypotension in the office, I ordered a basic metabolic panel and CBC. I also asked her to stop taking her blood pressure medicine. Her labs came back this morning. Her CBC was normal. However her BUN and creatinine were elevated. Her creatinine was around 3.9. Based on her lab findings we arranged for her to be direct admitted to the hospital for aggressive IV fluid resuscitation and workup of her acute kidney injury.  She states that on the way to the hospital she had her husband stopped at [facility] and she got a chicken sandwich. She states that she took about 3 bites although once and she immediately knew that she had eaten too much. She eventually regurgitated. She denies any other changes since yesterday when she was seen in the clinic   Past Medical History  Diagnosis Date  . Palpitations     history  . Depression   . Hyperlipidemia   . Hypertension   . Hypothyroidism   . Obesity (BMI 30-39.9)    . CAD (coronary artery disease)   . Chronic kidney disease     normal kidney function; left kidney removed 2012  . Arthritis   . Fibromyalgia   . PONV (postoperative nausea and vomiting)     nausea only    Past Surgical History  Procedure Date  . Appendectomy   . Cholecystectomy   . Tonsillectomy   . Total knee arthroplasty 2003 (R); 2010 (L); 2012 (L clean-out)    Bilateral  . Laparoscopic incisional / umbilical / ventral hernia repair 2004    upper midline hernia, 15 x 19cm dual Goretex mesh  . Left nephrectomy 2012    noncancerous mass, [place]  . Total abdominal hysterectomy 1978  . Laparoscopic gastric banding 05/22/2012    Procedure: LAPAROSCOPIC GASTRIC BANDING;  Surgeon: Atilano Ina, MD,FACS;  Location: [facility];  Service: General;  Laterality: N/A;  with mesh to bandport  . Laparoscopic lysis of adhesions 05/22/2012    Procedure: LAPAROSCOPIC LYSIS OF ADHESIONS;  Surgeon: Atilano Ina, MD,FACS;  Location: [facility];  Service: General;  Laterality: N/A;  For 30 minutes  . Mesh applied to lap port 05/22/2012    Procedure: MESH APPLIED TO LAP PORT;  Surgeon: Atilano Ina, MD,FACS;  Location: [facility];  Service: General;;    Family History  Problem Relation Age of Onset  . Adopted: Yes   Social History:  reports that she has never smoked. She has never used smokeless tobacco. She reports that she does  not drink alcohol or use illicit drugs.  Allergies: No Known Allergies  Medications Prior to Admission  Medication Sig Dispense Refill  . aspirin 81 MG EC tablet Take 81 mg by mouth every morning.       Marland Kitchen buPROPion (WELLBUTRIN XL) 150 MG 24 hr tablet Take 150 mg by mouth daily before breakfast.      . Calcium Carbonate-Vitamin D (CALTRATE 600+D) 600-400 MG-UNIT per tablet Take 1 tablet by mouth every morning.       . Cholecalciferol (VITAMIN D3) 1000 UNITS CAPS Take 1 capsule by mouth daily after lunch.       . clonazePAM (KLONOPIN) 2 MG tablet Take 2 mg by mouth at bedtime.       Marland Kitchen doxepin (SINEQUAN) 25 MG capsule Take 50 mg by mouth at bedtime.       . DULoxetine (CYMBALTA) 30 MG capsule Take 30 mg by mouth 3 (three) times daily.       Marland Kitchen levothyroxine (SYNTHROID, LEVOTHROID) 75 MCG tablet Take 75 mcg by mouth daily before breakfast.       . lisinopril (PRINIVIL,ZESTRIL) 20 MG tablet Take 20 mg by mouth daily before breakfast.       . NON FORMULARY Take 1 capsule by mouth daily at 3 pm. MEGA RED CAP      . pregabalin (LYRICA) 75 MG capsule Take 75 mg by mouth 2 (two) times daily.      Marland Kitchen pyridOXINE (VITAMIN B-6) 100 MG tablet Take 100 mg by mouth every morning.       . rosuvastatin (CRESTOR) 20 MG tablet Take 20 mg by mouth at bedtime.       . vitamin B-12 (CYANOCOBALAMIN) 1000 MCG tablet Take 1,000 mcg by mouth every evening.       Marland Kitchen oxyCODONE-acetaminophen (ROXICET) 5-325 MG/5ML solution Take 5-10 mLs by mouth every 4 (four) hours as needed. Pain.      . [DISCONTINUED] oxyCODONE-acetaminophen (ROXICET) 5-325 MG/5ML solution Take 5-10 mLs by mouth every 4 (four) hours as needed.  250 mL  0    Results for orders placed during the hospital encounter of 06/02/12 (from the past 48 hour(s))  COMPREHENSIVE METABOLIC PANEL     Status: Abnormal   Collection Time   06/02/12  1:05 PM      Component Value Range Comment   Sodium 141  135 - 145 mEq/L    Potassium 4.0  3.5 - 5.1 mEq/L    Chloride 104  96 - 112 mEq/L    CO2 27  19 - 32 mEq/L    Glucose, Bld 130 (*) 70 - 99 mg/dL    BUN 85 (*) 6 - 23 mg/dL    Creatinine, Ser 1.61 (*) 0.50 - 1.10 mg/dL    Calcium 9.6  8.4 - 09.6 mg/dL    Total Protein 6.9  6.0 - 8.3 g/dL    Albumin 3.7  3.5 - 5.2 g/dL    AST 25  0 - 37 U/L    ALT 23  0 - 35 U/L    Alkaline Phosphatase 86  39 - 117 U/L    Total Bilirubin 0.2 (*) 0.3 - 1.2 mg/dL    GFR calc non Af Amer 14 (*) >90 mL/min    GFR calc Af Amer 16 (*) >90 mL/min   MAGNESIUM     Status: Abnormal   Collection Time   06/02/12  1:05 PM      Component Value Range Comment    Magnesium 2.6 (*)  1.5 - 2.5 mg/dL   PHOSPHORUS     Status: Normal   Collection Time   06/02/12  1:05 PM      Component Value Range Comment   Phosphorus 2.8  2.3 - 4.6 mg/dL    No results found.  Review of Systems  Constitutional: Positive for malaise/fatigue. Negative for fever, chills and diaphoresis.  HENT: Negative for hearing loss.   Eyes: Negative for blurred vision and double vision.  Respiratory: Negative for sputum production and shortness of breath.   Cardiovascular: Negative for chest pain, palpitations, orthopnea and leg swelling.  Gastrointestinal: Positive for constipation. Negative for abdominal pain.  Genitourinary: Negative for dysuria and urgency.  Musculoskeletal: Negative for myalgias and back pain.  Neurological: Positive for weakness. Negative for tingling, focal weakness, seizures and loss of consciousness.  Endo/Heme/Allergies: Negative for polydipsia. Does not bruise/bleed easily.  Psychiatric/Behavioral: Negative for substance abuse.    Blood pressure 119/68, pulse 92, temperature 98.6 F (37 C), temperature source Oral, resp. rate 18, height 5\' 7"  (1.702 m), weight 249 lb (112.946 kg), SpO2 100.00%. Physical Exam  Vitals reviewed. Constitutional: She is oriented to person, place, and time. She appears well-developed. No distress.  HENT:  Head: Normocephalic and atraumatic.  Right Ear: External ear normal.  Eyes: Conjunctivae normal are normal. No scleral icterus.  Neck: Neck supple. No tracheal deviation present. No thyromegaly present.  Cardiovascular: Normal rate, regular rhythm and normal heart sounds.   Respiratory: Effort normal and breath sounds normal. No stridor. No respiratory distress. She has no wheezes.  GI: Soft. Bowel sounds are normal. She exhibits no distension. There is no tenderness. There is no rebound.       Healing trocar incisions - c/d/i; port site ok  Musculoskeletal: She exhibits no edema and no tenderness.  Neurological: She is  alert and oriented to person, place, and time.  Skin: Skin is warm and dry. She is not diaphoretic.  Psychiatric: She has a normal mood and affect. Her behavior is normal. Judgment and thought content normal.     Assessment/Plan Status post laparoscopic adjustable gastric band placement and laparoscopic lysis of adhesions December 9 Acute kidney injury History of hypertension Hypothyroidism Depression Fibromyalgia Hyperlipidemia  The patient was admitted earlier today. I placed her on large volume IV fluid resuscitation. Her creatinine had come down slightly today when her labs were repeated. We will hold her lisinopril. Renal was consulted. In the interim we will let her have a bariatric diet. She'll be maintained on subcutaneous heparin for DVT prophylaxis. We will obtain an upper GI just to make sure that her band is in good position.  [name]. Andrey Campanile, MD, FACS General, Bariatric, & Minimally Invasive Surgery [facility], [place]   [facility] M 06/02/2012, 6:56 PM

## 2012-06-03 ENCOUNTER — Observation Stay (HOSPITAL_COMMUNITY): Payer: Medicare Other

## 2012-06-03 ENCOUNTER — Other Ambulatory Visit (HOSPITAL_COMMUNITY): Payer: Medicare Other

## 2012-06-03 DIAGNOSIS — N179 Acute kidney failure, unspecified: Secondary | ICD-10-CM | POA: Diagnosis present

## 2012-06-03 LAB — BASIC METABOLIC PANEL
BUN: 60 mg/dL — ABNORMAL HIGH (ref 6–23)
Calcium: 8.9 mg/dL (ref 8.4–10.5)
Creatinine, Ser: 2.09 mg/dL — ABNORMAL HIGH (ref 0.50–1.10)
GFR calc Af Amer: 27 mL/min — ABNORMAL LOW (ref >90)
GFR calc non Af Amer: 24 mL/min — ABNORMAL LOW (ref >90)
Glucose, Bld: 106 mg/dL — ABNORMAL HIGH (ref 70–99)

## 2012-06-03 MED ORDER — PANTOPRAZOLE SODIUM 40 MG PO TBEC
40.0000 mg | DELAYED_RELEASE_TABLET | Freq: Every day | ORAL | Status: DC
  Administered 2012-06-03 – 2012-06-04 (×2): 40 mg via ORAL
  Filled 2012-06-03 (×2): qty 1

## 2012-06-03 MED ORDER — VITAMINS A & D EX OINT
TOPICAL_OINTMENT | CUTANEOUS | Status: AC
  Administered 2012-06-03: 10:00:00
  Filled 2012-06-03: qty 5

## 2012-06-03 NOTE — Progress Notes (Signed)
Subjective: Feeling better, good UOP, creat down to 2.0  Objective Vital signs in last 24 hours: Filed Vitals:   06/03/12 0233 06/03/12 0522 06/03/12 1005 06/03/12 1451  BP: 97/51 111/53 115/53 135/65  Pulse: 70 67 66 67  Temp: 98.2 F (36.8 C) 97.5 F (36.4 C) 97.8 F (36.6 C) 98.2 F (36.8 C)  TempSrc: Oral Oral Oral Oral  Resp: 16 16 16 16   Height:      Weight:      SpO2: 97% 100% 100% 99%   Weight change:   Intake/Output Summary (Last 24 hours) at 06/03/12 1815 Last data filed at 06/03/12 1556  Gross per 24 hour  Intake   5048 ml  Output   2200 ml  Net   2848 ml   Labs: Basic Metabolic Panel:  Lab 06/03/12 2130 06/02/12 1305 06/01/12 1100  NA 141 141 139  K 4.1 4.0 4.9  CL 110 104 96  CO2 26 27 26   GLUCOSE 106* 130* 111*  BUN 60* 85* 98*  CREATININE 2.09* 3.19* 3.93*  ALB -- -- --  CALCIUM 8.9 9.6 9.6  PHOS -- 2.8 --   Liver Function Tests:  Lab 06/02/12 1305  AST 25  ALT 23  ALKPHOS 86  BILITOT 0.2*  PROT 6.9  ALBUMIN 3.7   No results found for this basename: LIPASE:3,AMYLASE:3 in the last 168 hours No results found for this basename: AMMONIA:3 in the last 168 hours CBC:  Lab 06/01/12 1100  WBC 8.6  NEUTROABS 4.8  HGB 13.2  HCT 37.6  MCV 85.5  PLT 258   PT/INR: @labrcntip (inr:5) Cardiac Enzymes: No results found for this basename: CKTOTAL:5,CKMB:5,CKMBINDEX:5,TROPONINI:5 in the last 168 hours CBG: No results found for this basename: GLUCAP:5 in the last 168 hours  Iron Studies: No results found for this basename: IRON:30,TIBC:30,TRANSFERRIN:30,FERRITIN:30 in the last 168 hours  Physical Exam:  Blood pressure 135/65, pulse 67, temperature 98.2 F (36.8 C), temperature source Oral, resp. rate 16, height 5\' 7"  (1.702 m), weight 112.946 kg (249 lb), SpO2 99.00%.  Gen: obese alert adult female in NAD  Skin: no rash, cyanosis  HEENT: EOMI, sclera anicteric, throat clear and slightly moist  Neck: no JVD, no LAN  Chest: clear bilat to  bases  CV: regular, no rub or gallop, pedal pulses wnl  Abdomen: soft, obese, nontender, SQ port in R mid abdomen, no masses or HSM  Ext: no LE edema, no UE or facial edema, no joint effusion or deformity, no gangrene or ulceration  Neuro: alert, Ox3, no focal deficit, no asterixis   UA- neg protein, 0-2 wbc, no rbc's, many bact   Impression/Plan  1. 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 Creat down to 2.0.  Keep off ACEI, would use norvasc 5-10 once daily if needed for BP control. OK to go home in am if creat continues to improve as it should. Avoid NSAID's.  2. Hx of L nephrectomy- done for recurrent UTI and renal mass in 2011, mass turned out not to be malignant 3. Morbid obesity, s/p lapband procedure on 12/9 4. HTN- hold and avoid lisinopril and other ACEI's 5. Hx CAD 6. Hypothyroidism   Vinson Moselle  MD [facility] [phone] pgr    [phone] cell 06/03/2012, 6:15 PM

## 2012-06-03 NOTE — Progress Notes (Signed)
  Subjective: Doing well. No n/v. No abd pain. Ambulating. Feeling better. Slept well. 1400cc urine  Objective: Vital signs in last 24 hours: Temp:  [97.5 F (36.4 C)-98.6 F (37 C)] 97.5 F (36.4 C) (12/21 0522) Pulse Rate:  [67-92] 67  (12/21 0522) Resp:  [16-18] 16  (12/21 0522) BP: (97-123)/(51-74) 111/53 mmHg (12/21 0522) SpO2:  [97 %-100 %] 100 % (12/21 0522) Weight:  [249 lb (112.946 kg)] 249 lb (112.946 kg) (12/20 1239) Last BM Date: 06/02/12  Intake/Output from previous day: 12/20 0701 - 12/21 0700 In: 565 [P.O.:240; I.V.:325] Out: 1400 [Urine:1400] Intake/Output this shift:    Alert, nad cta  Reg Obese, soft, nt, nd. Incision c/d/i  Lab Results:   Basename 06/01/12 1100  WBC 8.6  HGB 13.2  HCT 37.6  PLT 258   BMET  Basename 06/03/12 0413 06/02/12 1305  NA 141 141  K 4.1 4.0  CL 110 104  CO2 26 27  GLUCOSE 106* 130*  BUN 60* 85*  CREATININE 2.09* 3.19*  CALCIUM 8.9 9.6   PT/INR No results found for this basename: LABPROT:2,INR:2 in the last 72 hours ABG No results found for this basename: PHART:2,PCO2:2,PO2:2,HCO3:2 in the last 72 hours  Studies/Results: US Renal  06/03/2012  *RADIOLOGY REPORT*  Clinical Data: Acute renal failure, prior left nephrectomy, chronic renal disease  RENAL/URINARY TRACT ULTRASOUND COMPLETE  Comparison:  None.  Findings:  Right Kidney:  There is minimal fullness of the right pelvis which does not extend into the calyceal systems.  Very mild hydronephrosis cannot be excluded.  Also, this appearance could be due to small parapelvic cysts.  The right kidney measures 13.6 cm sagittally.  Left Kidney:  The left kidney has previously been resected.  Bladder:  The urinary bladder is unremarkable.  Prevoid urine volume is 234 ml.  IMPRESSION:  1.  Minimal fullness of the right renal pelvis.  Possible mild hydronephrosis.  Difficult to exclude small parapelvic cysts by ultrasound. 2.  Prior left nephrectomy.   Original Report  Authenticated By: Dwyane Dee, M.D.     Anti-infectives: Anti-infectives    None      Assessment/Plan: s/p LAGB Acute kidney injury - likely multifactorial. Appreciate renal input/assist. Cr and BUN trending down. Will not restart ace-i. HTN - blood pressure good and so far not requiring coverage Cont VTE prophylaxis Ambulate Will hold off on UGI - since not emergency. Pt has no fever, no wbc, no abd pain, no tachycardia, and tolerating diet. Suspicion for lapband problem very low. Will resume bariatric diet  dispo - possible home Sunday as long as labs cont to normalize with short interval BMET mid-week. Will ask renal their thoughts regarding that plan.   [name]. Andrey Campanile, MD, FACS General, Bariatric, & Minimally Invasive Surgery [facility], [place]   LOS: 1 day    Atilano Ina 06/03/2012

## 2012-06-04 LAB — RENAL FUNCTION PANEL
CO2: 24 mEq/L (ref 19–32)
GFR calc Af Amer: 52 mL/min — ABNORMAL LOW (ref >90)
Glucose, Bld: 94 mg/dL (ref 70–99)
Potassium: 4.1 mEq/L (ref 3.5–5.1)
Sodium: 143 mEq/L (ref 135–145)

## 2012-06-04 MED ORDER — BUPROPION HCL 75 MG PO TABS: 75.0000 mg | ORAL_TABLET | Freq: Two times a day (BID) | ORAL | Status: AC

## 2012-06-04 MED ORDER — ACETAMINOPHEN 10 MG/ML IV SOLN
1000.0000 mg | Freq: Once | INTRAVENOUS | Status: AC
  Administered 2012-06-04: 1000 mg via INTRAVENOUS
  Filled 2012-06-04: qty 100

## 2012-06-04 NOTE — Discharge Summary (Addendum)
Physician Discharge Summary  [name]:[id number] DOB: 28-Jan-1946 DOA: 06/02/2012  PCP: Philemon Kingdom, MD  Admit date: 06/02/2012 Discharge date: 06/04/2012  Recommendations for Outpatient Follow-up:   Follow-up Information    Follow up with Atilano Ina, MD,FACS. On 06/22/2012. (8:45 AM)    Contact information:   [address] [place] [address] [phone]       Follow up with PROCHNAU,CAROLINE, MD. Schedule an appointment as soon as possible for a visit in 1 week. (take blood pressure readings with you)    Contact information:   [address] [place] [address] [phone]       Follow up with LAB work. On 06/08/2012. (our office will call you with instructions)         Discharge Diagnoses:  Patient Active Problem List  Diagnosis  . HYPOTHYROIDISM  . HYPERLIPIDEMIA-MIXED  . DEPRESSION  . HYPERTENSION, BENIGN ESSENTIAL  . CAD  . Obesity (BMI 30-39.9)  . H/O laparoscopic adjustable gastric banding 05/22/2012  . Acute kidney injury   Surgical Procedure: none  Consults: Nephrology - Dr Arlean Hopping  Discharge Condition: good Disposition: to home  Diet recommendation: postop LapBand diet - full liquids, small amount of solid proteins  Filed Weights   06/02/12 1239  Weight: 249 lb (112.946 kg)   History of present illness: 66 year old Caucasian female came in for followup yesterday to my office after undergoing laparoscopic adjustable gastric band placement and laparoscopic lysis of adhesion surgery on December 9. She was discharged the following day. She states that she has felt fatigued and tired since surgery. She was also very concerned about her lack of weight loss. She thought her weight loss would be much higher at this point. She stated that all she has been drinking has been protein shakes about 5 per day as well as water in between to protein shakes. She denied any fever, chills, nausea, vomiting, regurgitation, diarrhea or  constipation. She denied any melena or hematochezia. She denied any chest pain or chest pressure. She still has her chronic back pain which is limiting her activity. She is scheduled to see the nutritionist on Monday. She denied any lightheadedness or dizziness.  Based on her mild hypotension in the office, I ordered a basic metabolic panel and CBC. I also asked her to stop taking her blood pressure medicine. Her labs came back this morning. Her CBC was normal. However her BUN and creatinine were elevated. Her creatinine was around 3.9. Based on her lab findings we arranged for her to be direct admitted to the hospital for aggressive IV fluid resuscitation and workup of her acute kidney injury.  Hospital Course:  The patient was admitted to [facility]. She was started on large volume IV fluid resuscitation. Her blood pressure medication lisinopril was discontinued. She was allowed to have a postop bariatric diet. Nephrology was consulted. It was felt that her acute kidney injury is probably multifactorial, likely due to hypotension, continued use of her ACE inhibitor, as well as some dehydration. She remained afebrile throughout her hospitalization. She underwent a renal ultrasound. Her kidney function improved daily. On day of discharge her kidney function was almost back to normal. She was having excellent urine output. She was tolerating a bariatric diet. Her vital signs are stable.  The patient will be discharged on no blood pressure medication. She was instructed to record her blood pressure at least once a day and to contact the office should be consistently elevated. Her primary care  physician's office is closed this coming week. If she needs to be placed back on the blood pressure medication, nephrology recommended starting with Norvasc 5 mg by mouth  Every day.  BP 138/74  Pulse 65  Temp 98.5 F (36.9 C) (Oral)  Resp 16  Ht 5\' 7"  (1.702 m)  Wt 249 lb (112.946 kg)  BMI 39.00 kg/m2   SpO2 95%  Gen: alert, NAD, non-toxic appearing HEENT: normocephalic, atraumatic; pupils equal, no scleral icterus, neck supple, no lymphadenopathy Pulm: Lungs clear to auscultation, symmetric chest rise CV: regular rate and rhythm Abd: soft, nontender, nondistended. Well-healed trocar sites. No incisional hernia. Port is in right mid-abdomen Ext: no edema, normal, symmetric strength Neuro: nonfocal, sensation grossly intact Psych: appropriate, judgment normal   Discharge Instructions  Discharge Orders    Future Appointments: Provider: Department: Dept Phone: Center:   06/05/2012 4:00 PM [id number] [phone] NDM   06/22/2012 8:45 AM Atilano Ina, MD,FACS [facility], [place] [phone] None     Future Orders Please Complete By Expires   Increase activity slowly     Get blood work done on Thursday to monitor kidney function Record blood pressure daily - call office if systolic blood pressure (top number) is consistently >160 &/or diastolic blood pressure (bottom number) >90; take blood pressure readings with you to your PCP appointment     Medication List     As of 06/04/2012 10:26 AM    STOP taking these medications         buPROPion 150 MG 24 hr tablet   Commonly known as: WELLBUTRIN XL      lisinopril 20 MG tablet   Commonly known as: PRINIVIL,ZESTRIL      TAKE these medications         aspirin 81 MG EC tablet   Take 81 mg by mouth every morning.      buPROPion 75 MG tablet   Commonly known as: WELLBUTRIN   Take 1 tablet (75 mg total) by mouth 2 (two) times daily.      CALTRATE 600+D 600-400 MG-UNIT per tablet   Generic drug: Calcium Carbonate-Vitamin D   Take 1 tablet by mouth every morning.      clonazePAM 2 MG tablet   Commonly known as: KLONOPIN   Take 2 mg by mouth at bedtime.      doxepin 25 MG capsule   Commonly known as: SINEQUAN   Take 50 mg by mouth at bedtime.       DULoxetine 30 MG capsule   Commonly known as: CYMBALTA   Take 30 mg by mouth 3 (three) times daily.      levothyroxine 75 MCG tablet   Commonly known as: SYNTHROID, LEVOTHROID   Take 75 mcg by mouth daily before breakfast.      NON FORMULARY   Take 1 capsule by mouth daily at 3 pm. MEGA RED CAP      oxyCODONE-acetaminophen 5-325 MG/5ML solution   Commonly known as: ROXICET   Take 5-10 mLs by mouth every 4 (four) hours as needed. Pain.      pregabalin 75 MG capsule   Commonly known as: LYRICA   Take 75 mg by mouth 2 (two) times daily.      pyridOXINE 100 MG tablet   Commonly known as: VITAMIN B-6   Take 100 mg by mouth every morning.      rosuvastatin 20 MG tablet   Commonly known as: CRESTOR  Take 20 mg by mouth at bedtime.      vitamin B-12 1000 MCG tablet   Commonly known as: CYANOCOBALAMIN   Take 1,000 mcg by mouth every evening.      Vitamin D3 1000 UNITS Caps   Take 1 capsule by mouth daily after lunch.           Follow-up Information    Follow up with Atilano Ina, MD,FACS. On 06/22/2012. (8:45 AM)    Contact information:   [address] [place] [address] [phone]       Follow up with PROCHNAU,CAROLINE, MD. Schedule an appointment as soon as possible for a visit in 1 week. (take blood pressure readings with you)    Contact information:   [address] [place] [address] [phone]       Follow up with LAB work. On 06/08/2012. (our office will call you with instructions)           The results of significant diagnostics from this hospitalization (including imaging, microbiology, ancillary and laboratory) are listed below for reference.    Significant Diagnostic Studies: Dg Abd 1 View  05/23/2012  *RADIOLOGY REPORT*  Clinical Data: Status post lap banding  ABDOMEN - 1 VIEW  Comparison: 04/05/2012  Findings: Gastric band is identified within the left upper quadrant of the abdomen.  The band is oriented in the 2 o'clock to 8  o'clock position.  Hernia mesh overlies the central portion of the abdomen. The bowel gas pattern appears normal.  No dilated loops of small bowel or fluid levels.  IMPRESSION:  1.  Normal bowel gas pattern. 2.  Status post gastric banding.   Original Report Authenticated By: Signa Kell, M.D.    US Renal  06/03/2012  *RADIOLOGY REPORT*  Clinical Data: Acute renal failure, prior left nephrectomy, chronic renal disease  RENAL/URINARY TRACT ULTRASOUND COMPLETE  Comparison:  None.  Findings:  Right Kidney:  There is minimal fullness of the right pelvis which does not extend into the calyceal systems.  Very mild hydronephrosis cannot be excluded.  Also, this appearance could be due to small parapelvic cysts.  The right kidney measures 13.6 cm sagittally.  Left Kidney:  The left kidney has previously been resected.  Bladder:  The urinary bladder is unremarkable.  Prevoid urine volume is 234 ml.  IMPRESSION:  1.  Minimal fullness of the right renal pelvis.  Possible mild hydronephrosis.  Difficult to exclude small parapelvic cysts by ultrasound. 2.  Prior left nephrectomy.   Original Report Authenticated By: Dwyane Dee, M.D.     Labs: Basic Metabolic Panel:  Lab 06/04/12 4259 06/03/12 0413 06/02/12 1305 06/01/12 1100  NA 143 141 141 139  K 4.1 4.1 4.0 4.9  CL 113* 110 104 96  CO2 24 26 27 26   GLUCOSE 94 106* 130* 111*  BUN 29* 60* 85* 98*  CREATININE 1.24* 2.09* 3.19* 3.93*  CALCIUM 9.0 8.9 9.6 9.6  MG -- -- 2.6* --  PHOS 2.6 -- 2.8 --   Liver Function Tests:  Lab 06/04/12 0405 06/02/12 1305  AST -- 25  ALT -- 23  ALKPHOS -- 86  BILITOT -- 0.2*  PROT -- 6.9  ALBUMIN 3.0* 3.7   CBC:  Lab 06/01/12 1100  WBC 8.6  NEUTROABS 4.8  HGB 13.2  HCT 37.6  MCV 85.5  PLT 258   Principal Problem:  *Acute kidney injury Active Problems:  HYPOTHYROIDISM  HYPERTENSION, BENIGN ESSENTIAL  Obesity (BMI 30-39.9)  H/O  laparoscopic adjustable gastric banding 05/22/2012   Time coordinating  discharge: 15 minutes  Signed:  Atilano Ina, MD [facility], [place] [phone] 06/04/2012, 10:26 AM

## 2012-06-04 NOTE — Progress Notes (Signed)
Patient discharged home, all discharge medications and instructions reviewed and questions answered.  Patient states she wishes to ambulate downstairs when her husband arrives.

## 2012-06-05 ENCOUNTER — Other Ambulatory Visit (INDEPENDENT_AMBULATORY_CARE_PROVIDER_SITE_OTHER): Payer: Self-pay | Admitting: General Surgery

## 2012-06-05 ENCOUNTER — Telehealth (INDEPENDENT_AMBULATORY_CARE_PROVIDER_SITE_OTHER): Payer: Self-pay | Admitting: General Surgery

## 2012-06-05 ENCOUNTER — Encounter: Payer: Medicare Other | Attending: General Surgery | Admitting: *Deleted

## 2012-06-05 DIAGNOSIS — Z9884 Bariatric surgery status: Secondary | ICD-10-CM

## 2012-06-05 DIAGNOSIS — Z01818 Encounter for other preprocedural examination: Secondary | ICD-10-CM | POA: Insufficient documentation

## 2012-06-05 DIAGNOSIS — Z713 Dietary counseling and surveillance: Secondary | ICD-10-CM | POA: Insufficient documentation

## 2012-06-05 NOTE — Telephone Encounter (Signed)
Called pt to find out the status of her BP. 138/80 and pulse 57.  Informed her that if her BP drops low again then she needs to call her PCP.  She said she understood.

## 2012-06-05 NOTE — Telephone Encounter (Signed)
Message copied by Littie Deeds on Mon Jun 05, 2012 10:17 AM ------      Message from: [place], [place]      Created: Thu Jun 01, 2012 11:01 AM       Please call this Andrey Campanile patient on Monday to get her blood pressure readings (80's/50's - Thursday) if still low she needs to see her PCP. Thanks!            Lesly Rubenstein

## 2012-06-05 NOTE — Telephone Encounter (Signed)
Spoke with pt and informed her that she needs to call us if her BP drops too low or stays consecutively high for a couple days so that we can re-adjust our plan for her.  I also explained that Dr. Andrey Campanile wants her to have her BMET run again on Thursday 12/26.  Informed her that I have faxed the orders over there so she just needs to go Thursday and they will draw those labs for her.  She said she understood and that she would have this done.

## 2012-06-06 LAB — BASIC METABOLIC PANEL
CO2: 28 mEq/L (ref 19–32)
Calcium: 10.1 mg/dL (ref 8.4–10.5)
Chloride: 109 mEq/L (ref 96–112)
Sodium: 145 mEq/L (ref 135–145)

## 2012-06-07 ENCOUNTER — Encounter: Payer: Self-pay | Admitting: *Deleted

## 2012-06-09 ENCOUNTER — Telehealth (INDEPENDENT_AMBULATORY_CARE_PROVIDER_SITE_OTHER): Payer: Self-pay | Admitting: General Surgery

## 2012-06-09 NOTE — Telephone Encounter (Signed)
Called patient to see if she had BMET on Thursday 06/08/2012. Patient states she got confused and went on 06/05/2012. Labs looked like they were improving. She states she feels better. She is up washing clothes and doing things around the house today. Her blood pressure has been running around 130's/70's but this morning was 157/82. She can not get an appt with her PCP Dr Sudie Bailey until 07/14/12. I made her aware I would see if she needs labs repeated and call her on Monday.

## 2012-06-12 ENCOUNTER — Encounter: Payer: Medicare Other | Admitting: *Deleted

## 2012-06-12 ENCOUNTER — Other Ambulatory Visit (INDEPENDENT_AMBULATORY_CARE_PROVIDER_SITE_OTHER): Payer: Self-pay | Admitting: General Surgery

## 2012-06-12 ENCOUNTER — Telehealth (INDEPENDENT_AMBULATORY_CARE_PROVIDER_SITE_OTHER): Payer: Self-pay | Admitting: General Surgery

## 2012-06-12 VITALS — Ht 67.0 in | Wt 244.4 lb

## 2012-06-12 DIAGNOSIS — E669 Obesity, unspecified: Secondary | ICD-10-CM

## 2012-06-12 DIAGNOSIS — N179 Acute kidney failure, unspecified: Secondary | ICD-10-CM

## 2012-06-12 DIAGNOSIS — Z9884 Bariatric surgery status: Secondary | ICD-10-CM

## 2012-06-12 LAB — BASIC METABOLIC PANEL
BUN: 27 mg/dL — ABNORMAL HIGH (ref 6–23)
CO2: 29 mEq/L (ref 19–32)
Chloride: 109 mEq/L (ref 96–112)
Creat: 1.2 mg/dL — ABNORMAL HIGH (ref 0.50–1.10)
Potassium: 4.6 mEq/L (ref 3.5–5.3)

## 2012-06-12 MED ORDER — AMLODIPINE BESYLATE 5 MG PO TABS: 5.0000 mg | ORAL_TABLET | Freq: Every day | ORAL | Status: AC

## 2012-06-12 NOTE — Telephone Encounter (Signed)
Spoke with patient's husband. Patient is still resting- she did not sleep well last night. He was made aware a new blood pressure medication sent to her pharmacy and they will have labs repeated this week. Will call with any problems.

## 2012-06-12 NOTE — Telephone Encounter (Signed)
Message copied by Liliana Cline on Mon Jun 12, 2012  8:53 AM ------      Message from: Andrey Campanile, ERIC M      Created: Mon Jun 12, 2012  7:55 AM       Cr better but will need repeat some point this week. pls forward/fax results to PCP

## 2012-06-12 NOTE — Telephone Encounter (Signed)
Labs forwarded to Dr Sudie Bailey. Fax [phone]. LMOM for patient to call back. To make her aware Dr Andrey Campanile wants her labs repeated this week and she should start Norvasc blood pressure medication. He has sent RX to her pharmacy. Awaiting call back.

## 2012-06-12 NOTE — Progress Notes (Signed)
Pt's blood pressure has returned to her baseline of "HTN"  - per renal's recommendation will start pt on norvasc 5mg /day

## 2012-06-13 ENCOUNTER — Encounter: Payer: Self-pay | Admitting: *Deleted

## 2012-06-15 ENCOUNTER — Telehealth (INDEPENDENT_AMBULATORY_CARE_PROVIDER_SITE_OTHER): Payer: Self-pay | Admitting: General Surgery

## 2012-06-15 NOTE — Telephone Encounter (Signed)
Message copied by Liliana Cline on Thu Jun 15, 2012  9:12 AM ------      Message from: Andrey Campanile, ERIC M      Created: Thu Jun 15, 2012  8:59 AM       pls forward to her PCP. Still recommend she see her PCP soon for f/u regarding kidney and BP. We also started her on new BP med - norvasc

## 2012-06-15 NOTE — Telephone Encounter (Signed)
Labs faxed to Dr Sudie Bailey. Tried to call patient with no answer. To see how she is doing and make her aware labs much better and to keep follow up with Dr Sudie Bailey.

## 2012-06-16 NOTE — Telephone Encounter (Signed)
Spoke with patient and made her aware of below.

## 2012-06-22 ENCOUNTER — Encounter (INDEPENDENT_AMBULATORY_CARE_PROVIDER_SITE_OTHER): Payer: Self-pay | Admitting: General Surgery

## 2012-06-22 ENCOUNTER — Ambulatory Visit (INDEPENDENT_AMBULATORY_CARE_PROVIDER_SITE_OTHER): Payer: Medicare Other | Admitting: General Surgery

## 2012-06-22 VITALS — BP 130/82 | HR 82 | Resp 16 | Ht 67.0 in | Wt 240.0 lb

## 2012-06-22 DIAGNOSIS — Z09 Encounter for follow-up examination after completed treatment for conditions other than malignant neoplasm: Secondary | ICD-10-CM

## 2012-06-22 NOTE — Patient Instructions (Signed)

## 2012-06-22 NOTE — Progress Notes (Signed)
Subjective:     Patient ID: [name], female   DOB: 05/09/1946, 67 y.o.   MRN: [id number]  HPI 67 year old Caucasian female comes in for a one-month checkup after undergoing laparoscopic adjustable gastric band surgery on December 9. I saw her in the office on December 19. At that time she was fatigued, had some mild hypotension-we checked a set of electrolytes which revealed she was in acute renal failure. She was hospitalized for several days. It was felt that her acute renal failure was multifactorial. Her kidney function normalized quite quickly. She states that she's been doing a lot better since discharge. She states that her energy levels back to normal. She denies any abdominal pain, regurgitation, nausea, vomiting, diarrhea or constipation. She does not have any restriction yet. She is walking twice a week with her husband for about 2 miles at a time. She has been documenting her blood pressures as instructed at the time of discharge. When her blood pressures reached up to the 150s over 70s, she started taking the Norvasc that I prescribed for her. The nephrologist during her hospitalization recommended discontinuation of her ACE inhibitor and starting her on Norvasc for hypertension.  Review of Systems     Objective:   Physical Exam BP 130/82  Pulse 82  Resp 16  Ht 5\' 7"  (1.702 m)  Wt 240 lb (108.863 kg)  BMI 37.59 kg/m2  See LapBand flowsheet  Gen: alert, NAD, non-toxic appearing HEENT: normocephalic, atraumatic; pupils equal, no scleral icterus, neck supple, no lymphadenopathy Pulm: Lungs clear to auscultation, symmetric chest rise CV: regular rate and rhythm Abd: soft, nontender, nondistended. Well-healed trocar sites. No incisional hernia. Port is in right mid-abdomen Ext: no edema, normal, symmetric strength Neuro: nonfocal, sensation grossly intact Psych: appropriate, judgment normal     Assessment:     S/p LAGB HTN - improved HPL - stable    Plan:     I  congratulated her on her weight loss. Her weight loss to date has been her approximately 12 pounds. I told her she could continue to take the Norvasc. It appears her pressure is tolerating the 5 mg Norvasc on a daily basis. I encouraged her to keep track of her blood pressure one daily basis. She will still need to followup with her primary care physician for management of this. I encouraged her to continue with routine exercise. I stressed the importance of it in relation to her weight loss. She has been compliant with taking her supplements. I believe she is now starting to understand her eating behavior with a lap band.  After obtaining verbal consent, the abdominal wall was prepped with Chloraprep. The port was accessed with a Huber needle and 1 cc of saline was added to give the patient an expected fill volume of 4 cc.  The patient was able to tolerate sips of water.  She was instructed to stay on liquids for the next 24-48 hours. Followup with me in 4 weeks  [name]. Andrey Campanile, MD, FACS General, Bariatric, & Minimally Invasive Surgery [facility], [place]

## 2012-06-23 ENCOUNTER — Telehealth (INDEPENDENT_AMBULATORY_CARE_PROVIDER_SITE_OTHER): Payer: Self-pay | Admitting: General Surgery

## 2012-06-23 NOTE — Telephone Encounter (Signed)
Spoke with patient and she states since her fill she feels more hungry. I explained to her if she is on liquids for 24 hours she will feel hungrier now. She states she has been eating and having to stop her self from eating more than she has been. I explained that we would not recommend another fill for at least two weeks and to give it time. She will call back if needed.

## 2012-06-23 NOTE — Telephone Encounter (Signed)
Pt called for Jade; was just seen for lap band fill (Dr. Andrey Campanile) and has questions.  Wants to speak to Elizabeth only.

## 2012-07-04 ENCOUNTER — Ambulatory Visit (INDEPENDENT_AMBULATORY_CARE_PROVIDER_SITE_OTHER): Payer: Medicare Other | Admitting: General Surgery

## 2012-07-04 ENCOUNTER — Encounter (INDEPENDENT_AMBULATORY_CARE_PROVIDER_SITE_OTHER): Payer: Self-pay | Admitting: General Surgery

## 2012-07-04 VITALS — BP 140/80 | HR 84 | Temp 97.8°F | Resp 18 | Ht 67.0 in | Wt 241.6 lb

## 2012-07-04 DIAGNOSIS — Z09 Encounter for follow-up examination after completed treatment for conditions other than malignant neoplasm: Secondary | ICD-10-CM

## 2012-07-04 NOTE — Patient Instructions (Signed)
1. Stay on liquids for the next 1-2 days as you adapt to your new fill volume.  Then resume your previous diet. 2. Decreasing your carbohydrate intake will hasten you weight loss.  Rely more on proteins for your meals.  Avoid condiments that contain sweets such as Honey Mustard and sugary salad dressings.   3. Stay in the "green zone".  If you are regurgitating with meals, having night time reflux, and find yourself eating soft comfort foods (mashed potatoes, potato chips)...realize that you are developing "maladaptive eating".  You will not lose weight this way and may regain weight.  The GREEN ZONE is eating smaller portions and not regurgitating.  Hence we may need to withdraw fluid from your band. 4. Build exercise into your daily routine.  Walking is the best way to start but do something every day if you can.  Look into water aerobics and/or doing intervals on your treadmill

## 2012-07-04 NOTE — Progress Notes (Signed)
Subjective:     Patient ID: [name], female   DOB: November 30, 1945, 67 y.o.   MRN: [id number]  HPI 67 year old Caucasian female comes in for followup after undergoing laparoscopic adjustable gastric band surgery on December 9. I last saw her on January 9. She states that she thinks that she is making good food choices. However she is still limited with her exercise because of right lower back pain.she is taking her multivitamin and calcium supplement. She denies any regurgitation. She denies any heartburn or indigestion. She states that she cannot tell any restriction after her initial adjustment at the last visit. She states that her husband bought her treadmill and she is going to start walking on it. However she can't walk too much before she gets back pain.  Review of Systems     Objective:   Physical Exam BP 140/80  Pulse 84  Temp 97.8 F (36.6 C)  Resp 18  Ht 5\' 7"  (1.702 m)  Wt 241 lb 9.6 oz (109.589 kg)  BMI 37.84 kg/m2  See LapBand flowsheet  Gen: alert, NAD, non-toxic appearing HEENT: normocephalic, atraumatic; pupils equal, no scleral icterus, neck supple, no lymphadenopathy Pulm: Lungs clear to auscultation, symmetric chest rise CV: regular rate and rhythm Abd: soft, nontender, nondistended. Well-healed trocar sites. No incisional hernia. Port is in right mid-abdomen Ext: no edema, normal Neuro: nonfocal, sensation grossly intact Psych: appropriate, judgment normal     Assessment:     S/p LAGB 12/9 DDD - stable CAD Dislipidemia HTN - stable     Plan:     Her weight today is 241.6 pounds her weight at her last visit on January 9 was 240 pounds. She has gained 1.6 pounds since her last visit. Her total weight loss since surgery has been 10.6 pounds.  I understand that she is limited by back pain and joint pain however I stressed the importance of getting some form of regular exercise. I explained that it is critical to her success with the laparoscopic  adjustable gastric band. We discussed water aerobics. We also discussed incorporating interval training into her walking since she is limited by the amount of time that she can walk before she develops pain.  Since she has not noticed any restriction I did recommend an additional adjustment to her LapBand today  After obtaining verbal consent, the abdominal wall was prepped with Chloraprep. The port was accessed with a Huber needle and 1 cc of saline was added to give the patient an expected fill volume of 4 cc.  The patient was able to tolerate sips of water.  She was instructed to stay on a liquid diet for the next 24 hours. Followup with me in 4-6 weeks  [name]. Andrey Campanile, MD, FACS General, Bariatric, & Minimally Invasive Surgery [facility], [place]

## 2012-07-06 ENCOUNTER — Encounter (INDEPENDENT_AMBULATORY_CARE_PROVIDER_SITE_OTHER): Payer: Medicare Other | Admitting: General Surgery

## 2012-07-07 ENCOUNTER — Ambulatory Visit (INDEPENDENT_AMBULATORY_CARE_PROVIDER_SITE_OTHER): Payer: Medicare Other | Admitting: General Surgery

## 2012-07-07 ENCOUNTER — Encounter (INDEPENDENT_AMBULATORY_CARE_PROVIDER_SITE_OTHER): Payer: Self-pay | Admitting: General Surgery

## 2012-07-07 ENCOUNTER — Telehealth (INDEPENDENT_AMBULATORY_CARE_PROVIDER_SITE_OTHER): Payer: Self-pay | Admitting: General Surgery

## 2012-07-07 VITALS — BP 120/74 | HR 82 | Temp 97.6°F | Resp 18 | Ht 67.0 in | Wt 239.2 lb

## 2012-07-07 DIAGNOSIS — Z09 Encounter for follow-up examination after completed treatment for conditions other than malignant neoplasm: Secondary | ICD-10-CM

## 2012-07-07 NOTE — Progress Notes (Signed)
Subjective:     Patient ID: [name], female   DOB: 1945/07/30, 67 y.o.   MRN: [id number]  HPI 67 year old female status post laparoscopic adjustable gastric band placement in December 2013 who was just in the office on January 21 for lap band fill comes in because of intolerance to solids since her adjustment. She is able to drink liquids without a problem. However she has not been able to tolerate any solids since her last appointment a few days ago. She states that she is taking one bite at a time which is very small and chewing very thoroughly however it just sits there in her upper chest and is causing some burning as well as discomfort  Review of Systems     Objective:   Physical Exam BP 120/74  Pulse 82  Temp 97.6 F (36.4 C) (Temporal)  Resp 18  Ht 5\' 7"  (1.702 m)  Wt 239 lb 3.2 oz (108.5 kg)  BMI 37.46 kg/m2 Alert, nad Soft, nd, nt. Well healed incisions. Port in mid right abdomen    Assessment:     S/p LAGB Lap Band too tight    Plan:     Weight loss since last appointment a few days ago has been 2.4 pounds. However we need to remove some of the fluid from her bands that she is not tolerating solids. I explained the importance of her being able to tolerate solids. I explained that for lap band is too tight for a prolonged period of time it could lead to pouch dilatation.  After obtaining verbal consent, the abdominal wall was prepped with Chloraprep. The port was accessed with a Huber needle and 0.5 cc of saline was removed to give the patient an expected fill volume of 3.5 cc.  The patient was able to tolerate sips of water.  She was instructed to stay on liquids for next 24 hrs. We rediscussed proper eating techniques. F/u as scheduled or call if still having problems with solids  [name]. Andrey Campanile, MD, FACS General, Bariatric, & Minimally Invasive Surgery [facility], [place]

## 2012-07-07 NOTE — Telephone Encounter (Signed)
Called and spoke with patient. She is able to get down liquids and protein shakes but is not able to eat any solid foods. I tried to explain that this is not healthy and not the way to lose weight. I convinced her to reschedule her appt for today and she will be seeing Dr Andrey Campanile.

## 2012-07-07 NOTE — Patient Instructions (Signed)
Stay on liquids next 24 hours

## 2012-07-21 ENCOUNTER — Encounter (INDEPENDENT_AMBULATORY_CARE_PROVIDER_SITE_OTHER): Payer: Medicare Other | Admitting: General Surgery

## 2012-07-25 ENCOUNTER — Ambulatory Visit: Payer: Medicare Other | Admitting: *Deleted

## 2012-08-03 ENCOUNTER — Encounter: Payer: Self-pay | Admitting: *Deleted

## 2012-08-03 ENCOUNTER — Encounter: Payer: Medicare Other | Attending: General Surgery | Admitting: *Deleted

## 2012-08-03 VITALS — Ht 67.0 in | Wt 234.5 lb

## 2012-08-03 DIAGNOSIS — E669 Obesity, unspecified: Secondary | ICD-10-CM

## 2012-08-03 DIAGNOSIS — Z713 Dietary counseling and surveillance: Secondary | ICD-10-CM | POA: Insufficient documentation

## 2012-08-03 DIAGNOSIS — Z01818 Encounter for other preprocedural examination: Secondary | ICD-10-CM | POA: Insufficient documentation

## 2012-08-03 NOTE — Patient Instructions (Addendum)
Goals:  Follow Phase 3B: High Protein + Non-Starchy Vegetables  Avoid meal skipping  Increase lean protein foods to meet 60-80g goal  Increase fluid intake to 64oz + = will help with constipation  Avoid drinking 15 minutes before, during and 30 minutes after eating  Aim for >30 min of physical activity daily  Try Citracal Petites or equivalent brand - make sure to get calcium citrate  Sample Vitamin Schedule B:  MVI L: calcium (400-500 mg) D: calcium (400-500 mg) Bed: calcium (400-500 mg)

## 2012-08-03 NOTE — Progress Notes (Signed)
  Follow-up visit:  8 Weeks Post-Operative LAGB Surgery  Medical Nutrition Therapy:  Appt start time: 1230   End time:  1325.  Primary concerns today: Post-operative Bariatric Surgery Nutrition Management.  Surgery date: 05/22/12 Surgery type: LAGB Start weight at [facility]: 251 lbs (03/23/12)  Weight today: 234.5 lbs Weight change: 16.5 lbs Total weight lost: 16.5 lbs  Weight goal: 170 lbs % goal met: 20%  TANITA  BODY COMP RESULTS  08/03/12   BMI (kg/m^2) 36.7   Fat Mass (lbs) 115.5   Fat Free Mass (lbs) 119.0   Total Body Water (lbs) 87.0   24-hr recall: B (AM): 1.5 cups Oatmeal (40g CHO) - discussed decreasing CHO intake Snk (AM): Premier protein shake (30g pro)  L (PM): Protein shake (30g) OR tuna salad (25g) Snk (PM): Unknown  D (PM): Veggie/Turkey burger (no bun), green beans (20-25g) Snk (PM): Unknown  Fluid intake:  17 oz x4, protein shake - 65-80 oz Estimated total protein intake: 60-80g  Medications: MD changed BP med to Norvasc Supplementation: Taking MVI regularly; taking 1500 mg of calcium at SAME TIME   Using straws: No Drinking while eating:  No Hair loss: No Carbonated beverages: No N/V/D/C: Constipation reported; may be in part to taking all of calcium in 1 dose.  Last Lap-Band fill:  07/04/12; However, on 07/08/11 had to go back to have some removed; Reports she is now in the yellow zone  Recent physical activity:  None d/t recently broken wrist  Progress Towards Goal(s):  In progress.  Handouts given during visit include:  Phase 3B: High Protein + Non-Starchy Vegetables   Nutritional Diagnosis:  Nikolai-3.3 Overweight/obesity related to past poor dietary habits and physical inactivity as evidenced by patient w/ recent LAGB surgery attempting to follow dietary guidelines for continued weight loss.    Intervention:  Nutrition education/diet advancement.  Monitoring/Evaluation:  Dietary intake, exercise, lap band fills, and body weight. Follow up in 1  months for 3 month post-op visit.

## 2012-08-04 ENCOUNTER — Encounter: Payer: Self-pay | Admitting: Cardiology

## 2012-08-09 ENCOUNTER — Ambulatory Visit: Payer: Medicare Other | Admitting: Physician Assistant

## 2012-08-09 ENCOUNTER — Telehealth: Payer: Self-pay | Admitting: *Deleted

## 2012-08-09 NOTE — Telephone Encounter (Signed)
Received a fax from dr Sudie Bailey, the pt has been having chest pain and he wanted her to be seen sooner than 09-11-12. Spoke with pt, she had an appt today with the PA but canceled because she has not had any more pain. She does not want to be seen sooner than scheduled. She voiced understanding to call if she develops problems.

## 2012-08-16 ENCOUNTER — Ambulatory Visit (INDEPENDENT_AMBULATORY_CARE_PROVIDER_SITE_OTHER): Payer: Medicare Other | Admitting: General Surgery

## 2012-08-16 ENCOUNTER — Encounter (INDEPENDENT_AMBULATORY_CARE_PROVIDER_SITE_OTHER): Payer: Self-pay | Admitting: General Surgery

## 2012-08-16 VITALS — BP 118/70 | HR 84 | Resp 16 | Ht 67.0 in | Wt 229.0 lb

## 2012-08-16 DIAGNOSIS — Z9884 Bariatric surgery status: Secondary | ICD-10-CM

## 2012-08-16 NOTE — Progress Notes (Signed)
Subjective:     Patient ID: [name], female   DOB: 1945-09-01, 67 y.o.   MRN: [id number]  HPI 67 year old Caucasian female comes in for followup after undergoing laparoscopic adjustable gastric band surgery on 05/22/2012. I last saw her in the office on January 24. At that time we took out a little bit of fluid from her band after a recent adjustment. Since that visit she states that she has done well. She denies any nausea or vomiting. She denies any reflux. She had one episode of regurgitation and she states that was probably due to taking too large of a bite. She denies any diarrhea or constipation. She denies any morning cough. She reports being compliant with her daily medication. She unfortunately did trip and fall in her house and had a right wrist fracture for which she required a cast. She states that after her fall that exacerbated her chronic lower back pain. As a result she has not been able to exercise as much.  Review of Systems     Objective:   Physical Exam BP 118/70  Pulse 84  Resp 16  Ht 5\' 7"  (1.702 m)  Wt 229 lb (103.874 kg)  BMI 35.86 kg/m2  See LapBand flowsheet  Gen: alert, NAD, non-toxic appearing HEENT: normocephalic, atraumatic; pupils equal, no scleral icterus, neck supple, no lymphadenopathy Pulm: Lungs clear to auscultation, symmetric chest rise CV: regular rate and rhythm Abd: soft, nontender, nondistended. Well-healed trocar sites. No incisional hernia. Port is in right mid-abdomen Ext: no edema, cast Rt forearm/wrist Neuro: nonfocal, sensation grossly intact Psych: appropriate, judgment normal     Assessment:     Status post laparoscopic adjustable gastric band placement Hypertension-improved Degenerative disc disease-stable Coronary artery disease Dyslipidemia     Plan:     Her total weight loss to date since surgery is approximately 23 pounds. Her weight loss over the past 6 weeks since her last visit has been 10.2 pounds. I  congratulated her on her weight loss. I encouraged her to keep up the good work. She is somewhat limited by her musculoskeletal pain and the most recent arm fracture with respect to exercise. We discussed trying a recumbent bicycle.  After obtaining verbal consent, the abdominal wall was prepped with Chloraprep. The port was accessed with a Huber needle and 0.2 cc of saline was added to give the patient an expected fill volume of 3.7 cc.  The patient was able to tolerate sips of water.  She was instructed to stay on a liquid diet for the next 24-48 hours.  Followup 6 weeks  [name]. Andrey Campanile, MD, FACS General, Bariatric, & Minimally Invasive Surgery [facility], [place]

## 2012-08-16 NOTE — Patient Instructions (Signed)
1. Stay on liquids for the next 1- 2 days as you adapt to your new fill volume.  Then resume your previous diet. 2. Decreasing your carbohydrate intake will hasten you weight loss.  Rely more on proteins for your meals.  Avoid condiments that contain sweets such as Honey Mustard and sugary salad dressings.   3. Stay in the "green zone".  If you are regurgitating with meals, having night time reflux, and find yourself eating soft comfort foods (mashed potatoes, potato chips)...realize that you are developing "maladaptive eating".  You will not lose weight this way and may regain weight.  The GREEN ZONE is eating smaller portions and not regurgitating.  Hence we may need to withdraw fluid from your band. 4. Build exercise into your daily routine.  Walking is the best way to start but do something every day if you can.  Look into a recumbent bicycle

## 2012-09-01 ENCOUNTER — Telehealth (INDEPENDENT_AMBULATORY_CARE_PROVIDER_SITE_OTHER): Payer: Self-pay | Admitting: General Surgery

## 2012-09-01 NOTE — Telephone Encounter (Signed)
LMOM for patient making her aware I moved her appt from 09/21/2012 to 09/18/2012 due to MD bumping schedule. She is to call back if the new date and time do not work for her.  

## 2012-09-04 ENCOUNTER — Ambulatory Visit: Payer: Medicare Other | Admitting: *Deleted

## 2012-09-11 ENCOUNTER — Ambulatory Visit: Payer: Medicare Other | Admitting: Cardiology

## 2012-09-18 ENCOUNTER — Ambulatory Visit (INDEPENDENT_AMBULATORY_CARE_PROVIDER_SITE_OTHER): Payer: Medicare Other | Admitting: General Surgery

## 2012-09-18 ENCOUNTER — Encounter (INDEPENDENT_AMBULATORY_CARE_PROVIDER_SITE_OTHER): Payer: Self-pay | Admitting: General Surgery

## 2012-09-18 VITALS — BP 138/90 | HR 70 | Temp 98.7°F | Resp 18 | Ht 67.0 in | Wt 222.0 lb

## 2012-09-18 DIAGNOSIS — Z4651 Encounter for fitting and adjustment of gastric lap band: Secondary | ICD-10-CM

## 2012-09-18 NOTE — Patient Instructions (Signed)
1. Stay on liquids for the next 1- 2 days as you adapt to your new fill volume.  Then resume your previous diet. 2. Decreasing your carbohydrate intake will hasten you weight loss.  Rely more on proteins for your meals.  Avoid condiments that contain sweets such as Honey Mustard and sugary salad dressings.   3. Stay in the "green zone".  If you are regurgitating with meals, having night time reflux, and find yourself eating soft comfort foods (mashed potatoes, potato chips)...realize that you are developing "maladaptive eating".  You will not lose weight this way and may regain weight.  The GREEN ZONE is eating smaller portions and not regurgitating.  Hence we may need to withdraw fluid from your band. 4. Build exercise into your daily routine.  Walking is the best way to start but do something every day if you can.    

## 2012-09-18 NOTE — Progress Notes (Signed)
Subjective:     Patient ID: [name], female   DOB: 05-16-1946, 67 y.o.   MRN: [id number]  HPI 67 year old Caucasian female comes in for another postoperative visit after undergoing laparoscopic adjustable gastric band surgery on December 9. I last saw her in the office on 08/16/2012. At that time she weighed 229 pounds. Her weight today is 222 pounds. She states that she's had increased hunger. However she has curved her portion size.She states that she has been drinking more water and protein shakes. She states that she is going to a gym 3 times a week and working with a Systems analyst. She works on the machines for about 45 minutes each time. She is also riding a recumbent bicycle. She denies any abdominal pain, nausea, regurgitation, reflux, or morning cough. She denies any diarrhea.  Review of Systems     Objective:   Physical Exam BP 138/90  Pulse 70  Temp(Src) 98.7 F (37.1 C)  Resp 18  Ht 5\' 7"  (1.702 m)  Wt 222 lb (100.699 kg)  BMI 34.76 kg/m2 Alert, and nonfocal No apparent distress, moves all extremities Abdomen-soft, nontender, nondistended. Well-healed trocar incisions. Port in the right mid abdomen.    Assessment:     Status post laparoscopic adjustable gastric band placement     Plan:     Total weight loss has been approximately 30 pounds. Weight loss since last office visit has been roughly 7 pounds. I congratulated her on her weight loss. I also encouraged her to continue with the exercising. I explained that her food choices and exercise are evident with respect to her weight loss since her last office visit.  Since she still doesn't have much restriction I recommended an adjustment today  After obtaining verbal consent, the abdominal wall was prepped with Chloraprep. The port was accessed with a Huber needle and 0.2 cc of saline was added to give the patient an expected fill volume of 3.9 cc.  The patient was able to tolerate sips of water.  She was  instructed to stay on liquids for the next 24-48 hours. Followup 6 weeks  [name]. Andrey Campanile, MD, FACS General, Bariatric, & Minimally Invasive Surgery [facility], [place]

## 2012-09-21 ENCOUNTER — Ambulatory Visit: Payer: Medicare Other | Admitting: *Deleted

## 2012-09-21 ENCOUNTER — Encounter (INDEPENDENT_AMBULATORY_CARE_PROVIDER_SITE_OTHER): Payer: Medicare Other | Admitting: General Surgery

## 2012-09-25 ENCOUNTER — Encounter (INDEPENDENT_AMBULATORY_CARE_PROVIDER_SITE_OTHER): Payer: Self-pay

## 2012-09-25 ENCOUNTER — Ambulatory Visit: Payer: Medicare Other | Admitting: *Deleted

## 2012-10-05 ENCOUNTER — Ambulatory Visit (INDEPENDENT_AMBULATORY_CARE_PROVIDER_SITE_OTHER): Payer: Medicare Other | Admitting: Physician Assistant

## 2012-10-05 ENCOUNTER — Encounter (INDEPENDENT_AMBULATORY_CARE_PROVIDER_SITE_OTHER): Payer: Self-pay

## 2012-10-05 VITALS — BP 106/78 | HR 86 | Temp 97.2°F | Resp 16 | Ht 67.0 in | Wt 222.0 lb

## 2012-10-05 DIAGNOSIS — Z4651 Encounter for fitting and adjustment of gastric lap band: Secondary | ICD-10-CM

## 2012-10-05 NOTE — Patient Instructions (Signed)
Return in one week. Focus on good food choices as well as physical activity. Return sooner if you have an increase in hunger, portion sizes or weight. Return also for difficulty swallowing, night cough, reflux.   

## 2012-10-05 NOTE — Progress Notes (Signed)
  HISTORY: [name] is a 67 y.o.female who received an AP-Standard lap-band in December 2013 by Dr. Andrey Campanile. She was last seen by Dr. Andrey Campanile on 7 April for a 0.2 mL fill. She said about two weeks ago she had a migraine headache with associated vomiting and since then she's had persistent solid food dysphagia. She's able to tolerate liquids. She wants some fluid removed but she's clearly concerned about weight gain.  VITAL SIGNS: Filed Vitals:   10/05/12 1000  BP: 106/78  Pulse: 86  Temp: 97.2 F (36.2 C)  Resp: 16    PHYSICAL EXAM: Physical exam reveals a very well-appearing 67 y.o.female in no apparent distress Neurologic: Awake, alert, oriented Psych: Bright affect, conversant Respiratory: Breathing even and unlabored. No stridor or wheezing Abdomen: Soft, nontender, nondistended to palpation. Incisions well-healed. No incisional hernias. Port easily palpated. Extremities: Atraumatic, good range of motion.  ASSESMENT: 67 y.o.  female  s/p AP-Standard lap-band.   PLAN: The patient's port was accessed with a 20G Huber needle without difficulty. Clear fluid was aspirated and 0.5 mL saline was removed from the port to give a total predicted volume of 3.4 mL. The patient was advised to concentrate on healthy food choices and to avoid slider foods high in fats and carbohydrates. We'll have her back in one week.

## 2012-10-09 ENCOUNTER — Ambulatory Visit: Payer: Medicare Other | Admitting: *Deleted

## 2012-10-12 ENCOUNTER — Encounter (INDEPENDENT_AMBULATORY_CARE_PROVIDER_SITE_OTHER): Payer: Medicare Other

## 2012-10-16 ENCOUNTER — Ambulatory Visit: Payer: Medicare Other | Admitting: Cardiology

## 2012-10-19 ENCOUNTER — Encounter (INDEPENDENT_AMBULATORY_CARE_PROVIDER_SITE_OTHER): Payer: Medicare Other

## 2012-10-20 ENCOUNTER — Ambulatory Visit: Payer: Medicare Other | Admitting: *Deleted

## 2012-10-25 ENCOUNTER — Encounter: Payer: Self-pay | Admitting: *Deleted

## 2012-10-25 ENCOUNTER — Encounter: Payer: Medicare Other | Attending: General Surgery | Admitting: *Deleted

## 2012-10-25 VITALS — Ht 67.0 in | Wt 221.0 lb

## 2012-10-25 DIAGNOSIS — Z713 Dietary counseling and surveillance: Secondary | ICD-10-CM | POA: Insufficient documentation

## 2012-10-25 DIAGNOSIS — E669 Obesity, unspecified: Secondary | ICD-10-CM

## 2012-10-25 DIAGNOSIS — Z01818 Encounter for other preprocedural examination: Secondary | ICD-10-CM | POA: Insufficient documentation

## 2012-10-25 NOTE — Progress Notes (Addendum)
  Follow-up visit: 12 Weeks Post-Operative LAGB Surgery  Medical Nutrition Therapy:  Appt start time: 1600   End time:  1630.  Primary concerns today: Post-operative Bariatric Surgery Nutrition Management.  Surgery date: 05/22/12 Surgery type: LAGB Start weight at [facility]: 251 lbs (03/23/12)  Weight today: 221.0 lbs Weight change: 13.5 lbs Total weight lost: 30.0 lbs  Weight goal: 170 lbs % goal met: 37%  TANITA  BODY COMP RESULTS  08/03/12 10/25/12   BMI (kg/m^2) 36.7 34.6   Fat Mass (lbs) 115.5 103.0   Fat Free Mass (lbs) 119.0 118.0   Total Body Water (lbs) 87.0 86.5   24-hr recall: B (AM): [place] yogurt  Snk (AM): Premier protein shake (30g pro)  L (PM): 1/4 cup cottage cheese and tomato (sometimes with leftover chicken) Snk (PM): NONE D (PM): Veggie/Turkey burger (no bun), steamed veggies (20-25g) Snk (PM): SF jello  Fluid intake:  64 oz water, 22 oz protein shakes - 85-90 oz Estimated total protein intake: 60-80g  Medications: No changes reported Supplementation: Taking MVI regularly; continues to take 1500 mg of calcium at SAME TIME - discussed spreading doses out.   Using straws: No Drinking while eating:  Sometimes by accident. Continues to work on it. Hair loss: No Carbonated beverages: No N/V/D/C: Constipation resolved with Miralax. Recent stomach virus noted. Cannot tolerate eggs.  Last Lap-Band fill:  10/05/12; 0.5 cc removed d/t having a stomach virus which caused regurgitation of even water. Reports she is in the yellow zone; ready for fluid to be put back.   Recent physical activity:  Recently joined a gym with husband; working with trainer 3-4 times/week. Strength training and recumbent bike. Still has pain in back and knees upon exertion.    Progress Towards Goal(s):  In progress.   Nutritional Diagnosis:  Canon-3.3 Overweight/obesity related to past poor dietary habits and physical inactivity as evidenced by patient w/ recent LAGB surgery attempting to follow  dietary guidelines for continued weight loss.    Intervention:  Nutrition education/reinforcement  Monitoring/Evaluation:  Dietary intake, exercise, lap band fills, and body weight. Follow up in 3 months for 6 month post-op visit or sooner if needed.

## 2012-10-25 NOTE — Patient Instructions (Addendum)
Goals:  Follow Phase 3B: High Protein + Non-Starchy Vegetables  Increase lean protein foods to meet 60-80g goal  Avoid drinking 15 minutes before, during and 30 minutes after eating  Aim for >30 min of physical activity daily  Sample Vitamin Schedule (At least 2 hours apart) B:  MVI L: calcium (400 mg) D: calcium (400 mg) Bed: calcium (500 mg)

## 2012-10-27 ENCOUNTER — Encounter (INDEPENDENT_AMBULATORY_CARE_PROVIDER_SITE_OTHER): Payer: Self-pay | Admitting: General Surgery

## 2012-10-27 ENCOUNTER — Ambulatory Visit (INDEPENDENT_AMBULATORY_CARE_PROVIDER_SITE_OTHER): Payer: Medicare Other | Admitting: General Surgery

## 2012-10-27 VITALS — BP 112/64 | HR 78 | Resp 18 | Ht 67.0 in | Wt 222.6 lb

## 2012-10-27 DIAGNOSIS — Z9884 Bariatric surgery status: Secondary | ICD-10-CM

## 2012-10-27 NOTE — Progress Notes (Signed)
Subjective:     Patient ID: [name], female   DOB: [birth year], 67 y.o.   MRN: [id number]  HPI 67 year old Caucasian female comes in for followup after undergoing laparoscopic adjustable gastric band placement in December 2013. I saw her last on April 7. However she saw our PA on April 24 because she had developed a stomach virus as well as a migraine and had several days of emesis and developed by mouth intolerance. She was able to tolerate liquids but had difficulty with solids. 0.5 cc of fluid was removed from her bandn. She states that she had a time recovering from the stomach bug but she is now back to her baseline. She states that she has increased hunger. She states that she is able to eat larger portions. She no longer feels any restriction. She denies any fevers or chills. She is still working with her Systems analyst. She is also swimming in her swimming pool. She is also walking outside at the gym. She denies any heartburn or indigestion. She denies any nighttime cough. She denies any recent regurgitation PMHx, PSHx, SOCHx, FAMHx, ALL reviewed and unchanged  Review of Systems 8 oint ROS performed and negative except for HPI    Objective:   Physical Exam BP 112/64  Pulse 78  Resp 18  Ht 5\' 7"  (1.702 m)  Wt 222 lb 9.6 oz (100.971 kg)  BMI 34.86 kg/m2  See LapBand flowsheet  Gen: alert, NAD, non-toxic appearing HEENT: normocephalic, atraumatic; pupils equal, no scleral icterus, neck supple, no lymphadenopathy Pulm: Lungs clear to auscultation, symmetric chest rise CV: regular rate and rhythm Abd: soft, nontender, nondistended. Well-healed trocar sites. No incisional hernia. Port is in right mid-abdomen Ext: no edema, normal, symmetric strength Neuro: nonfocal, sensation grossly intact Psych: appropriate, judgment normal     Assessment:     S/p LAGB and LOA 05/22/12 CAD CKD HPL DDD - improved HTN - improved    Plan:     Despite having fluid removed she has  maintained her weight over the past couple weeks. Her weight is unchanged from when I saw her on April 7. Her total weight loss since surgery has been approximately 30 pounds. She has lost over 15 pounds of fat mass per the nutritionist visit yesterday. I congratulated her on maintaining her weight.  We discussed the importance of keeping up with her activity and going to the gym. We discussed the importance of changing the intensity of how she walks such as interval training. We rediscussed the importance of proper eating techniques and eating behaviors. She states that she is taking her multivitamin and supplements as directed.  After obtaining verbal consent, the abdominal wall was prepped with Chloraprep. The port was accessed with a Huber needle and 0.5 cc of saline was added to give the patient an expected fill volume of 3.9 cc.  The patient was able to tolerate sips of water.  She was instructed still liquids for the next 24-48 hours.  Followup with me in 6 weeks  [name]. Andrey Campanile, MD, FACS General, Bariatric, & Minimally Invasive Surgery [facility], [place]

## 2012-10-27 NOTE — Patient Instructions (Signed)
1. Stay on liquids for the next 1-2 days as you adapt to your new fill volume.  Then resume your previous diet. 2. Decreasing your carbohydrate intake will hasten you weight loss.  Rely more on proteins for your meals.  Avoid condiments that contain sweets such as Honey Mustard and sugary salad dressings.   3. Stay in the "green zone".  If you are regurgitating with meals, having night time reflux, and find yourself eating soft comfort foods (mashed potatoes, potato chips)...realize that you are developing "maladaptive eating".  You will not lose weight this way and may regain weight.  The GREEN ZONE is eating smaller portions and not regurgitating.  Hence we may need to withdraw fluid from your band. 4. Build exercise into your daily routine.  Walking is the best way to start but do something every day if you can.  Change the intensity while you are walking. For example, walk at your normal pace for several minutes. Then increase your speed (power walk) for 30-45 sec and then go back down to your normal stride. Several minutes later, repeat the process.  You can also do this on the treadmill 5. Remember chew thoroughly, wait 20-30 sec between bites of food

## 2012-12-07 ENCOUNTER — Encounter (INDEPENDENT_AMBULATORY_CARE_PROVIDER_SITE_OTHER): Payer: Medicare Other | Admitting: General Surgery

## 2012-12-14 ENCOUNTER — Ambulatory Visit (INDEPENDENT_AMBULATORY_CARE_PROVIDER_SITE_OTHER): Payer: Medicare Other | Admitting: Physician Assistant

## 2012-12-14 ENCOUNTER — Encounter (INDEPENDENT_AMBULATORY_CARE_PROVIDER_SITE_OTHER): Payer: Self-pay

## 2012-12-14 DIAGNOSIS — Z4651 Encounter for fitting and adjustment of gastric lap band: Secondary | ICD-10-CM

## 2012-12-14 NOTE — Patient Instructions (Signed)

## 2012-12-14 NOTE — Progress Notes (Signed)
  HISTORY: [name] is a 67 y.o.female who received an AP-Standard lap-band in December 2013 by Dr. Andrey Campanile. She comes in complaining of lack of restriction and being able to eat larger portions than desired. She can't tolerate eggs or dry proteins but otherwise she reports no dysphagia. She would like a fill today.  VITAL SIGNS: Filed Vitals:   12/14/12 1345  BP: 125/64  Pulse: 68  Temp: 98.1 F (36.7 C)  Resp: 14    PHYSICAL EXAM: Physical exam reveals a very well-appearing 67 y.o.female in no apparent distress Neurologic: Awake, alert, oriented Psych: Bright affect, conversant Respiratory: Breathing even and unlabored. No stridor or wheezing Abdomen: Soft, nontender, nondistended to palpation. Incisions well-healed. No incisional hernias. Port easily palpated. Extremities: Atraumatic, good range of motion.  ASSESMENT: 67 y.o.  female  s/p AP-Standard lap-band.   PLAN: The patient's port was accessed with a 20G Huber needle without difficulty. Clear fluid was aspirated and 0.5 mL saline was added to the port to give a total predicted volume of 4.4 mL. The patient was able to swallow water without difficulty following the procedure and was instructed to take clear liquids for the next 24-48 hours and advance slowly as tolerated.

## 2012-12-27 ENCOUNTER — Encounter (INDEPENDENT_AMBULATORY_CARE_PROVIDER_SITE_OTHER): Payer: Medicare Other | Admitting: General Surgery

## 2013-01-11 ENCOUNTER — Encounter (INDEPENDENT_AMBULATORY_CARE_PROVIDER_SITE_OTHER): Payer: Medicare Other

## 2013-01-24 ENCOUNTER — Ambulatory Visit: Payer: Medicare Other | Admitting: *Deleted

## 2013-01-25 ENCOUNTER — Ambulatory Visit: Payer: Medicare Other | Admitting: *Deleted

## 2013-02-08 ENCOUNTER — Encounter (INDEPENDENT_AMBULATORY_CARE_PROVIDER_SITE_OTHER): Payer: Medicare Other

## 2013-02-20 ENCOUNTER — Ambulatory Visit: Payer: Medicare Other | Admitting: *Deleted

## 2013-03-01 ENCOUNTER — Encounter (INDEPENDENT_AMBULATORY_CARE_PROVIDER_SITE_OTHER): Payer: Self-pay

## 2013-03-01 ENCOUNTER — Ambulatory Visit (INDEPENDENT_AMBULATORY_CARE_PROVIDER_SITE_OTHER): Payer: Medicare Other | Admitting: Physician Assistant

## 2013-03-01 DIAGNOSIS — Z6832 Body mass index (BMI) 32.0-32.9, adult: Secondary | ICD-10-CM

## 2013-03-01 DIAGNOSIS — Z9884 Bariatric surgery status: Secondary | ICD-10-CM

## 2013-03-01 NOTE — Progress Notes (Signed)
  HISTORY: [name] is a 67 y.o.female who received an AP-Large lap-band in December 2013 by Dr. Andrey Campanile. She comes in with 12 lbs weight loss since her last appointment in July. She has been exercising daily at the gym, with some days going twice. She has significant improvement with back and knee pain. She is eating small portions and her hunger is under good control. She's eating with a smaller plate (and smaller utensils). She isn't convinced that she needs a fill.  VITAL SIGNS: Filed Vitals:   03/01/13 1006  BP: 120/74  Pulse: 68  Resp: 16    PHYSICAL EXAM: Physical exam reveals a very well-appearing 67 y.o.female in no apparent distress Neurologic: Awake, alert, oriented Psych: Bright affect, conversant Respiratory: Breathing even and unlabored. No stridor or wheezing Extremities: Atraumatic, good range of motion. Skin: Warm, Dry, no rashes Musculoskeletal: Normal gait, Joints normal  ASSESMENT: 67 y.o.  female  s/p AP-Standard lap-band.   PLAN: As she's in the green zone, we deferred a fill today. I don't want to over-restrict her. She voiced understanding and agreement. We'll have her back in one month.

## 2013-03-01 NOTE — Patient Instructions (Signed)
Return in one month. Focus on good food choices as well as physical activity. Return sooner if you have an increase in hunger, portion sizes or weight. Return also for difficulty swallowing, night cough, reflux.   

## 2013-03-02 ENCOUNTER — Encounter: Payer: Medicare Other | Attending: General Surgery | Admitting: *Deleted

## 2013-03-02 ENCOUNTER — Encounter: Payer: Self-pay | Admitting: *Deleted

## 2013-03-02 VITALS — Ht 67.0 in | Wt 210.0 lb

## 2013-03-02 DIAGNOSIS — Z713 Dietary counseling and surveillance: Secondary | ICD-10-CM | POA: Insufficient documentation

## 2013-03-02 DIAGNOSIS — E669 Obesity, unspecified: Secondary | ICD-10-CM | POA: Insufficient documentation

## 2013-03-02 NOTE — Patient Instructions (Addendum)
Goals:  Follow Phase 3B: High Protein + Non-Starchy Vegetables  Increase lean protein foods to meet 60-80g goal  Try Special K high protein cereal or oatmeal  Keep carbs to 15 grams per meal or snack  Avoid drinking 15 minutes before, during and 30 minutes after eating  Add 15 grams of carbohydrate (fruit, whole grain, starchy vegetable) with meal

## 2013-03-02 NOTE — Progress Notes (Signed)
  Follow-up visit: 12 Weeks Post-Operative LAGB Surgery  Medical Nutrition Therapy:  Appt start time: 1600   End time:  1630.  Primary concerns today: Post-operative Bariatric Surgery Nutrition Management.  Surgery date: 05/22/12 Surgery type: LAGB Start weight at [facility]: 251.0 lbs (03/23/12)  Weight today: 210.0 lbs Weight change: 11.0 lbs Total weight lost: 41.0 lbs  Weight goal: 170 lbs % goal met: 51%  TANITA  BODY COMP RESULTS  08/03/12 10/25/12 03/02/13   BMI (kg/m^2) 36.7 34.6 32.9   Fat Mass (lbs) 115.5 103.0 99.5   Fat Free Mass (lbs) 119.0 118.0 110.5   Total Body Water (lbs) 87.0 86.5 81.0   Fluid intake:  64 oz water, 33-44 oz protein shakes = 100-110 oz Estimated total protein intake: 120-140 g  Medications: No changes reported Supplementation: Taking regularly  Using straws: No Drinking while eating: No Hair loss: No Carbonated beverages: No N/V/D/C:  Still cannot tolerate eggs.  Last Lap-Band fill:  12/14/12; 0.5 mL added - now at 4.4 mL. No fill at appt with Mardelle Matte @ [facility] today. Is in the green zone for now.   Recent physical activity:  Goes to gym 5-7 days/week    Progress Towards Goal(s):  In progress.   Nutritional Diagnosis:  [place]-3.3 Overweight/obesity related to past poor dietary habits and physical inactivity as evidenced by patient w/ recent LAGB surgery attempting to follow dietary guidelines for continued weight loss.    Intervention:  Nutrition education/reinforcement  Monitoring/Evaluation:  Dietary intake, exercise, lap band fills, and body weight. Follow up in 3 months for 12 month post-op visit or sooner if needed.

## 2013-03-13 ENCOUNTER — Encounter (INDEPENDENT_AMBULATORY_CARE_PROVIDER_SITE_OTHER): Payer: Self-pay

## 2013-03-15 ENCOUNTER — Encounter (INDEPENDENT_AMBULATORY_CARE_PROVIDER_SITE_OTHER): Payer: Medicare Other

## 2013-04-02 ENCOUNTER — Encounter: Payer: Self-pay | Admitting: Cardiology

## 2013-04-02 ENCOUNTER — Ambulatory Visit (INDEPENDENT_AMBULATORY_CARE_PROVIDER_SITE_OTHER): Payer: Medicare Other | Admitting: Cardiology

## 2013-04-02 VITALS — BP 128/82 | HR 61 | Ht 67.0 in | Wt 210.0 lb

## 2013-04-02 DIAGNOSIS — I2510 Atherosclerotic heart disease of native coronary artery without angina pectoris: Secondary | ICD-10-CM

## 2013-04-02 DIAGNOSIS — I10 Essential (primary) hypertension: Secondary | ICD-10-CM

## 2013-04-02 DIAGNOSIS — E785 Hyperlipidemia, unspecified: Secondary | ICD-10-CM

## 2013-04-02 DIAGNOSIS — Z0000 Encounter for general adult medical examination without abnormal findings: Secondary | ICD-10-CM

## 2013-04-02 NOTE — Assessment & Plan Note (Signed)
Continue statin. Lipids and liver monitored by primary care. 

## 2013-04-02 NOTE — Progress Notes (Signed)
HPI: Ms. Redditt is a pleasant female that I saw in the past for an abnormal nuclear study performed in December of 2009. There was mild anterior ischemia, although shifting breast attenuation could not be excluded. Her ejection fraction was 58%. However, she did have some chest pain and we therefore wanted definitive evaluation. She, therefore, underwent a cardiac catheterization on June 18, 2008. She was found to have no obstructive coronary artery disease with the most significant being a 50-60% stenosis in the proximal right coronary artery. Her ejection fraction was 50-55%. Stress echocardiogram in August of 2010 was normal. I last saw her in Oct 2013. Since then she denies any dyspnea on exertion, orthopnea, PND, pedal edema, palpitations, syncope or chest pain.   Current Outpatient Prescriptions  Medication Sig Dispense Refill  . amLODipine (NORVASC) 5 MG tablet Take 1 tablet (5 mg total) by mouth daily.  30 tablet  1  . aspirin 81 MG EC tablet Take 81 mg by mouth every morning.       Marland Kitchen buPROPion (WELLBUTRIN) 75 MG tablet Take 1 tablet (75 mg total) by mouth 2 (two) times daily.  60 tablet  1  . Calcium Carbonate-Vitamin D (CALTRATE 600+D) 600-400 MG-UNIT per tablet Take 1 tablet by mouth every morning.       . Cholecalciferol (VITAMIN D3) 1000 UNITS CAPS Take 1 capsule by mouth daily after lunch.       . clonazePAM (KLONOPIN) 2 MG tablet Take 2 mg by mouth at bedtime.      Marland Kitchen doxepin (SINEQUAN) 25 MG capsule Take 50 mg by mouth at bedtime.       . DULoxetine (CYMBALTA) 30 MG capsule Take 30 mg by mouth 3 (three) times daily.       Marland Kitchen levothyroxine (SYNTHROID, LEVOTHROID) 75 MCG tablet Take 75 mcg by mouth daily before breakfast.       . Multiple Vitamin (MULTIVITAMIN) tablet Take 1 tablet by mouth daily.      . NON FORMULARY Take 1 capsule by mouth daily at 3 pm. MEGA RED CAP      . pregabalin (LYRICA) 75 MG capsule Take 75 mg by mouth 2 (two) times daily.      Marland Kitchen pyridOXINE  (VITAMIN B-6) 100 MG tablet Take 100 mg by mouth every morning.       . rosuvastatin (CRESTOR) 20 MG tablet Take 20 mg by mouth at bedtime.       . vitamin B-12 (CYANOCOBALAMIN) 1000 MCG tablet Take 1,000 mcg by mouth every evening.        No current facility-administered medications for this visit.     Past Medical History  Diagnosis Date  . Palpitations     history  . Depression   . Hyperlipidemia   . Hypertension   . Hypothyroidism   . Obesity (BMI 30-39.9)   . CAD (coronary artery disease)   . Chronic kidney disease     normal kidney function; left kidney removed 2012  . Arthritis   . Fibromyalgia   . PONV (postoperative nausea and vomiting)     nausea only    Past Surgical History  Procedure Laterality Date  . Appendectomy    . Cholecystectomy    . Tonsillectomy    . Total knee arthroplasty  2003 (R); 2010 (L); 2012 (L clean-out)    Bilateral  . Laparoscopic incisional / umbilical / ventral hernia repair  2004    upper midline hernia, 15 x 19cm dual Goretex  mesh  . Left nephrectomy  2012    noncancerous mass, [place]  . Total abdominal hysterectomy  1978  . Laparoscopic gastric banding  05/22/2012    Procedure: LAPAROSCOPIC GASTRIC BANDING;  Surgeon: Atilano Ina, MD,FACS;  Location: [facility];  Service: General;  Laterality: N/A;  with mesh to bandport  . Laparoscopic lysis of adhesions  05/22/2012    Procedure: LAPAROSCOPIC LYSIS OF ADHESIONS;  Surgeon: Atilano Ina, MD,FACS;  Location: [facility];  Service: General;  Laterality: N/A;  For 30 minutes  . Mesh applied to lap port  05/22/2012    Procedure: MESH APPLIED TO LAP PORT;  Surgeon: Atilano Ina, MD,FACS;  Location: [facility];  Service: General;;    History   Social History  . Marital Status: Married    Spouse Name: N/A    Number of Children: N/A  . Years of Education: N/A   Occupational History  . Full Time    Social History Main Topics  . Smoking status: Never Smoker   . Smokeless tobacco: Never Used    . Alcohol Use: No  . Drug Use: No  . Sexual Activity: Not on file   Other Topics Concern  . Not on file   Social History Narrative  . No narrative on file    ROS: no fevers or chills, productive cough, hemoptysis, dysphasia, odynophagia, melena, hematochezia, dysuria, hematuria, rash, seizure activity, orthopnea, PND, pedal edema, claudication. Remaining systems are negative.  Physical Exam: Well-developed well-nourished in no acute distress.  Skin is warm and dry.  HEENT is normal.  Neck is supple.  Chest is clear to auscultation with normal expansion.  Cardiovascular exam is regular rate and rhythm.  Abdominal exam nontender or distended. No masses palpated. Extremities show no edema. neuro grossly intact  ECG sinus rhythm at a rate of 61. Left axis deviation. Nonspecific ST changes.

## 2013-04-02 NOTE — Assessment & Plan Note (Signed)
Continue aspirin and statin. 

## 2013-04-02 NOTE — Patient Instructions (Signed)
Your physician wants you to follow-up in: ONE YEAR WITH DR CRENSHAW You will receive a reminder letter in the mail two months in advance. If you don't receive a letter, please call our office to schedule the follow-up appointment.  

## 2013-04-02 NOTE — Assessment & Plan Note (Signed)
Blood pressure controlled. Continue present medications. Potassium and renal function monitored by primary care. 

## 2013-04-05 ENCOUNTER — Encounter (INDEPENDENT_AMBULATORY_CARE_PROVIDER_SITE_OTHER): Payer: Medicare Other

## 2013-04-12 ENCOUNTER — Encounter (INDEPENDENT_AMBULATORY_CARE_PROVIDER_SITE_OTHER): Payer: Medicare Other

## 2013-04-26 ENCOUNTER — Ambulatory Visit (INDEPENDENT_AMBULATORY_CARE_PROVIDER_SITE_OTHER): Payer: Medicare Other | Admitting: Physician Assistant

## 2013-04-26 ENCOUNTER — Emergency Department (HOSPITAL_COMMUNITY): Payer: Medicare Other

## 2013-04-26 ENCOUNTER — Encounter (INDEPENDENT_AMBULATORY_CARE_PROVIDER_SITE_OTHER): Payer: Self-pay

## 2013-04-26 ENCOUNTER — Inpatient Hospital Stay (HOSPITAL_COMMUNITY)
Admission: EM | Admit: 2013-04-26 | Discharge: 2013-04-27 | DRG: 195 | Disposition: A | Discharge status: Home / Self Care | Payer: Medicare Other | Attending: Internal Medicine | Admitting: Internal Medicine

## 2013-04-26 ENCOUNTER — Encounter (HOSPITAL_COMMUNITY): Payer: Self-pay | Admitting: Emergency Medicine

## 2013-04-26 VITALS — BP 140/82 | HR 80 | Temp 98.1°F | Resp 16 | Ht 67.0 in | Wt 210.0 lb

## 2013-04-26 DIAGNOSIS — F329 Major depressive disorder, single episode, unspecified: Secondary | ICD-10-CM | POA: Diagnosis present

## 2013-04-26 DIAGNOSIS — E785 Hyperlipidemia, unspecified: Secondary | ICD-10-CM | POA: Diagnosis present

## 2013-04-26 DIAGNOSIS — F32A Depression, unspecified: Secondary | ICD-10-CM | POA: Diagnosis present

## 2013-04-26 DIAGNOSIS — Z9884 Bariatric surgery status: Secondary | ICD-10-CM

## 2013-04-26 DIAGNOSIS — R079 Chest pain, unspecified: Secondary | ICD-10-CM

## 2013-04-26 DIAGNOSIS — I129 Hypertensive chronic kidney disease with stage 1 through stage 4 chronic kidney disease, or unspecified chronic kidney disease: Secondary | ICD-10-CM | POA: Diagnosis present

## 2013-04-26 DIAGNOSIS — R0600 Dyspnea, unspecified: Secondary | ICD-10-CM

## 2013-04-26 DIAGNOSIS — Z7982 Long term (current) use of aspirin: Secondary | ICD-10-CM

## 2013-04-26 DIAGNOSIS — J189 Pneumonia, unspecified organism: Principal | ICD-10-CM | POA: Diagnosis present

## 2013-04-26 DIAGNOSIS — Z96659 Presence of unspecified artificial knee joint: Secondary | ICD-10-CM

## 2013-04-26 DIAGNOSIS — IMO0001 Reserved for inherently not codable concepts without codable children: Secondary | ICD-10-CM | POA: Diagnosis present

## 2013-04-26 DIAGNOSIS — I2510 Atherosclerotic heart disease of native coronary artery without angina pectoris: Secondary | ICD-10-CM | POA: Diagnosis present

## 2013-04-26 DIAGNOSIS — E669 Obesity, unspecified: Secondary | ICD-10-CM | POA: Diagnosis present

## 2013-04-26 DIAGNOSIS — F3289 Other specified depressive episodes: Secondary | ICD-10-CM | POA: Diagnosis present

## 2013-04-26 DIAGNOSIS — N189 Chronic kidney disease, unspecified: Secondary | ICD-10-CM | POA: Diagnosis present

## 2013-04-26 DIAGNOSIS — I10 Essential (primary) hypertension: Secondary | ICD-10-CM | POA: Diagnosis present

## 2013-04-26 DIAGNOSIS — R0609 Other forms of dyspnea: Secondary | ICD-10-CM

## 2013-04-26 DIAGNOSIS — Z79899 Other long term (current) drug therapy: Secondary | ICD-10-CM

## 2013-04-26 DIAGNOSIS — M129 Arthropathy, unspecified: Secondary | ICD-10-CM | POA: Diagnosis present

## 2013-04-26 DIAGNOSIS — E039 Hypothyroidism, unspecified: Secondary | ICD-10-CM | POA: Diagnosis present

## 2013-04-26 LAB — CBC WITH DIFFERENTIAL/PLATELET
Basophils Absolute: 0 10*3/uL (ref 0.0–0.1)
Eosinophils Relative: 5 % (ref 0–5)
Lymphocytes Relative: 25 % (ref 12–46)
Lymphs Abs: 2 10*3/uL (ref 0.7–4.0)
MCV: 87.8 fL (ref 78.0–100.0)
Neutro Abs: 5.1 10*3/uL (ref 1.7–7.7)
Neutrophils Relative %: 63 % (ref 43–77)
Platelets: 244 10*3/uL (ref 150–400)
RBC: 4.5 MIL/uL (ref 3.87–5.11)
RDW: 13.6 % (ref 11.5–15.5)
WBC: 8 10*3/uL (ref 4.0–10.5)

## 2013-04-26 LAB — BASIC METABOLIC PANEL
CO2: 26 mEq/L (ref 19–32)
Chloride: 104 mEq/L (ref 96–112)
Glucose, Bld: 89 mg/dL (ref 70–99)
Potassium: 4.1 mEq/L (ref 3.5–5.1)
Sodium: 139 mEq/L (ref 135–145)

## 2013-04-26 LAB — STREP PNEUMONIAE URINARY ANTIGEN: Strep Pneumo Urinary Antigen: NEGATIVE

## 2013-04-26 LAB — CREATININE, SERUM
Creatinine, Ser: 1.02 mg/dL (ref 0.50–1.10)
GFR calc non Af Amer: 56 mL/min — ABNORMAL LOW (ref >90)

## 2013-04-26 LAB — CBC
MCH: 29.7 pg (ref 26.0–34.0)
MCHC: 33.9 g/dL (ref 30.0–36.0)
RDW: 13.6 % (ref 11.5–15.5)

## 2013-04-26 LAB — URINALYSIS W MICROSCOPIC + REFLEX CULTURE
Bilirubin Urine: NEGATIVE
Glucose, UA: NEGATIVE mg/dL
Hgb urine dipstick: NEGATIVE
Specific Gravity, Urine: 1.013 (ref 1.005–1.030)
Urobilinogen, UA: 1 mg/dL (ref 0.0–1.0)

## 2013-04-26 LAB — TROPONIN I: Troponin I: <0.3 ng/mL (ref <0.30)

## 2013-04-26 LAB — PRO B NATRIURETIC PEPTIDE: Pro B Natriuretic peptide (BNP): 306.9 pg/mL — ABNORMAL HIGH (ref 0–125)

## 2013-04-26 MED ORDER — PREGABALIN 75 MG PO CAPS
75.0000 mg | ORAL_CAPSULE | Freq: Two times a day (BID) | ORAL | Status: DC
  Administered 2013-04-26 – 2013-04-27 (×2): 75 mg via ORAL
  Filled 2013-04-26 (×2): qty 1

## 2013-04-26 MED ORDER — IPRATROPIUM BROMIDE 0.02 % IN SOLN
0.5000 mg | Freq: Once | RESPIRATORY_TRACT | Status: AC
  Administered 2013-04-26: 0.5 mg via RESPIRATORY_TRACT
  Filled 2013-04-26: qty 2.5

## 2013-04-26 MED ORDER — LEVOFLOXACIN 750 MG PO TABS
750.0000 mg | ORAL_TABLET | Freq: Every day | ORAL | Status: DC
  Filled 2013-04-26: qty 1

## 2013-04-26 MED ORDER — ACETAMINOPHEN 650 MG RE SUPP: 650.0000 mg | Freq: Four times a day (QID) | RECTAL | Status: DC | PRN

## 2013-04-26 MED ORDER — TECHNETIUM TO 99M ALBUMIN AGGREGATED
6.0000 | Freq: Once | INTRAVENOUS | Status: AC | PRN
  Administered 2013-04-26: 6 via INTRAVENOUS

## 2013-04-26 MED ORDER — DULOXETINE HCL 30 MG PO CPEP
30.0000 mg | ORAL_CAPSULE | Freq: Three times a day (TID) | ORAL | Status: DC
Start: 2013-04-26 — End: 2013-04-27
  Administered 2013-04-26 – 2013-04-27 (×3): 30 mg via ORAL
  Filled 2013-04-26 (×4): qty 1

## 2013-04-26 MED ORDER — LEVOFLOXACIN IN D5W 750 MG/150ML IV SOLN
750.0000 mg | Freq: Once | INTRAVENOUS | Status: AC
  Administered 2013-04-26: 750 mg via INTRAVENOUS
  Filled 2013-04-26: qty 150

## 2013-04-26 MED ORDER — DOXEPIN HCL 50 MG PO CAPS
50.0000 mg | ORAL_CAPSULE | Freq: Every day | ORAL | Status: DC
  Administered 2013-04-26: 50 mg via ORAL
  Filled 2013-04-26 (×2): qty 1

## 2013-04-26 MED ORDER — AMLODIPINE BESYLATE 5 MG PO TABS
5.0000 mg | ORAL_TABLET | Freq: Every day | ORAL | Status: DC
  Administered 2013-04-27: 5 mg via ORAL
  Filled 2013-04-26: qty 1

## 2013-04-26 MED ORDER — TECHNETIUM TC 99M DIETHYLENETRIAME-PENTAACETIC ACID: 40.0000 | Freq: Once | INTRAVENOUS | Status: AC | PRN

## 2013-04-26 MED ORDER — BUPROPION HCL 75 MG PO TABS
75.0000 mg | ORAL_TABLET | Freq: Two times a day (BID) | ORAL | Status: DC
  Administered 2013-04-26 – 2013-04-27 (×2): 75 mg via ORAL
  Filled 2013-04-26 (×3): qty 1

## 2013-04-26 MED ORDER — ALBUTEROL SULFATE (5 MG/ML) 0.5% IN NEBU
2.5000 mg | INHALATION_SOLUTION | Freq: Four times a day (QID) | RESPIRATORY_TRACT | Status: AC
  Administered 2013-04-26: 2.5 mg via RESPIRATORY_TRACT
  Filled 2013-04-26: qty 0.5

## 2013-04-26 MED ORDER — VITAMIN B-12 1000 MCG PO TABS
1000.0000 ug | ORAL_TABLET | Freq: Every evening | ORAL | Status: DC
  Administered 2013-04-26: 21:00:00 1000 ug via ORAL
  Filled 2013-04-26 (×2): qty 1

## 2013-04-26 MED ORDER — LEVOFLOXACIN 750 MG PO TABS
750.0000 mg | ORAL_TABLET | ORAL | Status: DC
  Administered 2013-04-27: 16:00:00 750 mg via ORAL
  Filled 2013-04-26: qty 1

## 2013-04-26 MED ORDER — CALCIUM CARBONATE-VITAMIN D 500-200 MG-UNIT PO TABS
1.0000 | ORAL_TABLET | Freq: Every morning | ORAL | Status: DC
  Administered 2013-04-27: 1 via ORAL
  Filled 2013-04-26: qty 1

## 2013-04-26 MED ORDER — ENOXAPARIN SODIUM 40 MG/0.4ML ~~LOC~~ SOLN
40.0000 mg | SUBCUTANEOUS | Status: DC
  Administered 2013-04-26: 21:00:00 40 mg via SUBCUTANEOUS
  Filled 2013-04-26 (×2): qty 0.4

## 2013-04-26 MED ORDER — ALBUTEROL SULFATE (5 MG/ML) 0.5% IN NEBU
5.0000 mg | INHALATION_SOLUTION | Freq: Once | RESPIRATORY_TRACT | Status: AC
  Administered 2013-04-26: 5 mg via RESPIRATORY_TRACT
  Filled 2013-04-26: qty 1

## 2013-04-26 MED ORDER — ASPIRIN EC 81 MG PO TBEC
81.0000 mg | DELAYED_RELEASE_TABLET | Freq: Every morning | ORAL | Status: DC
  Administered 2013-04-26 – 2013-04-27 (×2): 81 mg via ORAL
  Filled 2013-04-26 (×2): qty 1

## 2013-04-26 MED ORDER — LEVOTHYROXINE SODIUM 75 MCG PO TABS
75.0000 ug | ORAL_TABLET | Freq: Every day | ORAL | Status: DC
  Administered 2013-04-27: 75 ug via ORAL
  Filled 2013-04-26 (×2): qty 1

## 2013-04-26 MED ORDER — SENNOSIDES-DOCUSATE SODIUM 8.6-50 MG PO TABS: 1.0000 | ORAL_TABLET | Freq: Every evening | ORAL | Status: DC | PRN

## 2013-04-26 MED ORDER — VITAMIN B-6 100 MG PO TABS
100.0000 mg | ORAL_TABLET | Freq: Every morning | ORAL | Status: DC
  Administered 2013-04-27: 100 mg via ORAL
  Filled 2013-04-26: qty 1

## 2013-04-26 MED ORDER — ATORVASTATIN CALCIUM 20 MG PO TABS
20.0000 mg | ORAL_TABLET | Freq: Every day | ORAL | Status: DC
  Administered 2013-04-26: 20 mg via ORAL
  Filled 2013-04-26 (×2): qty 1

## 2013-04-26 MED ORDER — CLONAZEPAM 1 MG PO TABS
2.0000 mg | ORAL_TABLET | Freq: Every day | ORAL | Status: DC
  Administered 2013-04-26: 2 mg via ORAL
  Filled 2013-04-26: qty 2

## 2013-04-26 MED ORDER — SODIUM CHLORIDE 0.9 % IV SOLN
INTRAVENOUS | Status: DC
  Administered 2013-04-26: 13:00:00 via INTRAVENOUS

## 2013-04-26 MED ORDER — ACETAMINOPHEN 325 MG PO TABS: 650.0000 mg | ORAL_TABLET | Freq: Four times a day (QID) | ORAL | Status: DC | PRN

## 2013-04-26 MED ORDER — DM-GUAIFENESIN ER 30-600 MG PO TB12
2.0000 | ORAL_TABLET | Freq: Two times a day (BID) | ORAL | Status: DC
  Administered 2013-04-26 – 2013-04-27 (×2): 2 via ORAL
  Filled 2013-04-26 (×3): qty 2

## 2013-04-26 NOTE — Progress Notes (Signed)
  HISTORY: [name] is a 67 y.o.female who received an AP-Standard lap-band in December 2013 by Dr. Andrey Campanile. She comes in today with complaints of hunger and increased portion sizes. Unfortunately over the past 2 days, she's experienced shortness of breath and chest tightness. Per her history, she contacted her PCP office yesterday which instructed her to go to the ED, which she declined. She's taken two doses of mucinex which has given her little relief. She denies fever or cough. She also denies regurgitation or nocturnal reflux. She has no complaint of calf swelling or pain. She does not smoke.  VITAL SIGNS: Filed Vitals:   04/26/13 1020  BP: 140/82  Pulse: 80  Temp: 98.1 F (36.7 C)  Resp: 16    PHYSICAL EXAM: Physical exam reveals a very well-appearing 67 y.o.female in no apparent distress Neurologic: Awake, alert, oriented Psych: Bright affect, conversant Respiratory: No stridor. Unable to complete sentences without trying to catch her breath. Lungs clear to auscultation bilaterally. No crackles or wheezes. CV: RRR no M/G/R. Extremities: Atraumatic, good range of motion. No distal edema. Calves symmetric Skin: Warm, Dry, no rashes Musculoskeletal: Normal gait, Joints normal Pulse oximetry: 92% on room air - abnormal.  ASSESMENT: 67 y.o.  female  s/p AP-Standard lap-band, now with a 2-day complaint of dyspnea, differential including pneumonia, bronchitis or PE.   PLAN: I spoke with her PCP, Dr. Sudie Bailey at 10:38 am. After discussion of our findings, she recommended directing the patient to the ED for further evaluation. I agree. We discussed this with the patient who agreed to go to the ED after discharge from the office. We will see her next week if everything checks out OK to address her hunger.

## 2013-04-26 NOTE — Progress Notes (Signed)
Patient arrived to unit on dayshift at 13. Patient lives at home with her husband. Patient is A&Ox3. Patient's sacrum is a little red but blanchable. Patient oriented to unit and room. Will continue to monitor patient. Nelda Marseille, RN

## 2013-04-26 NOTE — ED Provider Notes (Signed)
CSN: [id number]     Arrival date & time 04/26/13  1118 History   First MD Initiated Contact with Patient 04/26/13 1141     Chief Complaint  Patient presents with  . Shortness of Breath    HPI Pt was seen at 1205. Per pt and her husband, c/o gradual onset and worsening of persistent SOB for the past 2 to 3 days. Has been associated with occasional non-productive cough. SOB worsens with ambulation. Denies CP/palpitations, no fevers, no back pain, no abd pain, no N/V/D, no rash, no recent long travel or immobility, no calf/LE pain or unilateral swelling. Denies previous hx hypercoagulability. Unk FHx.    Past Medical History  Diagnosis Date  . Palpitations     history  . Depression   . Hyperlipidemia   . Hypertension   . Hypothyroidism   . Obesity (BMI 30-39.9)   . CAD (coronary artery disease)   . Chronic kidney disease     normal kidney function; left kidney removed 2012  . Arthritis   . Fibromyalgia   . PONV (postoperative nausea and vomiting)     nausea only   Past Surgical History  Procedure Laterality Date  . Appendectomy    . Cholecystectomy    . Tonsillectomy    . Total knee arthroplasty  2003 (R); 2010 (L); 2012 (L clean-out)    Bilateral  . Laparoscopic incisional / umbilical / ventral hernia repair  2004    upper midline hernia, 15 x 19cm dual Goretex mesh  . Left nephrectomy  2012    noncancerous mass, [place]  . Total abdominal hysterectomy  1978  . Laparoscopic gastric banding  05/22/2012    Procedure: LAPAROSCOPIC GASTRIC BANDING;  Surgeon: Atilano Ina, MD,FACS;  Location: [facility];  Service: General;  Laterality: N/A;  with mesh to bandport  . Laparoscopic lysis of adhesions  05/22/2012    Procedure: LAPAROSCOPIC LYSIS OF ADHESIONS;  Surgeon: Atilano Ina, MD,FACS;  Location: [facility];  Service: General;  Laterality: N/A;  For 30 minutes  . Mesh applied to lap port  05/22/2012    Procedure: MESH APPLIED TO LAP PORT;  Surgeon: Atilano Ina, MD,FACS;  Location:  [facility];  Service: General;;   Family History  Problem Relation Age of Onset  . Adopted: Yes   History  Substance Use Topics  . Smoking status: Never Smoker   . Smokeless tobacco: Never Used  . Alcohol Use: No    Review of Systems ROS: Statement: All systems negative except as marked or noted in the HPI; Constitutional: Negative for fever and chills. ; ; Eyes: Negative for eye pain, redness and discharge. ; ; ENMT: Negative for ear pain, hoarseness, nasal congestion, sinus pressure and sore throat. ; ; Cardiovascular: +SOB. Negative for chest pain, palpitations, diaphoresis, and peripheral edema. ; ; Respiratory: +cough. Negative for wheezing and stridor. ; ; Gastrointestinal: Negative for nausea, vomiting, diarrhea, abdominal pain, blood in stool, hematemesis, jaundice and rectal bleeding. . ; ; Genitourinary: Negative for dysuria, flank pain and hematuria. ; ; Musculoskeletal: Negative for back pain and neck pain. Negative for swelling and trauma.; ; Skin: Negative for pruritus, rash, abrasions, blisters, bruising and skin lesion.; ; Neuro: Negative for headache, lightheadedness and neck stiffness. Negative for weakness, altered level of consciousness , altered mental status, extremity weakness, paresthesias, involuntary movement, seizure and syncope.      Allergies  Review of patient's allergies indicates no known allergies.  Home Medications   Current Outpatient Rx  Name  Route  Sig  Dispense  Refill  . amLODipine (NORVASC) 5 MG tablet   Oral   Take 1 tablet (5 mg total) by mouth daily.   30 tablet   1   . aspirin 81 MG EC tablet   Oral   Take 81 mg by mouth every morning.          Marland Kitchen buPROPion (WELLBUTRIN) 75 MG tablet   Oral   Take 1 tablet (75 mg total) by mouth 2 (two) times daily.   60 tablet   1   . Calcium Carbonate-Vitamin D (CALTRATE 600+D) 600-400 MG-UNIT per tablet   Oral   Take 1 tablet by mouth every morning.          . Cholecalciferol (VITAMIN D3) 1000  UNITS CAPS   Oral   Take 1 capsule by mouth daily after lunch.          . clonazePAM (KLONOPIN) 2 MG tablet   Oral   Take 2 mg by mouth at bedtime.         Marland Kitchen dextromethorphan-guaiFENesin (MUCINEX DM) 30-600 MG per 12 hr tablet   Oral   Take 2 tablets by mouth 2 (two) times daily.         Marland Kitchen doxepin (SINEQUAN) 25 MG capsule   Oral   Take 50 mg by mouth at bedtime.          . DULoxetine (CYMBALTA) 30 MG capsule   Oral   Take 30 mg by mouth 3 (three) times daily.          Marland Kitchen levothyroxine (SYNTHROID, LEVOTHROID) 75 MCG tablet   Oral   Take 75 mcg by mouth daily before breakfast. Pt gets brand name         . Multiple Vitamin (MULTIVITAMIN) tablet   Oral   Take 1 tablet by mouth daily.         . NON FORMULARY   Oral   Take 1 capsule by mouth daily at 3 pm. MEGA RED CAP         . pregabalin (LYRICA) 75 MG capsule   Oral   Take 75 mg by mouth 2 (two) times daily.         Marland Kitchen pyridOXINE (VITAMIN B-6) 100 MG tablet   Oral   Take 100 mg by mouth every morning.          . rosuvastatin (CRESTOR) 20 MG tablet   Oral   Take 20 mg by mouth at bedtime.          . vitamin B-12 (CYANOCOBALAMIN) 1000 MCG tablet   Oral   Take 1,000 mcg by mouth every evening.           BP 117/65  Pulse 65  Temp(Src) 98.2 F (36.8 C) (Oral)  Resp 15  Ht 5\' 7"  (1.702 m)  Wt 209 lb 11.2 oz (95.119 kg)  BMI 32.84 kg/m2  SpO2 94% Physical Exam 1210: Physical examination:  Nursing notes reviewed; Vital signs and O2 SAT reviewed;  Constitutional: Well developed, Well nourished, Well hydrated, Uncomfortable appearing; Head:  Normocephalic, atraumatic; Eyes: EOMI, PERRL, No scleral icterus; ENMT: TM's clear bilat. +edemetous nasal turbinates bilat with clear rhinorrhea. Mouth and pharynx normal, Mucous membranes moist; Neck: Supple, Full range of motion, No lymphadenopathy; Cardiovascular: Regular rate and rhythm, No gallop; Respiratory: Breath sounds coarse & equal bilaterally, No  wheezes. Speaking in short phrases, tachypneic. No retrax. Sitting upright.; Chest: Nontender, Movement normal; Abdomen: Soft, Nontender, Nondistended,  Normal bowel sounds; Genitourinary: No CVA tenderness; Extremities: Pulses normal, No tenderness, No edema, No calf edema or asymmetry.; Neuro: AA&Ox3, Major CN grossly intact.  Speech clear. No gross focal motor or sensory deficits in extremities.; Skin: Color normal, Warm, Dry.   ED Course  Procedures   EKG Interpretation     Ventricular Rate:  83 PR Interval:  146 QRS Duration: 86 QT Interval:  394 QTC Calculation: 462 R Axis:   -34 Text Interpretation:  Normal sinus rhythm Artifact Left axis deviation Cannot rule out Anterior infarct , age undetermined Nonspecific T wave abnormality Abnormal ECG T wave abnormality leads V5, V6 new since previous EKG dated 03/25/2003            MDM  MDM Reviewed: previous chart, nursing note and vitals Reviewed previous: labs and ECG Interpretation: labs, ECG and x-ray     Results for orders placed during the hospital encounter of 04/26/13  CBC WITH DIFFERENTIAL      Result Value Range   WBC 8.0  4.0 - 10.5 K/uL   RBC 4.50  3.87 - 5.11 MIL/uL   Hemoglobin 13.4  12.0 - 15.0 g/dL   HCT 16.1  09.6 - 04.5 %   MCV 87.8  78.0 - 100.0 fL   MCH 29.8  26.0 - 34.0 pg   MCHC 33.9  30.0 - 36.0 g/dL   RDW 40.9  81.1 - 91.4 %   Platelets 244  150 - 400 K/uL   Neutrophils Relative % 63  43 - 77 %   Neutro Abs 5.1  1.7 - 7.7 K/uL   Lymphocytes Relative 25  12 - 46 %   Lymphs Abs 2.0  0.7 - 4.0 K/uL   Monocytes Relative 7  3 - 12 %   Monocytes Absolute 0.6  0.1 - 1.0 K/uL   Eosinophils Relative 5  0 - 5 %   Eosinophils Absolute 0.4  0.0 - 0.7 K/uL   Basophils Relative 0  0 - 1 %   Basophils Absolute 0.0  0.0 - 0.1 K/uL  BASIC METABOLIC PANEL      Result Value Range   Sodium 139  135 - 145 mEq/L   Potassium 4.1  3.5 - 5.1 mEq/L   Chloride 104  96 - 112 mEq/L   CO2 26  19 - 32 mEq/L    Glucose, Bld 89  70 - 99 mg/dL   BUN 10  6 - 23 mg/dL   Creatinine, Ser 7.82  0.50 - 1.10 mg/dL   Calcium 9.6  8.4 - 95.6 mg/dL   GFR calc non Af Amer 53 (*) >90 mL/min   GFR calc Af Amer 62 (*) >90 mL/min  TROPONIN I      Result Value Range   Troponin I <0.30  <0.30 ng/mL  D-DIMER, QUANTITATIVE      Result Value Range   D-Dimer, Quant 0.95 (*) 0.00 - 0.48 ug/mL-FEU   Dg Chest 2 View 04/26/2013   CLINICAL DATA:  Shortness of breath.  EXAM: CHEST  2 VIEW  COMPARISON:  04/05/2012.  FINDINGS: Mediastinum and hilar structures are unremarkable. Prominent bibasilar atelectasis and infiltrates are present. Underlying pneumonia is most likely present. Tiny right pleural effusion cannot be excluded. No pneumothorax. Postsurgical changes noted of the upper abdomen. Diffuse degenerative changes thoracic spine.  IMPRESSION: Bibasilar atelectasis and pneumonia.   Electronically Signed   By: Maisie Fus  Register   On: 04/26/2013 14:02   Nm Pulmonary Perf And Vent 04/26/2013  CLINICAL DATA:  Shortness of breath for 3 days.  EXAM: NUCLEAR MEDICINE VENTILATION - PERFUSION LUNG SCAN  TECHNIQUE: Ventilation images were obtained in multiple projections using inhaled aerosol technetium 99 M DTPA. Perfusion images were obtained in multiple projections after intravenous injection of Tc-60m MAA.  COMPARISON:  None.  RADIOPHARMACEUTICALS:  40 mCi Tc-57m DTPA aerosol and 6 mCi Tc-15m MAA  FINDINGS: Ventilation: No focal ventilation defect.  Perfusion: No wedge shaped peripheral perfusion defects to suggest acute pulmonary embolism.  IMPRESSION: Negative exam.   Electronically Signed   By: Drusilla Kanner M.D.   On: 04/26/2013 15:54     1510:  Pt tachypneic on arrival to ED. Given short neb with mild improvement. Sats remain 92-94% R/A. Will start IV abx for CAP. Pt will have V/Q scan vs CT-A to r/o PE given elevated d-dimer given hx of left nephrectomy with decreased GFR. Dx and testing d/w pt and family.  Questions  answered.  Verb understanding, agreeable to admit. T/C to [facility] Dr. Lendell Caprice, case discussed, including:  HPI, pertinent PM/SHx, VS/PE, dx testing, ED course and treatment:  Agreeable to admit, requests to write temporary orders, obtain regular bed to team 10.   Laray Anger, DO 04/29/13 [phone]

## 2013-04-26 NOTE — ED Notes (Addendum)
Pt reports sob x 2 days with dry cough, denies swelling to extremities, denies cp. ekg done at triage. Pt was sent here from [place]. Pt reports going there today for appt to adjust lap band, which they did not complete due to pts sob, spo2 96% at triage.

## 2013-04-26 NOTE — Patient Instructions (Signed)
Per recommendation from Dr. Sudie Bailey, go immediately to the emergency department for evaluation and treatment. Return to see [place] next week.

## 2013-04-26 NOTE — H&P (Signed)
[facility] History and Physical  [name] ZOX:[id number] DOB: 1945/08/05 DOA: 04/26/2013  Referring physician: Clarene Duke PCP: Philemon Kingdom, MD   Chief Complaint: shortness of breath  HPI: [name] is a 67 y.o. female presents with several day h/o dysnpea, mild nonproductive cough. Thought she might have a cold.  No CP, no F,C, sore throat, rhinorrhea, sick contacts.  In ED, normal vitals, CXR showed probable pneumonia and D dimer 0.95.  Patient reports the bronchidilator treatment in ED helped.  No h/o COPD, asthma or smoking.  Review of Systems: systems reviewed.  As above, otherwise negative.  Past Medical History  Diagnosis Date  . Palpitations     history  . Depression   . Hyperlipidemia   . Hypertension   . Hypothyroidism   . Obesity (BMI 30-39.9)   . CAD (coronary artery disease)   . Chronic kidney disease     normal kidney function; left kidney removed 2012  . Arthritis   . Fibromyalgia   . PONV (postoperative nausea and vomiting)     nausea only   Past Surgical History  Procedure Laterality Date  . Appendectomy    . Cholecystectomy    . Tonsillectomy    . Total knee arthroplasty  2003 (R); 2010 (Drake); 2012 (Drake clean-out)    Bilateral  . Laparoscopic incisional / umbilical / ventral hernia repair  2004    upper midline hernia, 15 x 19cm dual Goretex mesh  . Left nephrectomy  2012    noncancerous mass, [place]  . Total abdominal hysterectomy  1978  . Laparoscopic gastric banding  05/22/2012    Procedure: LAPAROSCOPIC GASTRIC BANDING;  Surgeon: Atilano Ina, MD,FACS;  Location: [facility];  Service: General;  Laterality: N/A;  with mesh to bandport  . Laparoscopic lysis of adhesions  05/22/2012    Procedure: LAPAROSCOPIC LYSIS OF ADHESIONS;  Surgeon: Atilano Ina, MD,FACS;  Location: [facility];  Service: General;  Laterality: N/A;  For 30 minutes  . Mesh applied to lap port  05/22/2012    Procedure: MESH APPLIED TO LAP PORT;  Surgeon: Atilano Ina, MD,FACS;  Location: [facility];  Service: General;;   Social History:  reports that she has never smoked. She has never used smokeless tobacco. She reports that she does not drink alcohol or use illicit drugs.   No Known Allergies  Family History  Problem Relation Age of Onset  . Adopted: Yes    Prior to Admission medications   Medication Sig Start Date End Date Taking? Authorizing Provider  amLODipine (NORVASC) 5 MG tablet Take 1 tablet (5 mg total) by mouth daily. 06/12/12  Yes Atilano Ina, MD  aspirin 81 MG EC tablet Take 81 mg by mouth every morning.    Yes Historical Provider, MD  buPROPion (WELLBUTRIN) 75 MG tablet Take 1 tablet (75 mg total) by mouth 2 (two) times daily. 06/04/12  Yes Atilano Ina, MD  Calcium Carbonate-Vitamin D (CALTRATE 600+D) 600-400 MG-UNIT per tablet Take 1 tablet by mouth every morning.    Yes Historical Provider, MD  Cholecalciferol (VITAMIN D3) 1000 UNITS CAPS Take 1 capsule by mouth daily after lunch.    Yes Historical Provider, MD  clonazePAM (KLONOPIN) 2 MG tablet Take 2 mg by mouth at bedtime.   Yes Historical Provider, MD  dextromethorphan-guaiFENesin (MUCINEX DM) 30-600 MG per 12 hr tablet Take 2 tablets by mouth 2 (two) times daily.   Yes Historical Provider, MD  doxepin (SINEQUAN) 25 MG capsule Take  50 mg by mouth at bedtime.    Yes Historical Provider, MD  DULoxetine (CYMBALTA) 30 MG capsule Take 30 mg by mouth 3 (three) times daily.    Yes Historical Provider, MD  levothyroxine (SYNTHROID, LEVOTHROID) 75 MCG tablet Take 75 mcg by mouth daily before breakfast. Pt gets brand name   Yes Historical Provider, MD  Multiple Vitamin (MULTIVITAMIN) tablet Take 1 tablet by mouth daily.   Yes Historical Provider, MD  NON FORMULARY Take 1 capsule by mouth daily at 3 pm. MEGA RED CAP   Yes Historical Provider, MD  pregabalin (LYRICA) 75 MG capsule Take 75 mg by mouth 2 (two) times daily.   Yes Historical Provider, MD  pyridOXINE (VITAMIN B-6) 100 MG  tablet Take 100 mg by mouth every morning.    Yes Historical Provider, MD  rosuvastatin (CRESTOR) 20 MG tablet Take 20 mg by mouth at bedtime.    Yes Historical Provider, MD  vitamin B-12 (CYANOCOBALAMIN) 1000 MCG tablet Take 1,000 mcg by mouth every evening.    Yes Historical Provider, MD   Physical Exam: Filed Vitals:   04/26/13 1850  BP: 131/82  Pulse: 75  Temp: 98.3 F (36.8 C)  Resp:    BP 131/82  Pulse 75  Temp(Src) 98.3 F (36.8 C) (Oral)  Resp 24  Ht 5\' 7"  (1.702 m)  Wt 95.119 kg (209 lb 11.2 oz)  BMI 32.84 kg/m2  SpO2 95%  General Appearance:    Alert, cooperative, appears mildly short of breath when talking.  Head:    Normocephalic, without obvious abnormality, atraumatic  Eyes:    PERRL, conjunctiva/corneas clear, EOM's intact, fundi    benign, both eyes     Nose:   Nares normal, septum midline, mucosa normal, no drainage    or sinus tenderness  Throat:   Lips, mucosa, and tongue normal; teeth and gums normal  Neck:   Supple, symmetrical, trachea midline, no adenopathy;    thyroid:  no enlargement/tenderness/nodules; no carotid   bruit or JVD  Back:     Symmetric, no curvature, ROM normal, no CVA tenderness  Lungs:     Clear to auscultation bilaterally, respirations unlabored  Chest Wall:    No tenderness or deformity   Heart:    Regular rate and rhythm, S1 and S2 normal, no murmur, rub   or gallop     Abdomen:     Soft, non-tender, bowel sounds active all four quadrants,    no masses, no organomegaly  Genitalia:    deferred  Rectal:    deferred  Extremities:   Extremities normal, atraumatic, no cyanosis or edema  Pulses:   2+ and symmetric all extremities  Skin:   Skin color, texture, turgor normal, no rashes or lesions  Lymph nodes:   Cervical, supraclavicular, and axillary nodes normal  Neurologic:   CNII-XII intact, normal strength, sensation and reflexes    throughout    Psych: normal affect  Labs on Admission:  Basic Metabolic Panel:  Recent  Labs Lab 04/26/13 1256  NA 139  K 4.1  CL 104  CO2 26  GLUCOSE 89  BUN 10  CREATININE 1.06  CALCIUM 9.6   Liver Function Tests: No results found for this basename: AST, ALT, ALKPHOS, BILITOT, PROT, ALBUMIN,  in the last 168 hours No results found for this basename: LIPASE, AMYLASE,  in the last 168 hours No results found for this basename: AMMONIA,  in the last 168 hours CBC:  Recent Labs Lab 04/26/13 1256  WBC 8.0  NEUTROABS 5.1  HGB 13.4  HCT 39.5  MCV 87.8  PLT 244   Cardiac Enzymes:  Recent Labs Lab 04/26/13 1256  TROPONINI <0.30    BNP (last 3 results) No results found for this basename: PROBNP,  in the last 8760 hours CBG: No results found for this basename: GLUCAP,  in the last 168 hours  Radiological Exams on Admission: Dg Chest 2 View  04/26/2013   CLINICAL DATA:  Shortness of breath.  EXAM: CHEST  2 VIEW  COMPARISON:  04/05/2012.  FINDINGS: Mediastinum and hilar structures are unremarkable. Prominent bibasilar atelectasis and infiltrates are present. Underlying pneumonia is most likely present. Tiny right pleural effusion cannot be excluded. No pneumothorax. Postsurgical changes noted of the upper abdomen. Diffuse degenerative changes thoracic spine.  IMPRESSION: Bibasilar atelectasis and pneumonia.   Electronically Signed   By: Maisie Fus  Register   On: 04/26/2013 14:02   Nm Pulmonary Perf And Vent  04/26/2013   CLINICAL DATA:  Shortness of breath for 3 days.  EXAM: NUCLEAR MEDICINE VENTILATION - PERFUSION LUNG SCAN  TECHNIQUE: Ventilation images were obtained in multiple projections using inhaled aerosol technetium 99 M DTPA. Perfusion images were obtained in multiple projections after intravenous injection of Tc-76m MAA.  COMPARISON:  None.  RADIOPHARMACEUTICALS:  40 mCi Tc-4m DTPA aerosol and 6 mCi Tc-18m MAA  FINDINGS: Ventilation: No focal ventilation defect.  Perfusion: No wedge shaped peripheral perfusion defects to suggest acute pulmonary embolism.   IMPRESSION: Negative exam.   Electronically Signed   By: Drusilla Kanner M.D.   On: 04/26/2013 15:54    EKG: Normal sinus rhythm Artifact Left axis deviation Cannot rule out Anterior infarct , age undetermined Nonspecific T wave abnormality  Assessment/Plan    CAP (community acquired pneumonia): HD stable. Continue levaquin.  Lungs clear, but may have bronchospasm, as nebs helped. Will schedule albuterol. Check urine legionella and strep pneumonia antigen.  VQ negative.    HYPOTHYROIDISM   HYPERLIPIDEMIA-MIXED   DEPRESSION   HYPERTENSION, BENIGN ESSENTIAL   Obesity (BMI 30-39.9)   H/O laparoscopic adjustable gastric banding 05/22/2012  Code Status: *full Family Communication: none Disposition Plan: home  Time spent: 60 min  [name] [facility] Pager [phone]  If 7PM-7AM, please contact night-coverage [email or web address] Password [facility] 04/26/2013, 6:52 PM

## 2013-04-27 DIAGNOSIS — I2510 Atherosclerotic heart disease of native coronary artery without angina pectoris: Secondary | ICD-10-CM

## 2013-04-27 DIAGNOSIS — Z9884 Bariatric surgery status: Secondary | ICD-10-CM

## 2013-04-27 DIAGNOSIS — R0609 Other forms of dyspnea: Secondary | ICD-10-CM

## 2013-04-27 LAB — LEGIONELLA ANTIGEN, URINE: Legionella Antigen, Urine: NEGATIVE

## 2013-04-27 MED ORDER — GUAIFENESIN ER 600 MG PO TB12: 1200.0000 mg | ORAL_TABLET | Freq: Two times a day (BID) | ORAL | Status: DC

## 2013-04-27 MED ORDER — GUAIFENESIN ER 600 MG PO TB12
1200.0000 mg | ORAL_TABLET | Freq: Two times a day (BID) | ORAL | Status: DC
  Filled 2013-04-27 (×2): qty 2

## 2013-04-27 MED ORDER — LEVOFLOXACIN 750 MG PO TABS: 750.0000 mg | ORAL_TABLET | Freq: Every day | ORAL | Status: DC

## 2013-04-27 NOTE — Care Management Note (Unsigned)
    Page 1 of 1   04/27/2013     2:10:17 PM   CARE MANAGEMENT NOTE 04/27/2013  Patient:  [name], [name]   Account Number:  [id number]  Date Initiated:  04/27/2013  Documentation initiated by:  Letha Cape  Subjective/Objective Assessment:   dx bil pna  admit- lives with spouse.     Action/Plan:   Anticipated DC Date:  04/29/2013   Anticipated DC Plan:  HOME/SELF CARE      DC Planning Services  CM consult      Choice offered to / List presented to:             Status of service:  In process, will continue to follow Medicare Important Message given?   (If response is "NO", the following Medicare IM given date fields will be blank) Date Medicare IM given:   Date Additional Medicare IM given:    Discharge Disposition:    Per UR Regulation:  Reviewed for med. necessity/level of care/duration of stay  If discussed at Long Length of Stay Meetings, dates discussed:    Comments:  04/27/13 14:09 Letha Cape RN, BSN [phone] patient lives with spouse, NCM will continue to follow for dc needs.

## 2013-04-27 NOTE — Discharge Summary (Signed)
Physician Discharge Summary  [name] ZOX:[id number] DOB: 1946/05/27 DOA: 04/26/2013  PCP: Philemon Kingdom, MD  Admit date: 04/26/2013 Discharge date: 04/27/2013  Time spent: *40* minutes  Recommendations for Outpatient Follow-up:  1. Followup with primary care physician within one week  Discharge Diagnoses:  Principal Problem:   CAP (community acquired pneumonia) Active Problems:   HYPOTHYROIDISM   HYPERLIPIDEMIA-MIXED   DEPRESSION   HYPERTENSION, BENIGN ESSENTIAL   Obesity (BMI 30-39.9)   H/O laparoscopic adjustable gastric banding 05/22/2012   Discharge Condition: Stable  Diet recommendation: Heart healthy diet  Filed Weights   04/26/13 1128 04/26/13 1949  Weight: 95.119 kg (209 lb 11.2 oz) 94.9 kg (209 lb 3.5 oz)    History of present illness:  [name] is a 67 y.o. female presents with several day h/o dysnpea, mild nonproductive cough. Thought she might have a cold. No CP, no F,C, sore throat, rhinorrhea, sick contacts. In ED, normal vitals, CXR showed probable pneumonia and D dimer 0.95. Patient reports the bronchidilator treatment in ED helped. No h/o COPD, asthma or smoking.  Hospital Course:   1. Pneumonia (community-acquired pneumonia): Patient given to the hospital with progressive worsening of shortness of breath, mild nonproductive cough and fatigue. Patient thought initially she does have a common cold, and she was trying to tough this out at home, she went earlier yesterday to see her general surgeon for gastric lap band filling and they noticed that she is short of breath and they sent her to the emergency department for further evaluation. In ED chest x-ray showed bilateral basilar infiltrates, patient also has d-dimer of 0.95. VQ scan was done and showed low probability for PE. Patient started on Levaquin and Mucinex. Symptomatic management with oxygen as needed and antitussives was provided. Is rounded in the patient this morning and  explained to her the extent of her disease, who for that we will discharge her in 24-48 hours if she doing better. Notified by the nursing staff that patient requested to be discharged as her husband developed bad nasal bleeding and he is on anticoagulation and she has to go. Patient will be discharged on Levaquin 750 mg by mouth daily for next 7 days. Mucinex 1200 mg twice a day. Patient to followup with her primary care physician next week.  2. Hypothyroidism: Her Synthroid home dose continue throughout the hospital stay without any changes.  3. Hypertension: Reasonably controlled during this hospital stay home medications continued.  4. Morbid obesity with laparoscopic adjustable gastric band: Patient follows with [facility]. Patient reported that she lost 69 pounds over the past 11 months.  Procedures:  None  Consultations:  None  Discharge Exam: Filed Vitals:   04/27/13 1401  BP: 127/72  Pulse: 64  Temp: 99.2 F (37.3 C)  Resp: 18   General: Alert and awake, oriented x3, not in any acute distress. HEENT: anicteric sclera, pupils reactive to light and accommodation, EOMI CVS: S1-S2 clear, no murmur rubs or gallops Chest: clear to auscultation bilaterally, no wheezing, rales or rhonchi Abdomen: soft nontender, nondistended, normal bowel sounds, no organomegaly Extremities: no cyanosis, clubbing or edema noted bilaterally Neuro: Cranial nerves II-XII intact, no focal neurological deficits  Discharge Instructions  Discharge Orders   Future Appointments Provider Department Dept Phone   05/03/2013 2:45 PM [facility] [place] [phone]   06/01/2013 9:45 AM Orvil Feil Himmelrich, RD [facility] [phone]   Future Orders Complete By Expires  Diet - low sodium heart healthy  As directed    Increase activity slowly  As directed        Medication List         amLODipine 5 MG  tablet  Commonly known as:  NORVASC  Take 1 tablet (5 mg total) by mouth daily.     aspirin 81 MG EC tablet  Take 81 mg by mouth every morning.     buPROPion 75 MG tablet  Commonly known as:  WELLBUTRIN  Take 1 tablet (75 mg total) by mouth 2 (two) times daily.     CALTRATE 600+D 600-400 MG-UNIT per tablet  Generic drug:  Calcium Carbonate-Vitamin D  Take 1 tablet by mouth every morning.     clonazePAM 2 MG tablet  Commonly known as:  KLONOPIN  Take 2 mg by mouth at bedtime.     dextromethorphan-guaiFENesin 30-600 MG per 12 hr tablet  Commonly known as:  MUCINEX DM  Take 2 tablets by mouth 2 (two) times daily.     doxepin 25 MG capsule  Commonly known as:  SINEQUAN  Take 50 mg by mouth at bedtime.     DULoxetine 30 MG capsule  Commonly known as:  CYMBALTA  Take 30 mg by mouth 3 (three) times daily.     guaiFENesin 600 MG 12 hr tablet  Commonly known as:  MUCINEX  Take 2 tablets (1,200 mg total) by mouth 2 (two) times daily.     levofloxacin 750 MG tablet  Commonly known as:  LEVAQUIN  Take 1 tablet (750 mg total) by mouth daily.     levothyroxine 75 MCG tablet  Commonly known as:  SYNTHROID, LEVOTHROID  Take 75 mcg by mouth daily before breakfast. Pt gets brand name     multivitamin tablet  Take 1 tablet by mouth daily.     NON FORMULARY  Take 1 capsule by mouth daily at 3 pm. MEGA RED CAP     pregabalin 75 MG capsule  Commonly known as:  LYRICA  Take 75 mg by mouth 2 (two) times daily.     pyridOXINE 100 MG tablet  Commonly known as:  VITAMIN B-6  Take 100 mg by mouth every morning.     rosuvastatin 20 MG tablet  Commonly known as:  CRESTOR  Take 20 mg by mouth at bedtime.     vitamin B-12 1000 MCG tablet  Commonly known as:  CYANOCOBALAMIN  Take 1,000 mcg by mouth every evening.     Vitamin D3 1000 UNITS Caps  Take 1 capsule by mouth daily after lunch.       No Known Allergies     Follow-up Information   Follow up with PROCHNAU,CAROLINE, MD.    Specialty:  Internal Medicine   Contact information:   [address] [place] [address] [phone]        The results of significant diagnostics from this hospitalization (including imaging, microbiology, ancillary and laboratory) are listed below for reference.    Significant Diagnostic Studies: Dg Chest 2 View  04/26/2013   CLINICAL DATA:  Shortness of breath.  EXAM: CHEST  2 VIEW  COMPARISON:  04/05/2012.  FINDINGS: Mediastinum and hilar structures are unremarkable. Prominent bibasilar atelectasis and infiltrates are present. Underlying pneumonia is most likely present. Tiny right pleural effusion cannot be excluded. No pneumothorax. Postsurgical changes noted of the upper abdomen. Diffuse degenerative changes thoracic spine.  IMPRESSION: Bibasilar atelectasis and pneumonia.   Electronically Signed   By: Maisie Fus  Register   On: 04/26/2013 14:02   Nm Pulmonary Perf And Vent  04/26/2013   CLINICAL DATA:  Shortness of breath for 3 days.  EXAM: NUCLEAR MEDICINE VENTILATION - PERFUSION LUNG SCAN  TECHNIQUE: Ventilation images were obtained in multiple projections using inhaled aerosol technetium 99 M DTPA. Perfusion images were obtained in multiple projections after intravenous injection of Tc-20m MAA.  COMPARISON:  None.  RADIOPHARMACEUTICALS:  40 mCi Tc-24m DTPA aerosol and 6 mCi Tc-23m MAA  FINDINGS: Ventilation: No focal ventilation defect.  Perfusion: No wedge shaped peripheral perfusion defects to suggest acute pulmonary embolism.  IMPRESSION: Negative exam.   Electronically Signed   By: Drusilla Kanner M.D.   On: 04/26/2013 15:54    Microbiology: No results found for this or any previous visit (from the past 240 hour(s)).   Labs: Basic Metabolic Panel:  Recent Labs Lab 04/26/13 1256 04/26/13 2014  NA 139  --   K 4.1  --   CL 104  --   CO2 26  --   GLUCOSE 89  --   BUN 10  --   CREATININE 1.06 1.02  CALCIUM 9.6  --    Liver Function Tests: No results found for this  basename: AST, ALT, ALKPHOS, BILITOT, PROT, ALBUMIN,  in the last 168 hours No results found for this basename: LIPASE, AMYLASE,  in the last 168 hours No results found for this basename: AMMONIA,  in the last 168 hours CBC:  Recent Labs Lab 04/26/13 1256 04/26/13 2014  WBC 8.0 6.4  NEUTROABS 5.1  --   HGB 13.4 13.5  HCT 39.5 39.8  MCV 87.8 87.5  PLT 244 258   Cardiac Enzymes:  Recent Labs Lab 04/26/13 1256  TROPONINI <0.30   BNP: BNP (last 3 results)  Recent Labs  04/26/13 1256  PROBNP 306.9*   CBG: No results found for this basename: GLUCAP,  in the last 168 hours     Signed:  Margarette Vannatter A  [facility] 04/27/2013, 5:06 PM

## 2013-04-27 NOTE — Progress Notes (Signed)
Nsg Discharge Note  Admit Date:  04/26/2013 Discharge date: 04/27/2013   [name] to be D/C'd Home per MD order.  AVS completed.  Copy for chart, and copy for patient signed, and dated. Patient/caregiver able to verbalize understanding.  Discharge Medication:   Medication List         amLODipine 5 MG tablet  Commonly known as:  NORVASC  Take 1 tablet (5 mg total) by mouth daily.     aspirin 81 MG EC tablet  Take 81 mg by mouth every morning.     buPROPion 75 MG tablet  Commonly known as:  WELLBUTRIN  Take 1 tablet (75 mg total) by mouth 2 (two) times daily.     CALTRATE 600+D 600-400 MG-UNIT per tablet  Generic drug:  Calcium Carbonate-Vitamin D  Take 1 tablet by mouth every morning.     clonazePAM 2 MG tablet  Commonly known as:  KLONOPIN  Take 2 mg by mouth at bedtime.     dextromethorphan-guaiFENesin 30-600 MG per 12 hr tablet  Commonly known as:  MUCINEX DM  Take 2 tablets by mouth 2 (two) times daily.     doxepin 25 MG capsule  Commonly known as:  SINEQUAN  Take 50 mg by mouth at bedtime.     DULoxetine 30 MG capsule  Commonly known as:  CYMBALTA  Take 30 mg by mouth 3 (three) times daily.     guaiFENesin 600 MG 12 hr tablet  Commonly known as:  MUCINEX  Take 2 tablets (1,200 mg total) by mouth 2 (two) times daily.     levofloxacin 750 MG tablet  Commonly known as:  LEVAQUIN  Take 1 tablet (750 mg total) by mouth daily.     levothyroxine 75 MCG tablet  Commonly known as:  SYNTHROID, LEVOTHROID  Take 75 mcg by mouth daily before breakfast. Pt gets brand name     multivitamin tablet  Take 1 tablet by mouth daily.     NON FORMULARY  Take 1 capsule by mouth daily at 3 pm. MEGA RED CAP     pregabalin 75 MG capsule  Commonly known as:  LYRICA  Take 75 mg by mouth 2 (two) times daily.     pyridOXINE 100 MG tablet  Commonly known as:  VITAMIN B-6  Take 100 mg by mouth every morning.     rosuvastatin 20 MG tablet  Commonly known as:  CRESTOR   Take 20 mg by mouth at bedtime.     vitamin B-12 1000 MCG tablet  Commonly known as:  CYANOCOBALAMIN  Take 1,000 mcg by mouth every evening.     Vitamin D3 1000 UNITS Caps  Take 1 capsule by mouth daily after lunch.        Discharge Assessment: Filed Vitals:   04/27/13 1401  BP: 127/72  Pulse: 64  Temp: 99.2 F (37.3 C)  Resp: 18   Skin clean, dry and intact without evidence of skin break down, no evidence of skin tears noted. IV catheter discontinued intact. Site without signs and symptoms of complications - no redness or edema noted at insertion site, patient denies c/o pain - only slight tenderness at site.  Dressing with slight pressure applied.  D/c Instructions-Education: Discharge instructions given to patient/family with verbalized understanding. D/c education completed with patient/family including follow up instructions, medication list, d/c activities limitations if indicated, with other d/c instructions as indicated by MD - patient able to verbalize understanding, all questions fully answered. Patient instructed to return  to ED, call 911, or call MD for any changes in condition.  Patient will be escorted via WC, and D/C home via private auto.  Kern Reap, RN 04/27/2013 6:59 PM

## 2013-04-27 NOTE — Progress Notes (Signed)
[facility] PROGRESS NOTE  [name] ZOX:[id number] DOB: 01-21-1946 DOA: 04/26/2013 PCP: Philemon Kingdom, MD  HPI/Subjective: This is complaining of cough and sputum production. Some chest tightness with deep breath  Assessment/Plan: Principal Problem:   CAP (community acquired pneumonia) Active Problems:   HYPOTHYROIDISM   HYPERLIPIDEMIA-MIXED   DEPRESSION   HYPERTENSION, BENIGN ESSENTIAL   Obesity (BMI 30-39.9)   H/O laparoscopic adjustable gastric banding 05/22/2012   Pneumonia -Community acquired pneumonia, chest x-ray showed bilateral bibasilar infiltrates. -Patient started on Rocephin and azithromycin. -Supportive management with mucolytics, antitussives and oxygen as needed.  Hypothyroidism -Continue home dose of Synthroid, check TSH.  Hypertension -Result controlled, continue home medications.  Morbid obesity with laparoscopic adjustable gastric band -She follows with [facility], patient said she lost about 69 pounds in the past 11 months.  Code Status: Full code Family Communication: Plan discussed with the patient. Disposition Plan: Remains inpatient   Consultants:  None  Procedures:  None  Antibiotics: Levaquin.    Objective: Filed Vitals:   04/27/13 0522  BP: 132/73  Pulse: 63  Temp: 98.8 F (37.1 C)  Resp: 18    Intake/Output Summary (Last 24 hours) at 04/27/13 1057 Last data filed at 04/27/13 0900  Gross per 24 hour  Intake    480 ml  Output      0 ml  Net    480 ml   Filed Weights   04/26/13 1128 04/26/13 1949  Weight: 95.119 kg (209 lb 11.2 oz) 94.9 kg (209 lb 3.5 oz)    Exam: General: Alert and awake, oriented x3, not in any acute distress. HEENT: anicteric sclera, pupils reactive to light and accommodation, EOMI CVS: S1-S2 clear, no murmur rubs or gallops Chest: clear to auscultation bilaterally, no wheezing, rales or rhonchi Abdomen: soft nontender, nondistended, normal bowel sounds, no  organomegaly Extremities: no cyanosis, clubbing or edema noted bilaterally Neuro: Cranial nerves II-XII intact, no focal neurological deficits  Data Reviewed: Basic Metabolic Panel:  Recent Labs Lab 04/26/13 1256 04/26/13 2014  NA 139  --   K 4.1  --   CL 104  --   CO2 26  --   GLUCOSE 89  --   BUN 10  --   CREATININE 1.06 1.02  CALCIUM 9.6  --    Liver Function Tests: No results found for this basename: AST, ALT, ALKPHOS, BILITOT, PROT, ALBUMIN,  in the last 168 hours No results found for this basename: LIPASE, AMYLASE,  in the last 168 hours No results found for this basename: AMMONIA,  in the last 168 hours CBC:  Recent Labs Lab 04/26/13 1256 04/26/13 2014  WBC 8.0 6.4  NEUTROABS 5.1  --   HGB 13.4 13.5  HCT 39.5 39.8  MCV 87.8 87.5  PLT 244 258   Cardiac Enzymes:  Recent Labs Lab 04/26/13 1256  TROPONINI <0.30   BNP (last 3 results)  Recent Labs  04/26/13 1256  PROBNP 306.9*   CBG: No results found for this basename: GLUCAP,  in the last 168 hours  Micro No results found for this or any previous visit (from the past 240 hour(s)).   Studies: Dg Chest 2 View  04/26/2013   CLINICAL DATA:  Shortness of breath.  EXAM: CHEST  2 VIEW  COMPARISON:  04/05/2012.  FINDINGS: Mediastinum and hilar structures are unremarkable. Prominent bibasilar atelectasis and infiltrates are present. Underlying pneumonia is most likely present. Tiny right pleural effusion cannot be excluded. No pneumothorax. Postsurgical changes noted of the upper  abdomen. Diffuse degenerative changes thoracic spine.  IMPRESSION: Bibasilar atelectasis and pneumonia.   Electronically Signed   By: Maisie Fus  Register   On: 04/26/2013 14:02   Nm Pulmonary Perf And Vent  04/26/2013   CLINICAL DATA:  Shortness of breath for 3 days.  EXAM: NUCLEAR MEDICINE VENTILATION - PERFUSION LUNG SCAN  TECHNIQUE: Ventilation images were obtained in multiple projections using inhaled aerosol technetium 99 M DTPA.  Perfusion images were obtained in multiple projections after intravenous injection of Tc-71m MAA.  COMPARISON:  None.  RADIOPHARMACEUTICALS:  40 mCi Tc-79m DTPA aerosol and 6 mCi Tc-65m MAA  FINDINGS: Ventilation: No focal ventilation defect.  Perfusion: No wedge shaped peripheral perfusion defects to suggest acute pulmonary embolism.  IMPRESSION: Negative exam.   Electronically Signed   By: Drusilla Kanner M.D.   On: 04/26/2013 15:54    Scheduled Meds: . amLODipine  5 mg Oral Daily  . aspirin EC  81 mg Oral q morning - 10a  . atorvastatin  20 mg Oral q1800  . buPROPion  75 mg Oral BID  . calcium-vitamin D  1 tablet Oral q morning - 10a  . clonazePAM  2 mg Oral QHS  . dextromethorphan-guaiFENesin  2 tablet Oral BID  . doxepin  50 mg Oral QHS  . DULoxetine  30 mg Oral TID  . enoxaparin (LOVENOX) injection  40 mg Subcutaneous Q24H  . levofloxacin  750 mg Oral Q24H  . levothyroxine  75 mcg Oral QAC breakfast  . pregabalin  75 mg Oral BID  . pyridOXINE  100 mg Oral q morning - 10a  . vitamin B-12  1,000 mcg Oral QPM   Continuous Infusions:      Time spent: 35 minutes    [facility] A  [facility] Pager [phone] If 7PM-7AM, please contact night-coverage at [email or web address], password [facility] 04/27/2013, 10:57 AM  LOS: 1 day

## 2013-05-03 ENCOUNTER — Encounter (INDEPENDENT_AMBULATORY_CARE_PROVIDER_SITE_OTHER): Payer: Self-pay

## 2013-05-03 ENCOUNTER — Encounter (INDEPENDENT_AMBULATORY_CARE_PROVIDER_SITE_OTHER): Payer: Medicare Other

## 2013-05-03 ENCOUNTER — Ambulatory Visit (INDEPENDENT_AMBULATORY_CARE_PROVIDER_SITE_OTHER): Payer: Medicare Other | Admitting: Physician Assistant

## 2013-05-03 VITALS — BP 118/68 | HR 72 | Resp 16 | Ht 67.0 in | Wt 210.2 lb

## 2013-05-03 DIAGNOSIS — Z4651 Encounter for fitting and adjustment of gastric lap band: Secondary | ICD-10-CM

## 2013-05-03 NOTE — Patient Instructions (Signed)

## 2013-05-03 NOTE — Progress Notes (Signed)
  HISTORY: [name] is a 67 y.o.female who received an AP-Standard lap-band in December 2013 by Dr. Andrey Campanile. She comes in with stable weight since her last visit. She was seen last week and sent to the ED for shortness of breath. Her condition warranted admission with IV antibiotics. Fortunately she's improved dramatically since her discharge. She does complain of hunger and larger portion sizes but no obstructive symptoms.  VITAL SIGNS: Filed Vitals:   05/03/13 1414  BP: 118/68  Pulse: 72  Resp: 16    PHYSICAL EXAM: Physical exam reveals a very well-appearing 67 y.o.female in no apparent distress Neurologic: Awake, alert, oriented Psych: Bright affect, conversant Respiratory: Breathing even and unlabored. No stridor or wheezing Abdomen: Soft, nontender, nondistended to palpation. Incisions well-healed. No incisional hernias. Port easily palpated. Extremities: Atraumatic, good range of motion.  ASSESMENT: 67 y.o.  female  s/p AP-Standard lap-band.   PLAN: The patient's port was accessed with a 20G Huber needle without difficulty. Clear fluid was aspirated and 0.5 mL saline was added to the port to give a total predicted volume of 4.0 mL. The patient was able to swallow water without difficulty following the procedure and was instructed to take clear liquids for the next 24-48 hours and advance slowly as tolerated.

## 2013-05-14 ENCOUNTER — Encounter (INDEPENDENT_AMBULATORY_CARE_PROVIDER_SITE_OTHER): Payer: Medicare Other | Admitting: Surgery

## 2013-05-17 ENCOUNTER — Encounter (INDEPENDENT_AMBULATORY_CARE_PROVIDER_SITE_OTHER): Payer: Medicare Other

## 2013-05-31 ENCOUNTER — Ambulatory Visit (INDEPENDENT_AMBULATORY_CARE_PROVIDER_SITE_OTHER): Payer: Medicare Other | Admitting: Physician Assistant

## 2013-05-31 ENCOUNTER — Encounter (INDEPENDENT_AMBULATORY_CARE_PROVIDER_SITE_OTHER): Payer: Self-pay

## 2013-05-31 VITALS — BP 120/80 | HR 80 | Temp 98.6°F | Resp 14 | Ht 67.0 in | Wt 202.8 lb

## 2013-05-31 DIAGNOSIS — Z4651 Encounter for fitting and adjustment of gastric lap band: Secondary | ICD-10-CM

## 2013-05-31 NOTE — Patient Instructions (Signed)
Return in two months. Focus on good food choices as well as physical activity. Return sooner if you have an increase in hunger, portion sizes or weight. Return also for difficulty swallowing, night cough, reflux.   

## 2013-05-31 NOTE — Progress Notes (Signed)
  HISTORY: [name] is a 67 y.o.female who received an AP-Standard lap-band in December 2013 by Dr. Andrey Campanile. She has lost about 8 lbs since last visit but unfortunately she's not been able to tolerate solid foods. She has no issues with liquids at all. She did not have any such issues prior to her last fill of 0.5 mL.  VITAL SIGNS: Filed Vitals:   05/31/13 1442  BP: 120/80  Pulse: 80  Temp: 98.6 F (37 C)  Resp: 14    PHYSICAL EXAM: Physical exam reveals a very well-appearing 67 y.o.female in no apparent distress Neurologic: Awake, alert, oriented Psych: Bright affect, conversant Respiratory: Breathing even and unlabored. No stridor or wheezing Abdomen: Soft, nontender, nondistended to palpation. Incisions well-healed. No incisional hernias. Port easily palpated. Extremities: Atraumatic, good range of motion.  ASSESMENT: 67 y.o.  female  s/p AP-Standard lap-band.   PLAN: The patient's port was accessed with a 20G Huber needle without difficulty. Clear fluid was aspirated and 0.25 mL saline was removed from the port to give a total predicted volume of 4.65 mL. She was able to drink water easily and noticed that it went down faster than before the fluid removal. The patient was advised to concentrate on healthy food choices and to avoid slider foods high in fats and carbohydrates.

## 2013-06-01 ENCOUNTER — Encounter (INDEPENDENT_AMBULATORY_CARE_PROVIDER_SITE_OTHER): Payer: Self-pay | Admitting: General Surgery

## 2013-06-01 ENCOUNTER — Ambulatory Visit: Payer: Medicare Other | Admitting: *Deleted

## 2013-06-01 ENCOUNTER — Ambulatory Visit (INDEPENDENT_AMBULATORY_CARE_PROVIDER_SITE_OTHER): Payer: Self-pay | Admitting: General Surgery

## 2013-06-01 VITALS — BP 130/80 | HR 64 | Temp 98.4°F | Resp 14 | Ht 67.0 in | Wt 203.0 lb

## 2013-06-01 DIAGNOSIS — E669 Obesity, unspecified: Secondary | ICD-10-CM

## 2013-06-01 DIAGNOSIS — R1310 Dysphagia, unspecified: Secondary | ICD-10-CM

## 2013-06-01 NOTE — Patient Instructions (Signed)
1. Stay on liquids for the next 2 days as you adapt to your new fill volume.  Then resume your previous diet. 2. Decreasing your carbohydrate intake will hasten you weight loss.  Rely more on proteins for your meals.  Avoid condiments that contain sweets such as Honey Mustard and sugary salad dressings.   3. Stay in the "green zone".  If you are regurgitating with meals, having night time reflux, and find yourself eating soft comfort foods (mashed potatoes, potato chips)...realize that you are developing "maladaptive eating".  You will not lose weight this way and may regain weight.  The GREEN ZONE is eating smaller portions and not regurgitating.  Hence we may need to withdraw fluid from your band. 4. Build exercise into your daily routine.  Walking is the best way to start but do something every day if you can.    Eating techniques 20-20-20 (30-30-30) 20 chews, 20 seconds between bites of food, 20 minutes to eat; sometimes you may need 30 chews, 30 seconds etc Use your nondominant hand to eat with Use a child/infant size utensil Put fork down between bites of food Try not to eat while watching TV

## 2013-06-02 NOTE — Progress Notes (Signed)
Subjective:     Patient ID: [name], female   DOB: Aug 06, 1945, 68 y.o.   MRN: [id number]  HPI 67 year old Caucasian female status post laparoscopic adjustable gastric band procedure in December 2013 comes in after being seen in our lab band clinic yesterday for PO intolerance. She had a little bit of fluid removed yesterday. She had a fill about a month ago and had 0.5 cc Added to her band at that time. Since then she has had issues. Yesterday she had 0.25 cc  removed in hopes that it would ameliorate her symptoms. She states that she is still having burning in her upper chest at night. She is still having difficulty with solids. Prior to that adjustment November she was tolerating food fine. She did get admitted to the hospital in early November with pneumonia. A chest x-ray was obtained. She denies any nighttime cough. She denies any fever or chills. She denies any abdominal pain.  Review of Systems     Objective:   Physical Exam BP 130/80  Pulse 64  Temp(Src) 98.4 F (36.9 C) (Temporal)  Resp 14  Ht 5\' 7"  (1.702 m)  Wt 203 lb (92.08 kg)  BMI 31.79 kg/m2  See LapBand flowsheet  Gen: alert, NAD, non-toxic appearing HEENT: normocephalic, atraumatic; pupils equal, no scleral icterus,  Pulm:  symmetric chest rise CV: regular rate and rhythm Abd: soft, nontender, nondistended. Well-healed trocar sites. No incisional hernia. Port is in right mid-abdomen Psych: appropriate, judgment normal     Assessment:     Status post laparoscopic adjustable gastric band placement Reflux By mouth intolerance     Plan:     Because of her ongoing symptoms I recommended an adjustment today.  After obtaining verbal consent, the abdominal wall was prepped with Chloraprep. The port was accessed with a Huber needle and 0.9 cc of saline was removed to give the patient an expected fill volume of 3.7 cc.  The patient was able to tolerate sips of water. She states that she could tell an immediate  improvement  She was instructed to stay on liquids for 24 hours then soft foods. We rediscussed proper eating behaviors and techniques. I did look at her chest x-ray from her admission back in November. It was more focused on her chest with respect to exposure but it did include her upper abdomen. By changing the contrast of the film I could evaluate her band position and it appeared to be in good position.  Followup 6-8 weeks as scheduled. She was advised to call with ongoing issues. If she has additional issues that I think she'll need a band vacation as well as an upper GI  [name]. Andrey Campanile, MD, FACS General, Bariatric, & Minimally Invasive Surgery [facility], [place]

## 2013-06-27 ENCOUNTER — Telehealth (INDEPENDENT_AMBULATORY_CARE_PROVIDER_SITE_OTHER): Payer: Self-pay | Admitting: General Surgery

## 2013-06-27 NOTE — Telephone Encounter (Signed)
Called patient home and spoke to the husband and told him to tell his wife that she needs to be here to see Dr Andrey CampanileWilson at 11:00 am and not 11:30 am. Per Dr Andrey CampanileWilson. The husband will pass on the message

## 2013-06-28 ENCOUNTER — Encounter (INDEPENDENT_AMBULATORY_CARE_PROVIDER_SITE_OTHER): Payer: Self-pay

## 2013-06-28 ENCOUNTER — Ambulatory Visit (INDEPENDENT_AMBULATORY_CARE_PROVIDER_SITE_OTHER): Payer: Medicare Other | Admitting: Physician Assistant

## 2013-06-28 VITALS — BP 118/70 | HR 62 | Temp 97.1°F | Resp 14 | Ht 67.0 in | Wt 213.3 lb

## 2013-06-28 DIAGNOSIS — Z4651 Encounter for fitting and adjustment of gastric lap band: Secondary | ICD-10-CM

## 2013-06-28 NOTE — Progress Notes (Signed)
  HISTORY: [name] is a 68 y.o.female who received an AP-Standard lap-band in December 2013 by Dr. Andrey CampanileWilson. She comes in with 10 lbs weight gain since her last visit with Dr. Andrey CampanileWilson in mid-December. I had removed 0.25 mL for reflux/regurgitation but she didn't get relief. Dr. Andrey CampanileWilson subsequently removed 0.9 mL. She has no further untoward symptoms but is upset with her weight gain. She voiced frustration with this, saying she'd rather be obstructed than gain weight.  VITAL SIGNS: Filed Vitals:   06/28/13 1417  BP: 118/70  Pulse: 62  Temp: 97.1 F (36.2 C)  Resp: 14    PHYSICAL EXAM: Physical exam reveals a very well-appearing 68 y.o.female in no apparent distress Neurologic: Awake, alert, oriented Psych: Bright affect, conversant Respiratory: Breathing even and unlabored. No stridor or wheezing Abdomen: Soft, nontender, nondistended to palpation. Incisions well-healed. No incisional hernias. Port easily palpated. Extremities: Atraumatic, good range of motion.  ASSESMENT: 68 y.o.  female  s/p AP-Standard lap-band.   PLAN: The patient's port was accessed with a 20G Huber needle without difficulty. Clear fluid was aspirated and 0.6 mL saline was added to the port to give a total predicted volume of 4.3 mL. The patient was able to swallow water without difficulty following the procedure and was instructed to take clear liquids for the next 24-48 hours and advance slowly as tolerated. We discussed the importance of avoiding over-restriction for the multiple issues it can cause (namely pneumonia, slip, etc). She voiced understanding. She is to see Dr. Andrey CampanileWilson next week for discussion of sleeve gastrectomy.

## 2013-06-28 NOTE — Patient Instructions (Signed)

## 2013-07-05 ENCOUNTER — Encounter (INDEPENDENT_AMBULATORY_CARE_PROVIDER_SITE_OTHER): Payer: Self-pay

## 2013-07-05 ENCOUNTER — Encounter (INDEPENDENT_AMBULATORY_CARE_PROVIDER_SITE_OTHER): Payer: Self-pay | Admitting: General Surgery

## 2013-07-05 ENCOUNTER — Telehealth (INDEPENDENT_AMBULATORY_CARE_PROVIDER_SITE_OTHER): Payer: Self-pay | Admitting: *Deleted

## 2013-07-05 ENCOUNTER — Ambulatory Visit (INDEPENDENT_AMBULATORY_CARE_PROVIDER_SITE_OTHER): Payer: Medicare Other | Admitting: General Surgery

## 2013-07-05 VITALS — BP 110/68 | HR 80 | Temp 98.4°F | Resp 14 | Ht 67.0 in | Wt 214.0 lb

## 2013-07-05 DIAGNOSIS — Z9884 Bariatric surgery status: Secondary | ICD-10-CM

## 2013-07-05 DIAGNOSIS — E669 Obesity, unspecified: Secondary | ICD-10-CM

## 2013-07-05 NOTE — Progress Notes (Signed)
Patient ID: [name], female   DOB: 1945/10/03, 68 y.o.   MRN: [id number]  Chief Complaint  Patient presents with  . Lap Band Fill    HPI [name] is a 69 y.o. female.   HPI 68 year old Caucasian female status post laparoscopic adjustable gastric band placement with an AP standard band on 05/22/2012 comes in for ongoing follow her regarding her lap band. It is been ongoing challenge getting her & keeping her in the green zone. She has had multiple office visits over the past several months needing minor adjustments of her LapBand. She will get too tight and had to have fluid removed. We will then slowly add minute amount of fluid back & run into the same situation. We have been unable to find the optimal volume status where she is not hungry between meals and able to eat large portions of food. She is still exercising regularly. She is using correct eating behaviors such as taking a small bite, chewing 20-30 times, and waiting 30 seconds between bites of food. She is also making appropriate food choices. She had a minor adjustment last week but still reports ongoing increased hunger between meals and able eat larger portions. She denies any heartburn or reflux. She denies any regurgitation. She has not lost any additional weight. She did have a hospitalization in November for pneumonia. Because the gym membership is too expensive she has set up a home gym in her spare bedroom. She is interested in having her LapBand converted to a sleeve gastrectomy Past Medical History  Diagnosis Date  . Palpitations     history  . Depression   . Hyperlipidemia   . Hypertension   . Hypothyroidism   . Obesity (BMI 30-39.9)   . CAD (coronary artery disease)   . Chronic kidney disease     normal kidney function; left kidney removed 2012  . Arthritis   . Fibromyalgia   . PONV (postoperative nausea and vomiting)     nausea only    Past Surgical History  Procedure Laterality Date  .  Appendectomy    . Cholecystectomy    . Tonsillectomy    . Total knee arthroplasty  2003 (R); 2010 (L); 2012 (L clean-out)    Bilateral  . Laparoscopic incisional / umbilical / ventral hernia repair  2004    upper midline hernia, 15 x 19cm dual Goretex mesh  . 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 Total abdominal hysterectomy  1978  . Laparoscopic gastric banding  05/22/2012    Procedure: LAPAROSCOPIC GASTRIC BANDING;  Surgeon: Atilano Ina, MD,FACS;  Location: [facility];  Service: General;  Laterality: N/A;  with mesh to bandport  . Laparoscopic lysis of adhesions  05/22/2012    Procedure: LAPAROSCOPIC LYSIS OF ADHESIONS;  Surgeon: Atilano Ina, MD,FACS;  Location: [facility];  Service: General;  Laterality: N/A;  For 30 minutes  . Mesh applied to lap port  05/22/2012    Procedure: MESH APPLIED TO LAP PORT;  Surgeon: Atilano Ina, MD,FACS;  Location: [facility];  Service: General;;    Family History  Problem Relation Age of Onset  . Adopted: Yes    Social History History  Substance Use Topics  . Smoking status: Never Smoker   . Smokeless tobacco: Never Used  . Alcohol Use: Yes     Comment: once a week 1 wine cooler    No Known Allergies  Current Outpatient Prescriptions  Medication Sig Dispense  Refill  . amLODipine (NORVASC) 5 MG tablet Take 1 tablet (5 mg total) by mouth daily.  30 tablet  1  . aspirin 81 MG EC tablet Take 81 mg by mouth every morning.       Marland Kitchen buPROPion (WELLBUTRIN) 75 MG tablet Take 1 tablet (75 mg total) by mouth 2 (two) times daily.  60 tablet  1  . Calcium Carbonate-Vitamin D (CALTRATE 600+D) 600-400 MG-UNIT per tablet Take 1 tablet by mouth every morning.       . Cholecalciferol (VITAMIN D3) 1000 UNITS CAPS Take 1 capsule by mouth daily after lunch.       . clonazePAM (KLONOPIN) 2 MG tablet Take 2 mg by mouth at bedtime.      Marland Kitchen dextromethorphan-guaiFENesin (MUCINEX DM) 30-600 MG per 12 hr tablet Take 2 tablets by mouth 2 (two) times daily.       Marland Kitchen doxepin (SINEQUAN) 25 MG capsule Take 50 mg by mouth at bedtime.       . DULoxetine (CYMBALTA) 30 MG capsule Take 30 mg by mouth 3 (three) times daily.       Marland Kitchen guaiFENesin (MUCINEX) 600 MG 12 hr tablet Take 2 tablets (1,200 mg total) by mouth 2 (two) times daily.  30 tablet  0  . levofloxacin (LEVAQUIN) 750 MG tablet Take 1 tablet (750 mg total) by mouth daily.  7 tablet  0  . levothyroxine (SYNTHROID, LEVOTHROID) 75 MCG tablet Take 75 mcg by mouth daily before breakfast. Pt gets brand name      . Multiple Vitamin (MULTIVITAMIN) tablet Take 1 tablet by mouth daily.      . NON FORMULARY Take 1 capsule by mouth daily at 3 pm. MEGA RED CAP      . pregabalin (LYRICA) 75 MG capsule Take 75 mg by mouth 2 (two) times daily.      Marland Kitchen pyridOXINE (VITAMIN B-6) 100 MG tablet Take 100 mg by mouth every morning.       . rosuvastatin (CRESTOR) 20 MG tablet Take 20 mg by mouth at bedtime.       . vitamin B-12 (CYANOCOBALAMIN) 1000 MCG tablet Take 1,000 mcg by mouth every evening.        No current facility-administered medications for this visit.    Review of Systems Review of Systems  Constitutional: Negative for fever, activity change, appetite change and unexpected weight change.  HENT: Negative for nosebleeds and trouble swallowing.   Eyes: Negative for photophobia and visual disturbance.  Respiratory: Negative for chest tightness and shortness of breath.   Cardiovascular: Negative for chest pain and leg swelling.       Denies CP, SOB, orthopnea, PND, DOE  Genitourinary: Negative for dysuria and difficulty urinating.  Musculoskeletal: Negative for arthralgias.  Skin: Negative for pallor and rash.  Neurological: Negative for dizziness, seizures, facial asymmetry and numbness.       Denies TIA and amaurosis fugax   Hematological: Negative for adenopathy. Does not bruise/bleed easily.  Psychiatric/Behavioral: Negative for behavioral problems and agitation.    Blood pressure 110/68, pulse 80,  temperature 98.4 F (36.9 C), temperature source Temporal, resp. rate 14, height 5\' 7"  (1.702 m), weight 214 lb (97.07 kg).  Physical Exam Physical Exam  Vitals reviewed. Constitutional: She is oriented to person, place, and time. She appears well-developed and well-nourished. No distress.  obese  HENT:  Head: Normocephalic and atraumatic.  Right Ear: External ear normal.  Left Ear: External ear normal.  Eyes: Conjunctivae are normal. No scleral icterus.  Neck: Normal range of motion. Neck supple. No tracheal deviation present. No thyromegaly present.  Cardiovascular: Normal rate and normal heart sounds.   Pulmonary/Chest: Effort normal and breath sounds normal. No stridor. No respiratory distress. She has no wheezes.  Abdominal: Soft. She exhibits no distension. There is no tenderness. There is no rebound.  Well-healed trocar incisions. Port in right midabdomen.  Musculoskeletal: She exhibits no edema and no tenderness.  Lymphadenopathy:    She has no cervical adenopathy.  Neurological: She is alert and oriented to person, place, and time. She exhibits normal muscle tone.  Skin: Skin is warm and dry. No rash noted. She is not diaphoretic. No erythema.  Some redundant skin bilateral upper extremities and around hips  Psychiatric: She has a normal mood and affect. Her behavior is normal. Judgment and thought content normal.    Data Reviewed Dr Donita BrooksProchnau's note 06/12/13 Dr Ludwig Clarksrenshaw's note Multiple lapband clinic notes including mine LapBand Flowsheet Operative note  Assessment    Obesity-BMI 33.5 Hypertension Chronic kidney disease Coronary disease Hyperlipidemia Hypothyroidism Degenerative disc disease     Plan    I spent more than 40 minutes in consultation with the patient. After speaking with her I do believe she is utilizing the correct eating techniques and behaviors and making proper food choices therefore I do not think it is unreasonable to consider her for  conversion from a lap band to sleeve gastrectomy. We have had ongoing issues and struggles with trying to find the optimal volume of her lap band in order to get her into the green zoneAs a result she has not reached her expected weight loss.  My first recommendation was to start with an upper GI to evaluate her anatomy to make sure there has been no slip or pouch dilation.   After obtaining verbal consent, the abdominal wall was prepped with Chloraprep. The port was accessed with a Huber needle and 0.25 cc of saline was added to give the patient an expected fill volume of 4.55 cc.  The patient was able to tolerate sips of water.  She was instructed to stay on a full liquid diet for the next 2 days.  We then discussed laparoscopic sleeve gastrectomy. We discussed the preoperative, operative and postoperative process. Using diagrams, I explained the surgery in detail including the performance of an EGD near the end of the surgery and an Upper GI swallow study on POD 1. We discussed the typical hospital course including a 2-3 day stay baring any complications.   The patient was given educational material. I quoted the patient that most patients can lose up to 50-70% of their excess weight. We did discuss the possibility of weight regain several years after the procedure.  The risks of infection, bleeding, pain, scarring, weight regain, too little or too much weight loss, vitamin deficiencies and need for lifelong vitamin supplementation, hair loss, need for protein supplementation, leaks, stricture, reflux, food intolerance, gallstone formation, hernia, need for reoperation and conversion to roux Y gastric bypass, need for open surgery, injury to spleen or surrounding structures, DVT's, PE, and death again discussed with the patient and the patient expressed understanding and desires to proceed with laparoscopic vertical sleeve gastrectomy, possible open, intraoperative endoscopy.  I explained this is to  be a conversion that she would be at a slightly higher risk for leak and/or bleeding and the potential extended hospitalization.   We discussed that before and after surgery that there would be an alteration in their diet. I  explained that we have put them on a diet 2 weeks before surgery. I also explained that they would be on a liquid diet for 2 weeks after surgery. We discussed that they would have to avoid certain foods after surgery. We discussed the importance of physical activity as well as compliance with our dietary and supplement recommendations and routine follow-up.  We will start with an upper GI. She was also given access to the EMMI video via regarding sleeve gastrectomy.  [name]. Andrey Campanile, MD, FACS General, Bariatric, & Minimally Invasive Surgery [facility], [place]         [facility] M 07/05/2013, 11:57 AM

## 2013-07-05 NOTE — Patient Instructions (Addendum)
We will get an upper GI to evaluate your anatomy. Our coordinator will start investigating your coverage  1. Stay on liquids for the next 2 days as you adapt to your new fill volume.  Then resume your previous diet. 2. Decreasing your carbohydrate intake will hasten you weight loss.  Rely more on proteins for your meals.  Avoid condiments that contain sweets such as Honey Mustard and sugary salad dressings.   3. Stay in the "green zone".  If you are regurgitating with meals, having night time reflux, and find yourself eating soft comfort foods (mashed potatoes, potato chips)...realize that you are developing "maladaptive eating".  You will not lose weight this way and may regain weight.  The GREEN ZONE is eating smaller portions and not regurgitating.  Hence we may need to withdraw fluid from your band. 4. Build exercise into your daily routine.  Walking is the best way to start but do something every day if you can.

## 2013-07-05 NOTE — Telephone Encounter (Signed)
Pt returned phone call and given all information for her UGI, including NPO after MN, arrive 8:15 am and the location.  She understands all.

## 2013-07-05 NOTE — Telephone Encounter (Signed)
LMOM for pt to return my call.  I was calling to inform her of the appt for her UGI at [facility] on 07/06/13 with an arrival time of 8:15am.  Pt is to be NPO after midnight.

## 2013-07-06 ENCOUNTER — Other Ambulatory Visit (INDEPENDENT_AMBULATORY_CARE_PROVIDER_SITE_OTHER): Payer: Self-pay | Admitting: General Surgery

## 2013-07-06 ENCOUNTER — Telehealth: Payer: Self-pay | Admitting: Cardiology

## 2013-07-06 ENCOUNTER — Telehealth (INDEPENDENT_AMBULATORY_CARE_PROVIDER_SITE_OTHER): Payer: Self-pay

## 2013-07-06 ENCOUNTER — Ambulatory Visit
Admission: RE | Admit: 2013-07-06 | Discharge: 2013-07-06 | Disposition: A | Discharge status: Home / Self Care | Payer: Medicare Other | Source: Ambulatory Visit | Attending: General Surgery | Admitting: General Surgery

## 2013-07-06 DIAGNOSIS — E669 Obesity, unspecified: Secondary | ICD-10-CM

## 2013-07-06 DIAGNOSIS — Z9884 Bariatric surgery status: Secondary | ICD-10-CM

## 2013-07-06 NOTE — Telephone Encounter (Signed)
Pt notified that the UGI is normal per Dr Andrey CampanileWilson. Pt advised to continue with the evaluation of the sleeve. The pt understands.

## 2013-07-06 NOTE — Telephone Encounter (Signed)
Walk in pt Form " Pt Needs Letter" Debra back Monday will Give to Her Then 1.23.15/kdm

## 2013-07-09 ENCOUNTER — Telehealth (INDEPENDENT_AMBULATORY_CARE_PROVIDER_SITE_OTHER): Payer: Self-pay

## 2013-07-09 ENCOUNTER — Encounter (INDEPENDENT_AMBULATORY_CARE_PROVIDER_SITE_OTHER): Payer: Self-pay

## 2013-07-09 NOTE — Telephone Encounter (Signed)
[facility] they have a new fax number now re-faxed cardiac clearance @ [phone].

## 2013-07-09 NOTE — Telephone Encounter (Signed)
Pre-Op Cardiac Clearance faxed to Dr. Jens Somrenshaw @ [phone]

## 2013-07-10 ENCOUNTER — Telehealth: Payer: Self-pay | Admitting: Cardiology

## 2013-07-10 NOTE — Telephone Encounter (Signed)
Spoke with pt husband, aware dr Jens Somcrenshaw has cleared for surgery. I spoke with someone at [facility] yesterday and they are going to fax their clearance form to us. As of today I have not received. Aware I will let them know as soon as this is taken care of. Pt agreed with this plan.

## 2013-07-10 NOTE — Telephone Encounter (Signed)
New Problem:  Pt's husband states Dr. Gaynelle AduEric [name] at [facility] needs clearance for sleeve surgery.  Pt's husband would like a call back on the status.  [facility] phone [phone] Fax [phone]

## 2013-08-02 ENCOUNTER — Encounter (INDEPENDENT_AMBULATORY_CARE_PROVIDER_SITE_OTHER): Payer: Medicare Other

## 2013-08-10 ENCOUNTER — Encounter (INDEPENDENT_AMBULATORY_CARE_PROVIDER_SITE_OTHER): Payer: Medicare Other | Admitting: General Surgery

## 2013-08-16 ENCOUNTER — Encounter (INDEPENDENT_AMBULATORY_CARE_PROVIDER_SITE_OTHER): Payer: Self-pay | Admitting: General Surgery

## 2013-08-16 ENCOUNTER — Other Ambulatory Visit (INDEPENDENT_AMBULATORY_CARE_PROVIDER_SITE_OTHER): Payer: Self-pay | Admitting: General Surgery

## 2013-08-16 ENCOUNTER — Ambulatory Visit (INDEPENDENT_AMBULATORY_CARE_PROVIDER_SITE_OTHER): Payer: Medicare Other | Admitting: General Surgery

## 2013-08-16 VITALS — BP 106/70 | HR 72 | Temp 98.7°F | Resp 14 | Ht 67.0 in | Wt 217.6 lb

## 2013-08-16 DIAGNOSIS — E669 Obesity, unspecified: Secondary | ICD-10-CM

## 2013-08-16 DIAGNOSIS — Z9884 Bariatric surgery status: Secondary | ICD-10-CM

## 2013-08-16 DIAGNOSIS — Z4651 Encounter for fitting and adjustment of gastric lap band: Secondary | ICD-10-CM

## 2013-08-16 NOTE — Patient Instructions (Signed)
Watch EMMI video on lap band Follow up with [facility] DecSara - nutrition  1. Stay on liquids for the next 2 days as you adapt to your new fill volume.  Then resume your previous diet. 2. Decreasing your carbohydrate intake will hasten you weight loss.  Rely more on proteins for your meals.  Avoid condiments that contain sweets such as Honey Mustard and sugary salad dressings.   3. Stay in the "green zone".  If you are regurgitating with meals, having night time reflux, and find yourself eating soft comfort foods (mashed potatoes, potato chips)...realize that you are developing "maladaptive eating".  You will not lose weight this way and may regain weight.  The GREEN ZONE is eating smaller portions and not regurgitating.  Hence we may need to withdraw fluid from your band. 4. Build exercise into your daily routine.  Walking is the best way to start but do something every day if you can.    Eating techniques 20-20-20 (30-30-30) 20 chews, 20 seconds between bites of food, 20 minutes to eat; sometimes you may need 30 chews, 30 seconds etc Use your nondominant hand to eat with Put fork down between bites of food Use a timer after swallowing to reinforce waiting 20-30 sec between bites of food Use a child/infant size utensil Try not to eat while watching TV

## 2013-08-17 NOTE — Progress Notes (Signed)
Subjective:     Patient ID: [name], female   DOB: 09/30/1945, 68 y.o.   MRN: [id number]  HPI 68 year old Caucasian female comes in for short interval followup. She underwent laparoscopic adjustable gastric band surgery several years ago in December 2013. I last saw her several weeks ago. At that time she was frustrated with the lap band. She was frustrated with the frequency of followups. She was interested in conversion to a sleeve gastrectomy. She also expressed frustration that people can't tell a difference in that she has lost weight since surgery. She also thought she would have more resolution of her comorbidities by now. Since her last appointment she states that she is still able to eat large portions of food. She states she is also hungry all the time. She also states that she's lost the sensation of fullness.  She is starting to exercise more frequently. 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 She is also using exercise equipment in her own home.  PMHx, PSHx, SOCHx, FAMHx, ALL reviewed   Review of Systems 10 point ROS performed and negative except for above    Objective:   Physical Exam BP 106/70  Pulse 72  Temp(Src) 98.7 F (37.1 C) (Oral)  Resp 14  Ht 5\' 7"  (1.702 m)  Wt 217 lb 9.6 oz (98.703 kg)  BMI 34.07 kg/m2  See LapBand flowsheet  Gen: alert, NAD, non-toxic appearing HEENT: normocephalic, atraumatic; pupils equal, no scleral icterus, neck supple, no lymphadenopathy Pulm: Lungs clear to auscultation, symmetric chest rise CV: regular rate and rhythm Abd: soft, nontender, nondistended. Well-healed trocar sites. No incisional hernia. Port is in right mid-abdomen Ext: no edema, normal,  Neuro: nonfocal Psych: appropriate, judgment normal     Assessment:     Status post laparoscopic adjustable gastric band placement Hypertension-improved Hypothyroidism Hyperlipidemia-improved     Plan:     I recognized her frustration  with the lap band. I reminded her that the lap dand does require ongoing routine care and maintenance In followup. However she has maintained her weight loss since surgery. Her preoperative weight was 252 pounds. She has maintained this for the most part.  Because she has ongoing hunger and is able to eat large portions of food I recommended an adjustment today.  After obtaining verbal consent, the abdominal wall was prepped with Chloraprep. The port was accessed with a Huber needle and 0.5 cc of saline was added.  The patient was able to tolerate sips of water.  She was reminded to stay on liquids for the next 24-46 hours. We discussed proper eating techniques and behaviors again with respect to the LAP-BAND.  With respect to converting her to a sleeve gastrectomy, had a very prolonged and frank conversation with her husband. While technically feasible I explained that any time we do revisional surgery the standard risk that can occur are increased such as bleeding and leak. I explained that her risk of leak could be as high as 3%. We also discussed the possibility of having to do a staged procedure in the event there be would be trauma to her stomach while taking out the LAP-BAND. We discussed that if she were to develop a leak that it could be a very prolonged hospitalization requiring multiple ongoing procedures. While I think a leak rate of 1-3% is reasonable expected rate for a revision based on literature, we discussed that even with a sleeve gastrectomy that proper food choice and regular exercise  are essential for long term success. We discussed that people can regain weight with sleeve or bypass surgery.  We also discussed that older patients (>54yr) are known to have higher risk for bariatric surgery.   After much discussion, she has no chosen to proceed with revisional surgery at this time.   I did encourage her to watch the EMMI videos on LapBand adjustments. We will also refer her to  nutrition for a refresher about the LapBand.  She was encouraged to continue to exercise Follow up with me in 8 weeks  [name]. Andrey Campanile, MD, FACS General, Bariatric, & Minimally Invasive Surgery [facility], [place]

## 2013-08-21 ENCOUNTER — Telehealth (INDEPENDENT_AMBULATORY_CARE_PROVIDER_SITE_OTHER): Payer: Self-pay | Admitting: *Deleted

## 2013-08-21 NOTE — Telephone Encounter (Signed)
Pt husband Ernie called stating pt having difficulty holding anything down.  Reports nausea/vomitting.  Offered Urgent office this afternoon, but husband requested to see Dr. Andrey CampanileWilson 08/22/13,  Spoke to [place] and she said have pt come 12:15 08/22/13 to be worked in.  Pt husband also advised if pt worsens to take her to ER.  Husband understand and is in agreeance.Marland Kitchen.jkw

## 2013-08-22 ENCOUNTER — Ambulatory Visit (INDEPENDENT_AMBULATORY_CARE_PROVIDER_SITE_OTHER): Payer: Medicare Other | Admitting: General Surgery

## 2013-08-22 ENCOUNTER — Encounter (INDEPENDENT_AMBULATORY_CARE_PROVIDER_SITE_OTHER): Payer: Self-pay | Admitting: General Surgery

## 2013-08-22 VITALS — BP 130/78 | HR 75 | Temp 98.6°F | Resp 12 | Ht 67.0 in | Wt 231.0 lb

## 2013-08-22 DIAGNOSIS — Z9884 Bariatric surgery status: Secondary | ICD-10-CM

## 2013-08-22 NOTE — Patient Instructions (Signed)
1. Stay on liquids for the next 2 days as you adapt to your new fill volume.  Then resume your previous diet. 2. Decreasing your carbohydrate intake will hasten you weight loss.  Rely more on proteins for your meals.  Avoid condiments that contain sweets such as Honey Mustard and sugary salad dressings.   3. Stay in the "green zone".  If you are regurgitating with meals, having night time reflux, and find yourself eating soft comfort foods (mashed potatoes, potato chips)...realize that you are developing "maladaptive eating".  You will not lose weight this way and may regain weight.  The GREEN ZONE is eating smaller portions and not regurgitating.  Hence we may need to withdraw fluid from your band. 4. Build exercise into your daily routine.  Walking is the best way to start but do something every day if you can.    Don't eat anything solid first thing in the morning  Watch emmi video on LapBand

## 2013-08-23 NOTE — Progress Notes (Signed)
Subjective:     Patient ID: [name], female   DOB: 09/01/1945, 68 y.o.   MRN: [id number]  HPI Patient comes in after a lap band fill on March 6. At that time we added 0.5 cc of saline to her pain. She states after that visit she had difficulty with solids and liquids. She did have some regurgitation of solids. She denies any nighttime cough. She denies any worsening reflux. Interestingly her medications are going down without a problem. Despite eating correctly with the proper eating techniques she reports difficulty with both solids and liquids including protein shakes. She reports more trouble with eating first thing in the morning.  Review of Systems     Objective:   Physical Exam BP 130/78  Pulse 75  Temp(Src) 98.6 F (37 C) (Oral)  Resp 12  Ht 5\' 7"  (1.702 m)  Wt 231 lb (104.781 kg)  BMI 36.17 kg/m2 Alert, nad Soft, nt, nd. Port in right mid abdomen    Assessment:     Status post laparoscopic adjustable gastric band placement Regurgitation     Plan:     Her band appears to be too tight.   After obtaining verbal consent, the abdominal wall was prepped with Chloraprep. The port was accessed with a Huber needle and 0.25 cc of saline was removed.  The patient was able to tolerate sips of water.  She states that she was able to tell an immediate difference. She was advised to stay on liquids for the next 24 hours.  We did discuss that some women have more difficulty with solids first thing in morning due to body fluid shifts. I encouraged her to stick with softer foods and liquids first thing in the morning. Followup as scheduled in the next several weeks  Mary SellaEric M. Andrey CampanileWilson, MD, FACS General, Bariatric, & Minimally Invasive Surgery [facility] [place]

## 2013-09-24 ENCOUNTER — Encounter (INDEPENDENT_AMBULATORY_CARE_PROVIDER_SITE_OTHER): Payer: Self-pay | Admitting: General Surgery

## 2013-09-24 ENCOUNTER — Telehealth (INDEPENDENT_AMBULATORY_CARE_PROVIDER_SITE_OTHER): Payer: Self-pay | Admitting: General Surgery

## 2013-09-24 ENCOUNTER — Ambulatory Visit (INDEPENDENT_AMBULATORY_CARE_PROVIDER_SITE_OTHER): Payer: Medicare Other | Admitting: General Surgery

## 2013-09-24 VITALS — BP 116/80 | HR 80 | Temp 97.5°F | Resp 14 | Ht 67.0 in | Wt 208.8 lb

## 2013-09-24 DIAGNOSIS — IMO0001 Reserved for inherently not codable concepts without codable children: Secondary | ICD-10-CM | POA: Insufficient documentation

## 2013-09-24 DIAGNOSIS — R1110 Vomiting, unspecified: Secondary | ICD-10-CM

## 2013-09-24 DIAGNOSIS — Z4651 Encounter for fitting and adjustment of gastric lap band: Secondary | ICD-10-CM

## 2013-09-24 DIAGNOSIS — E669 Obesity, unspecified: Secondary | ICD-10-CM

## 2013-09-24 NOTE — Telephone Encounter (Signed)
Pt's husband called to report wife has been unable to hold down water for last 4-5 days and needs to come in for fluid removal.  No bariatric MDs in the office, but Dr. Donell BeersByerly will see her this afternoon to accomplish removal of fluid.

## 2013-09-24 NOTE — Assessment & Plan Note (Addendum)
Pt with lap band too tight.  Removed just over 0.2 mL.    Still seemed tight.    Removed another 1 mL.    Swallowing water OK.  Advised to stay on liquids for 24 hours.

## 2013-09-24 NOTE — Progress Notes (Signed)
Subjective:     Patient ID: [name], female   DOB: 08/01/1945, 68 y.o.   MRN: [id number]  HPI Patient comes in after a lap band fill on March 6. At that time we added 0.5 cc of saline to her band.  She came back 3/11 with regurgitation, and Dr. Andrey CampanileWilson removed 0.25 of the fluid.  She immediately felt better at that time.  She was ok for several days, however, she then developed regurgitation with full liquids, and now cannot even keep down water.  She is not doing her protein shakes.  She is feeling hungry and weak.    Review of Systems     Objective:   Physical Exam BP 116/80  Pulse 80  Temp(Src) 97.5 F (36.4 C) (Temporal)  Resp 14  Ht 5\' 7"  (1.702 m)  Wt 208 lb 12.8 oz (94.711 kg)  BMI 32.69 kg/m2 Alert, nad Soft, non tender, non distended.   Looks uncomfortable.       Assessment/Plan:  Regurgitation Pt with lap band too tight.  Removed just over 0.2 mL.    Still seemed tight.    Removed another 1 mL.    Swallowing water OK.  Advised to stay on liquids for 24 hours.      She has follow up with Dr. Andrey CampanileWilson next week.    Port site cleaned with chlorprep.  Huber needle used to access port sterilely.  4.2 mL withdrawn.  3.9 replaced.  Pt still with difficulty.  3.9 withdrawn, all but 1 mL replaced.

## 2013-09-24 NOTE — Patient Instructions (Signed)
Stay on liquids the next 24 hours.  Follow up with Dr. Andrey CampanileWilson.

## 2013-10-03 ENCOUNTER — Encounter (INDEPENDENT_AMBULATORY_CARE_PROVIDER_SITE_OTHER): Payer: Medicare Other | Admitting: General Surgery

## 2013-10-08 ENCOUNTER — Telehealth (INDEPENDENT_AMBULATORY_CARE_PROVIDER_SITE_OTHER): Payer: Self-pay

## 2013-10-08 NOTE — Telephone Encounter (Signed)
Pt calling in for sooner appt. She is okay with seeing [name]. Advised the lady that makes all lap band appts is off this afternoon and I will have her call with an appt tomorrow.

## 2013-10-11 ENCOUNTER — Ambulatory Visit (INDEPENDENT_AMBULATORY_CARE_PROVIDER_SITE_OTHER): Payer: Medicare Other | Admitting: Physician Assistant

## 2013-10-11 ENCOUNTER — Encounter (INDEPENDENT_AMBULATORY_CARE_PROVIDER_SITE_OTHER): Payer: Self-pay

## 2013-10-11 VITALS — BP 128/72 | HR 74 | Ht 67.0 in | Wt 220.8 lb

## 2013-10-11 DIAGNOSIS — Z4651 Encounter for fitting and adjustment of gastric lap band: Secondary | ICD-10-CM

## 2013-10-11 NOTE — Patient Instructions (Signed)

## 2013-10-11 NOTE — Progress Notes (Signed)
  HISTORY: [name] is a 68 y.o.female who received an AP-Standard lap-band in December 2013 by Dr. Andrey CampanileWilson. She comes in with 12 lbs weight gain since removal of fluid by Dr. Donell BeersByerly for obstruction on 13 April. She reports having significant hunger and larger portion sizes since then, as expected. She has a history of difficulty finding the 'green zone' with her band. There has been discussion of conversion to Sleeve Gastrectomy but that consideration has been put on hold. She is having significant issues with her back for which she is seeing a [place]. She would like a fill today to keep her weight gain under control.  VITAL SIGNS: Filed Vitals:   10/11/13 1101  BP: 128/72  Pulse: 74    PHYSICAL EXAM: Physical exam reveals a very well-appearing 68 y.o.female in no apparent distress Neurologic: Awake, alert, oriented Psych: Bright affect, conversant Respiratory: Breathing even and unlabored. No stridor or wheezing Abdomen: Soft, nontender, nondistended to palpation. Incisions well-healed. No incisional hernias. Port easily palpated. Extremities: Atraumatic, good range of motion.  ASSESMENT: 68 y.o.  female  s/p AP-Standard lap-band.   PLAN: The patient's port was accessed with a 20G Huber needle without difficulty. Clear fluid was aspirated and 0.75 mL saline was added to the port to give a total predicted volume of 3.75 mL. The patient was able to swallow water without difficulty following the procedure and was instructed to take clear liquids for the next 24-48 hours and advance slowly as tolerated. She will see me again in May at her already scheduled appointment.

## 2013-10-12 ENCOUNTER — Telehealth (INDEPENDENT_AMBULATORY_CARE_PROVIDER_SITE_OTHER): Payer: Self-pay

## 2013-10-12 NOTE — Telephone Encounter (Signed)
Pt called stating since having lap band adjustment this week she now has not restriction. Pt request appt next week with PA. [facility]

## 2013-10-18 ENCOUNTER — Encounter (INDEPENDENT_AMBULATORY_CARE_PROVIDER_SITE_OTHER): Payer: Medicare Other

## 2013-10-25 ENCOUNTER — Encounter (INDEPENDENT_AMBULATORY_CARE_PROVIDER_SITE_OTHER): Payer: Medicare Other

## 2013-11-08 ENCOUNTER — Encounter (INDEPENDENT_AMBULATORY_CARE_PROVIDER_SITE_OTHER): Payer: Medicare Other

## 2013-11-15 ENCOUNTER — Encounter (INDEPENDENT_AMBULATORY_CARE_PROVIDER_SITE_OTHER): Payer: Medicare Other

## 2013-12-26 ENCOUNTER — Encounter (INDEPENDENT_AMBULATORY_CARE_PROVIDER_SITE_OTHER): Payer: Medicare Other | Admitting: General Surgery

## 2014-01-10 ENCOUNTER — Ambulatory Visit (INDEPENDENT_AMBULATORY_CARE_PROVIDER_SITE_OTHER): Payer: Medicare Other | Admitting: Physician Assistant

## 2014-01-10 ENCOUNTER — Encounter (INDEPENDENT_AMBULATORY_CARE_PROVIDER_SITE_OTHER): Payer: Self-pay | Admitting: Physician Assistant

## 2014-01-10 VITALS — BP 128/80 | HR 73 | Temp 97.0°F | Ht 67.0 in | Wt 228.0 lb

## 2014-01-10 DIAGNOSIS — Z4651 Encounter for fitting and adjustment of gastric lap band: Secondary | ICD-10-CM

## 2014-01-10 NOTE — Progress Notes (Signed)
  HISTORY: Rachel GougeJanet L Drake is a 68 y.o.female who received an AP-Standard lap-band in December 2013 by Dr. Andrey CampanileWilson. The patient has gained eight lbs since their last visit in April, and has lost 24 lbs since surgery. She is complaining of larger than desired portion sizes. She is very frustrated with her rate of weight loss. She has no persistent regurgitation symptoms unless she eats foods that she usually can't tolerate. She'd like a fill today.  VITAL SIGNS: Filed Vitals:   01/10/14 0921  BP: 128/80  Pulse: 73  Temp: 97 F (36.1 C)    PHYSICAL EXAM: Physical exam reveals a very well-appearing 68 y.o.female in no apparent distress Neurologic: Awake, alert, oriented Psych: Bright affect, conversant Respiratory: Breathing even and unlabored. No stridor or wheezing Abdomen: Soft, nontender, nondistended to palpation. Incisions well-healed. No incisional hernias. Port easily palpated. Extremities: Atraumatic, good range of motion.  ASSESMENT: 68 y.o.  female  s/p AP-Standard lap-band.   PLAN: The patient's port was accessed with a 20G Huber needle without difficulty. Clear fluid was aspirated and 0.4 mL saline was added to the port to give a total predicted volume of 4.45 mL. The patient was able to swallow water without difficulty following the procedure and was instructed to take clear liquids for the next 24-48 hours and advance slowly as tolerated. She's scheduled to see Dr. Andrey CampanileWilson in August.

## 2014-01-10 NOTE — Patient Instructions (Signed)

## 2014-01-30 ENCOUNTER — Other Ambulatory Visit (INDEPENDENT_AMBULATORY_CARE_PROVIDER_SITE_OTHER): Payer: Self-pay | Admitting: General Surgery

## 2014-01-30 ENCOUNTER — Encounter (INDEPENDENT_AMBULATORY_CARE_PROVIDER_SITE_OTHER): Payer: Self-pay | Admitting: General Surgery

## 2014-01-30 ENCOUNTER — Ambulatory Visit (INDEPENDENT_AMBULATORY_CARE_PROVIDER_SITE_OTHER): Payer: Medicare Other | Admitting: General Surgery

## 2014-01-30 VITALS — BP 128/78 | HR 75 | Temp 97.5°F | Ht 67.0 in | Wt 228.8 lb

## 2014-01-30 DIAGNOSIS — E669 Obesity, unspecified: Secondary | ICD-10-CM

## 2014-01-30 DIAGNOSIS — IMO0001 Reserved for inherently not codable concepts without codable children: Secondary | ICD-10-CM

## 2014-01-30 DIAGNOSIS — Z45018 Encounter for adjustment and management of other part of cardiac pacemaker: Secondary | ICD-10-CM

## 2014-01-30 DIAGNOSIS — R1110 Vomiting, unspecified: Secondary | ICD-10-CM

## 2014-01-30 DIAGNOSIS — Z9884 Bariatric surgery status: Secondary | ICD-10-CM

## 2014-01-30 NOTE — Patient Instructions (Signed)
1. Stay on liquids for the next 1-2 days as you adapt to your new fill volume.  Then resume your previous diet. 2. Decreasing your carbohydrate intake will hasten you weight loss.  Rely more on proteins for your meals.  Avoid condiments that contain sweets such as Honey Mustard and sugary salad dressings.   3. Stay in the "green zone".  If you are regurgitating with meals, having night time reflux, and find yourself eating soft comfort foods (mashed potatoes, potato chips)...realize that you are developing "maladaptive eating".  You will not lose weight this way and may regain weight.  The GREEN ZONE is eating smaller portions and not regurgitating.  Hence we may need to withdraw fluid from your band. 4. Build exercise into your daily routine.  Walking is the best way to start but do something every day if you can.    Congratulations on starting your journey to a healthier life! Over the next few weeks you will be undergoing tests (x-rays and labs) and seeing specialists to help evaluate you for weight loss surgery.  These tests and consultations with a psychologist and nutritionist are needed to prepare you for the lifestyle changes that lie ahead and are often required by insurance companies to approve you for surgery.   Pathway to Surgery:  Over the next few weeks -->Lab work -->Radiology tests   - Chest x-ray - make sure your lungs are normal before surgery  - Upper GI - you drink barium and pictures are taken as it travels down your  esophagus and into your stomach - looks for reflux and a hiatal hernia which may  need to repaired at the same time as your weight loss surgery ->EKG  -->Nutrition consultation -->Psychologist consultation -->Other specialist consults - your surgeon may determine that you need to see a  specialist like a cardiologist or pulmnologist depending on your health history -->Watch EMMI video about your planned weight loss surgery -->you can look at www.[email or web address] to  learn more about weight loss surgery and  compare surgery outcomes -->you can look at our new website - [email or web address] - available Sept 2015  Two weeks prior to surgery  Go on the extremely low carb liquid diet - this will decrease the size of your liver  which will make surgery safer - the nutritionist will go over this at a later date  Attend preoperative appointment with your surgeon  Attend preoperative surgery class  One week prior to surgery  No aspirin products.  Tylenol is acceptable   24 hours prior to surgery  No alcoholic beverages  Report fever greater than 100.5 or excessive nasal drainage suggesting infection  Continue bariatric preop diet  Perform bowel prep if ordered  Do not eat or drink anything after midnight the night before surgery  Do not take any medications except those instructed by the anesthesiologist  Morning of surgery  Please arrive at the hospital at least 2 hours before your scheduled surgery time.  No makeup, fingernail polish or jewelry  Bring insurance cards with you  Bring your CPAP mask if you use this

## 2014-01-31 NOTE — Progress Notes (Addendum)
Patient ID: [name], female   DOB: 02/04/1946, 68 y.o.   MRN: [id number]  Chief Complaint  Patient presents with  . eval for for gastric sleeve    HPI Rachel GougeJanet L Gracy is a 68 y.o. female.   HPI 68 year old obese Caucasian female comes in for long-term followup. She underwent placement of a laparoscopic adjustable gastric band in December 2013. Over the past year or so she has been having ongoing issues with satisfactory weight loss. We have been struggling to find the correct amount of fluid to have in her band. She is also using correct eating techniques and behaviors as well is eating correct foods but is still not losing adequate amounts of weight. She comes in today to discuss possible revision surgery. She is interested in a sleeve gastrectomy.  Most recently with respect to her band. She states that she has been unable to keep solid food down without it coming back up. She is able to keep liquids down without a problem. She denies any nighttime cough or reflux. Any type of meats, salad, or green beans she will regurgitate despite using proper eating techniques. She is walking and swimming on a regular basis Past Medical History  Diagnosis Date  . Palpitations     history  . Depression   . Hyperlipidemia   . Hypertension   . Hypothyroidism   . Obesity (BMI 30-39.9)   . CAD (coronary artery disease)   . Chronic kidney disease     normal kidney function; left kidney removed 2012  . Arthritis   . Fibromyalgia   . PONV (postoperative nausea and vomiting)     nausea only    Past Surgical History  Procedure Laterality Date  . Appendectomy    . Cholecystectomy    . Tonsillectomy    . Total knee arthroplasty  2003 (R); 2010 (L); 2012 (L clean-out)    Bilateral  . Laparoscopic incisional / umbilical / ventral hernia repair  2004    upper midline hernia, 15 x 19cm dual Goretex mesh  . 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 Total abdominal hysterectomy   1978  . Laparoscopic gastric banding  05/22/2012    Procedure: LAPAROSCOPIC GASTRIC BANDING;  Surgeon: Atilano InaEric M Briyanna Billingham, MD,FACS;  Location: [facility];  Service: General;  Laterality: N/A;  with mesh to bandport  . Laparoscopic lysis of adhesions  05/22/2012    Procedure: LAPAROSCOPIC LYSIS OF ADHESIONS;  Surgeon: Atilano InaEric M Yoltzin Barg, MD,FACS;  Location: [facility];  Service: General;  Laterality: N/A;  For 30 minutes  . Mesh applied to lap port  05/22/2012    Procedure: MESH APPLIED TO LAP PORT;  Surgeon: Atilano InaEric M Manya Balash, MD,FACS;  Location: [facility];  Service: General;;    Family History  Problem Relation Age of Onset  . Adopted: Yes    Social History History  Substance Use Topics  . Smoking status: Never Smoker   . Smokeless tobacco: Never Used  . Alcohol Use: Yes     Comment: once a week 1 wine cooler    No Known Allergies  Current Outpatient Prescriptions  Medication Sig Dispense Refill  . amLODipine (NORVASC) 5 MG tablet Take 1 tablet (5 mg total) by mouth daily.  30 tablet  1  . aspirin 81 MG EC tablet Take 81 mg by mouth every morning.       Marland Kitchen. buPROPion (WELLBUTRIN) 75 MG tablet Take 1 tablet (75 mg total) by mouth 2 (two) times  daily.  60 tablet  1  . calcium carbonate (CALCIUM 600) 600 MG TABS tablet Take 600 mg by mouth.      . Calcium Carbonate-Vitamin D (CALTRATE 600+D) 600-400 MG-UNIT per tablet Take 1 tablet by mouth every morning.       . Cholecalciferol (VITAMIN D3) 1000 UNITS CAPS Take 1 capsule by mouth daily after lunch.       . clonazePAM (KLONOPIN) 2 MG tablet Take 2 mg by mouth at bedtime.      Marland Kitchen dextromethorphan-guaiFENesin (MUCINEX DM) 30-600 MG per 12 hr tablet Take 2 tablets by mouth 2 (two) times daily.      Marland Kitchen doxepin (SINEQUAN) 25 MG capsule Take 50 mg by mouth at bedtime.       . DULoxetine (CYMBALTA) 30 MG capsule Take 30 mg by mouth 3 (three) times daily.       Marland Kitchen levothyroxine (SYNTHROID, LEVOTHROID) 75 MCG tablet Take 75 mcg by mouth daily before breakfast. Pt gets  brand name      . Multiple Vitamin (MULTIVITAMIN) tablet Take 1 tablet by mouth daily.      . NON FORMULARY Take 1 capsule by mouth daily at 3 pm. MEGA RED CAP      . pregabalin (LYRICA) 75 MG capsule Take 75 mg by mouth 2 (two) times daily.      Marland Kitchen pyridOXINE (VITAMIN B-6) 100 MG tablet Take 100 mg by mouth every morning.       . rosuvastatin (CRESTOR) 20 MG tablet Take 20 mg by mouth at bedtime.       . vitamin B-12 (CYANOCOBALAMIN) 1000 MCG tablet Take 1,000 mcg by mouth every evening.        No current facility-administered medications for this visit.    Review of Systems Review of Systems  Constitutional: Negative for fever, activity change, appetite change and unexpected weight change.  HENT: Negative for nosebleeds and trouble swallowing.   Eyes: Negative for photophobia and visual disturbance.  Respiratory: Negative for chest tightness and shortness of breath.   Cardiovascular: Negative for chest pain and leg swelling.       Denies CP, SOB, orthopnea, PND, DOE  Gastrointestinal: Positive for vomiting. Negative for nausea, abdominal pain, diarrhea, constipation and abdominal distention.  Genitourinary: Negative for dysuria and difficulty urinating.  Musculoskeletal: Negative for arthralgias.  Skin: Negative for pallor and rash.  Neurological: Negative for dizziness, seizures, facial asymmetry and numbness.       Denies TIA and amaurosis fugax   Hematological: Negative for adenopathy. Does not bruise/bleed easily.  Psychiatric/Behavioral: Negative for behavioral problems and agitation.    Blood pressure 128/78, pulse 75, temperature 97.5 F (36.4 C), height 5\' 7"  (1.702 m), weight 228 lb 12.8 oz (103.783 kg).  Physical Exam Physical Exam  Vitals reviewed. Constitutional: She is oriented to person, place, and time. She appears well-developed and well-nourished. No distress.  Obese, central truncal  HENT:  Head: Normocephalic and atraumatic.  Right Ear: External ear normal.   Left Ear: External ear normal.  Eyes: Conjunctivae are normal. No scleral icterus.  Neck: Normal range of motion. Neck supple. No tracheal deviation present. No thyromegaly present.  Cardiovascular: Normal rate and normal heart sounds.   Pulmonary/Chest: Effort normal and breath sounds normal. No stridor. No respiratory distress. She has no wheezes.  Abdominal: Soft. She exhibits no distension. There is no tenderness. There is no rebound and no guarding.  Port in mid rt abdomen. Well healed trocar sites  Musculoskeletal: She exhibits no  edema and no tenderness.  Lymphadenopathy:    She has no cervical adenopathy.  Neurological: She is alert and oriented to person, place, and time. She exhibits normal muscle tone.  Skin: Skin is warm and dry. No rash noted. She is not diaphoretic. No erythema.  Psychiatric: She has a normal mood and affect. Her behavior is normal. Judgment and thought content normal.    Data Reviewed Recent office visit notes, Upper GI from last fall.  Assessment    Morbid obesity BMI 35 Hypertension Hypothyroidism Hyperlipidemia Depression History of coronary artery disease  Status post laparoscopic adjustable gastric band placement December 2013    Plan    First, we addressed her inability to tolerate solids. I do think she needs an adjustment today. She appears to be eating correctly and using correct eating techniques.  After obtaining verbal consent, the abdominal wall was prepped with Chloraprep. The port was accessed with a Huber needle and 1.5 cc of saline was removed to give the patient an expected fill volume of 3 cc.  The patient was able to tolerate sips of water. She was instructed to stay on liquids for the next 24 hours. I also instructed her to contact the office should she have ongoing regurgitation of solid foods.  We then turned our attention to revising her to a sleeve gastrectomy. This is the second office visit where we have addressed this.  It does appear that she is given an honest effort at eating correctly, using correct eating techniques as well as exercising. She is frustrated that is still requiring ongoing medications for her co morbidities and is less than satisfied with her less than 30 weight loss since surgery.  We discussed revisional surgery. I explained that she is at increased risk for perioperative and postoperative complications because of her age, unilateral kidney, and the fact that this is revisional surgery. She was accompanied by her husband today. We did discuss that once I removed the LAP-BAND and if I felt that the stomach was too inflamed I may not do same day sleeve gastrectomy  We discussed laparoscopic sleeve gastrectomy. We discussed the preoperative, operative and postoperative process. Using diagrams, I explained the surgery in detail including the performance of an EGD near the end of the surgery and an Upper GI swallow study on POD 1. We discussed the typical hospital course including a 2-3 day stay baring any complications.   The patient was given educational material. I quoted the patient that most patients can lose up to 50% of their excess weight. We did discuss the possibility of weight regain several years after the procedure.  The risks of infection, bleeding, pain, scarring, weight regain, too little or too much weight loss, vitamin deficiencies and need for lifelong vitamin supplementation, hair loss, need for protein supplementation, leaks, stricture, reflux, food intolerance, gallstone formation, hernia, need for reoperation and conversion to roux Y gastric bypass, need for open surgery, injury to spleen or surrounding structures, DVT's, PE, and death again discussed with the patient and the patient expressed understanding and desires to proceed with laparoscopic vertical sleeve gastrectomy, possible open, intraoperative endoscopy.  We discussed that before and after surgery that there would be an  alteration in their diet. I explained that we have put them on a diet 2 weeks before surgery. I also explained that they would be on a liquid diet for 2 weeks after surgery. We discussed that they would have to avoid certain foods after surgery. We discussed the importance  of physical activity as well as compliance with our dietary and supplement recommendations and routine follow-up.  I explained to the patient that we will start our evaluation process which includes labs, nutritionist consultation, psychiatrist consultation, EKG, CXR, cardiology consultation. Since we have a recent upper GI I think we will only repeat it she has ongoing issues of regurgitation.   The patient has already watched the EMMI video on sleeve gastrectomy.  ADDENDUM 02/05/14 - reviewed labs that pt had drawn on 01/16/14 - nml cmet, except for Cr 1.29; nml lipid panel. Will call pt and let her know she will need some additional labs.   [name]. Andrey Campanile, MD, FACS General, Bariatric, & Minimally Invasive Surgery [facility]  Note: This dictation was prepared with Dragon/digital dictation along with [facility] technology. Any transcriptional errors that result from this process are unintentional.        Gaynelle Adu M 01/31/2014, 3:06 PM

## 2014-02-01 ENCOUNTER — Telehealth (INDEPENDENT_AMBULATORY_CARE_PROVIDER_SITE_OTHER): Payer: Self-pay

## 2014-02-01 NOTE — Telephone Encounter (Signed)
Made pt aware we still have not received her labs from Dr. Sheryle HailPrachnau's office.  We have faxed two requests along with a record release signed by the patient.  She will call their office today to expedite.

## 2014-02-05 NOTE — Addendum Note (Signed)
Addended by: Gaynelle Adu M on: 02/05/2014 05:48 PM   Modules accepted: Orders

## 2014-02-05 NOTE — Telephone Encounter (Signed)
Patients husband called to ask if we have recieved patients labs from Dr. Shela Commons  . [name]  States she has requested them several times and she will call again today

## 2014-02-06 ENCOUNTER — Telehealth (INDEPENDENT_AMBULATORY_CARE_PROVIDER_SITE_OTHER): Payer: Self-pay

## 2014-02-06 NOTE — Telephone Encounter (Signed)
LMOV pt will need to have additional bloodwork: Hpylori and CBC.  Once Dr. Andrey Campanile receives these results we will call her.  Pt to go to [employer] [facility] [employer].  Orders in Epic.

## 2014-02-07 ENCOUNTER — Encounter (INDEPENDENT_AMBULATORY_CARE_PROVIDER_SITE_OTHER): Payer: Self-pay

## 2014-02-07 LAB — CBC WITH DIFFERENTIAL/PLATELET
BASOS ABS: 0.1 10*3/uL (ref 0.0–0.1)
Basophils Relative: 1 % (ref 0–1)
Eosinophils Absolute: 0.2 10*3/uL (ref 0.0–0.7)
Eosinophils Relative: 3 % (ref 0–5)
HEMATOCRIT: 40.6 % (ref 36.0–46.0)
Hemoglobin: 13.3 g/dL (ref 12.0–15.0)
LYMPHS ABS: 1.8 10*3/uL (ref 0.7–4.0)
LYMPHS PCT: 26 % (ref 12–46)
MCH: 28.5 pg (ref 26.0–34.0)
MCHC: 32.8 g/dL (ref 30.0–36.0)
MCV: 87.1 fL (ref 78.0–100.0)
MONO ABS: 0.6 10*3/uL (ref 0.1–1.0)
Monocytes Relative: 9 % (ref 3–12)
Neutro Abs: 4.2 10*3/uL (ref 1.7–7.7)
Neutrophils Relative %: 61 % (ref 43–77)
Platelets: 225 10*3/uL (ref 150–400)
RBC: 4.66 MIL/uL (ref 3.87–5.11)
RDW: 14.1 % (ref 11.5–15.5)
WBC: 6.9 10*3/uL (ref 4.0–10.5)

## 2014-02-08 LAB — H. PYLORI ANTIBODY, IGG: H Pylori IgG: 0.45 {ISR}

## 2014-02-11 ENCOUNTER — Ambulatory Visit (HOSPITAL_COMMUNITY)
Admission: RE | Admit: 2014-02-11 | Discharge: 2014-02-11 | Disposition: A | Discharge status: Home / Self Care | Payer: Medicare Other | Source: Ambulatory Visit | Attending: General Surgery | Admitting: General Surgery

## 2014-02-11 DIAGNOSIS — I517 Cardiomegaly: Secondary | ICD-10-CM | POA: Diagnosis not present

## 2014-02-11 DIAGNOSIS — I10 Essential (primary) hypertension: Secondary | ICD-10-CM | POA: Diagnosis not present

## 2014-02-11 DIAGNOSIS — E039 Hypothyroidism, unspecified: Secondary | ICD-10-CM | POA: Insufficient documentation

## 2014-02-11 DIAGNOSIS — IMO0001 Reserved for inherently not codable concepts without codable children: Secondary | ICD-10-CM | POA: Diagnosis not present

## 2014-02-11 DIAGNOSIS — I2510 Atherosclerotic heart disease of native coronary artery without angina pectoris: Secondary | ICD-10-CM | POA: Insufficient documentation

## 2014-02-11 DIAGNOSIS — R1110 Vomiting, unspecified: Secondary | ICD-10-CM | POA: Diagnosis not present

## 2014-02-11 DIAGNOSIS — Z6835 Body mass index (BMI) 35.0-35.9, adult: Secondary | ICD-10-CM | POA: Insufficient documentation

## 2014-02-11 DIAGNOSIS — Z9884 Bariatric surgery status: Secondary | ICD-10-CM | POA: Insufficient documentation

## 2014-02-11 DIAGNOSIS — M129 Arthropathy, unspecified: Secondary | ICD-10-CM | POA: Diagnosis not present

## 2014-02-12 ENCOUNTER — Encounter: Payer: Self-pay | Admitting: Cardiology

## 2014-02-12 ENCOUNTER — Ambulatory Visit (INDEPENDENT_AMBULATORY_CARE_PROVIDER_SITE_OTHER): Payer: Medicare Other | Admitting: Cardiology

## 2014-02-12 VITALS — BP 122/72 | HR 78 | Ht 67.0 in | Wt 237.0 lb

## 2014-02-12 DIAGNOSIS — Z905 Acquired absence of kidney: Secondary | ICD-10-CM

## 2014-02-12 DIAGNOSIS — I2510 Atherosclerotic heart disease of native coronary artery without angina pectoris: Secondary | ICD-10-CM

## 2014-02-12 DIAGNOSIS — E785 Hyperlipidemia, unspecified: Secondary | ICD-10-CM

## 2014-02-12 DIAGNOSIS — Z9884 Bariatric surgery status: Secondary | ICD-10-CM

## 2014-02-12 DIAGNOSIS — Z01818 Encounter for other preprocedural examination: Secondary | ICD-10-CM

## 2014-02-12 DIAGNOSIS — I10 Essential (primary) hypertension: Secondary | ICD-10-CM

## 2014-02-12 DIAGNOSIS — E669 Obesity, unspecified: Secondary | ICD-10-CM

## 2014-02-12 NOTE — Patient Instructions (Signed)
[name] recommends that you schedule a follow-up appointment in 6 months with Dr Jens Som.

## 2014-02-12 NOTE — Progress Notes (Signed)
02/12/2014 [name]   1945-06-27  [id number]  Primary Physicia PROCHNAU,CAROLINE, MD Primary Cardiologist: Dr Jens Som  HPI:  68 y/o female from [place] followed by Dr Jens Som with a history of chest pain and abnormal Myoview in 2009. She had a cath in Jan 2010 that showed Nl LVF and 50-60% RCA. She has done well, no angina. She has a history of prior Lt nephrectomy in April 2011 for renal cell cancer. She had lap banding in Dec 2013  And was admitted a few days later with dehydration and acute renal insufficiency with a SCr of 4. She responded to IVF. She is not on an ACE or diuretic now. She is here today because she needs cardiac clearance for bariatric surgery.    Current Outpatient Prescriptions  Medication Sig Dispense Refill  . amLODipine (NORVASC) 5 MG tablet Take 1 tablet (5 mg total) by mouth daily.  30 tablet  1  . aspirin 81 MG EC tablet Take 81 mg by mouth every morning.       Marland Kitchen buPROPion (WELLBUTRIN) 75 MG tablet Take 1 tablet (75 mg total) by mouth 2 (two) times daily.  60 tablet  1  . calcium carbonate (CALCIUM 600) 600 MG TABS tablet Take 600 mg by mouth.      . Calcium Carbonate-Vitamin D (CALTRATE 600+D) 600-400 MG-UNIT per tablet Take 1 tablet by mouth every morning.       . Cholecalciferol (VITAMIN D3) 1000 UNITS CAPS Take 1 capsule by mouth daily after lunch.       . clonazePAM (KLONOPIN) 2 MG tablet Take 2 mg by mouth at bedtime.      Marland Kitchen dextromethorphan-guaiFENesin (MUCINEX DM) 30-600 MG per 12 hr tablet Take 2 tablets by mouth 2 (two) times daily.      Marland Kitchen doxepin (SINEQUAN) 25 MG capsule Take 50 mg by mouth at bedtime.       . DULoxetine (CYMBALTA) 30 MG capsule Take 30 mg by mouth 3 (three) times daily.       Marland Kitchen levothyroxine (SYNTHROID, LEVOTHROID) 75 MCG tablet Take 75 mcg by mouth daily before breakfast. Pt gets brand name      . Multiple Vitamin (MULTIVITAMIN) tablet Take 1 tablet by mouth daily.      . NON FORMULARY Take 1 capsule by mouth daily at 3  pm. MEGA RED CAP      . pregabalin (LYRICA) 75 MG capsule Take 75 mg by mouth 2 (two) times daily.      Marland Kitchen pyridOXINE (VITAMIN B-6) 100 MG tablet Take 100 mg by mouth every morning.       . rosuvastatin (CRESTOR) 20 MG tablet Take 20 mg by mouth at bedtime.       . vitamin B-12 (CYANOCOBALAMIN) 1000 MCG tablet Take 1,000 mcg by mouth every evening.        No current facility-administered medications for this visit.    No Known Allergies  History   Social History  . Marital Status: Married    Spouse Name: N/A    Number of Children: N/A  . Years of Education: N/A   Occupational History  . Full Time    Social History Main Topics  . Smoking status: Never Smoker   . Smokeless tobacco: Never Used  . Alcohol Use: Yes     Comment: once a week 1 wine cooler  . Drug Use: No  . Sexual Activity: Not on file   Other Topics Concern  . Not on  file   Social History Narrative   She is active. [place] acres with 20 chickens, 8 dogs, and 4 cats as well as her husband.     Review of Systems: General: negative for chills, fever, night sweats or weight changes.  Cardiovascular: negative for chest pain, dyspnea on exertion, edema, orthopnea, palpitations, paroxysmal nocturnal dyspnea or shortness of breath Dermatological: negative for rash Respiratory: negative for cough or wheezing Urologic: negative for hematuria Abdominal: negative for nausea, vomiting, diarrhea, bright red blood per rectum, melena, or hematemesis Neurologic: negative for visual changes, syncope, or dizziness All other systems reviewed and are otherwise negative except as noted above.    Blood pressure 122/72, pulse 78, height  (1.702 m), weight 237 lb (107.502 kg).  General appearance: alert, cooperative, no distress and moderately obese Neck: no carotid bruit and no JVD Lungs: clear to auscultation bilaterally Heart: regular rate and rhythm Extremities: no edema Skin: Skin color, texture, turgor normal. No  rashes or lesions Neurologic: Grossly normal  EKG NSR, poor anterior RW  ASSESSMENT AND PLAN:   Pre-operative clearance Discussed with Dr Jens Som, low cardiovascular risk for bariatric surgery.  CAD 50-60% RCA Dec 2010  HYPERTENSION, BENIGN ESSENTIAL Controlled  HYPERLIPIDEMIA-MIXED On statin  Obesity (BMI 30-39.9) Needs bariatric surgery  H/O laparoscopic adjustable gastric banding 05/22/2012 Complicated by post op dehydration and acute renal insuficency  S/p nephrectomy- Lt 2011  Renal cell cancer by her history    PLAN  She is active. Lives on 2 acres with 20 chickens, 8 dogs, and 4 cats as well as her husband. No angina. OK for surgery, f/u lipids and LFTs with PMD.   Corine Shelter KPA-C 02/12/2014 2:35 PM

## 2014-02-12 NOTE — Assessment & Plan Note (Signed)
50-60% RCA Dec 2010

## 2014-02-12 NOTE — Assessment & Plan Note (Signed)
Renal cell cancer by her history

## 2014-02-12 NOTE — Assessment & Plan Note (Signed)
On statin.

## 2014-02-12 NOTE — Assessment & Plan Note (Signed)
Needs bariatric surgery

## 2014-02-12 NOTE — Assessment & Plan Note (Signed)
Complicated by post op dehydration and acute renal insuficency

## 2014-02-12 NOTE — Assessment & Plan Note (Signed)
Controlled.  

## 2014-02-12 NOTE — Assessment & Plan Note (Signed)
Discussed with Dr Jens Som, low cardiovascular risk for bariatric surgery.

## 2014-02-20 ENCOUNTER — Encounter: Payer: Medicare Other | Attending: General Surgery | Admitting: Dietician

## 2014-02-20 ENCOUNTER — Encounter: Payer: Self-pay | Admitting: Dietician

## 2014-02-20 VITALS — Ht 67.0 in | Wt 235.7 lb

## 2014-02-20 DIAGNOSIS — E669 Obesity, unspecified: Secondary | ICD-10-CM | POA: Diagnosis present

## 2014-02-20 DIAGNOSIS — Z6836 Body mass index (BMI) 36.0-36.9, adult: Secondary | ICD-10-CM | POA: Insufficient documentation

## 2014-02-20 DIAGNOSIS — Z713 Dietary counseling and surveillance: Secondary | ICD-10-CM | POA: Insufficient documentation

## 2014-02-20 DIAGNOSIS — Z01818 Encounter for other preprocedural examination: Secondary | ICD-10-CM | POA: Insufficient documentation

## 2014-02-20 NOTE — Patient Instructions (Signed)
Follow Pre-Op Goals Try Protein Shakes Call [facility] at [phone] when surgery is scheduled to enroll in Pre-Op Class 

## 2014-02-20 NOTE — Progress Notes (Signed)
  Pre-Op Assessment Visit:  Pre-Operative Sleeve Gastrectomy Surgery  Medical Nutrition Therapy:  Appt start time: 0830   End time:  0900.  Patient was seen on 02/20/2014 for Pre-Operative Sleeve Gastrectomy Nutrition Assessment. Assessment and letter of approval faxed to [facility] Bariatric Surgery Program coordinator on 02/20/2014.   Preferred Learning Style:   No preference indicated   Learning Readiness:   Ready  Handouts given during visit include:  Pre-Op Goals Bariatric Surgery Protein Shakes  Teaching Method Utilized:  Visual Auditory Hands on  Barriers to learning/adherence to lifestyle change: none  Demonstrated degree of understanding via:  Teach Back   [facility] to [facility] to enroll in Pre-Op and Post-Op Nutrition Education when surgery date is scheduled.

## 2014-04-08 ENCOUNTER — Encounter: Payer: Medicare Other | Attending: General Surgery | Admitting: Dietician

## 2014-04-08 VITALS — Ht 67.0 in | Wt 235.6 lb

## 2014-04-08 DIAGNOSIS — E669 Obesity, unspecified: Secondary | ICD-10-CM | POA: Insufficient documentation

## 2014-04-08 DIAGNOSIS — Z713 Dietary counseling and surveillance: Secondary | ICD-10-CM | POA: Diagnosis not present

## 2014-04-08 DIAGNOSIS — Z6841 Body Mass Index (BMI) 40.0 and over, adult: Secondary | ICD-10-CM | POA: Insufficient documentation

## 2014-04-08 NOTE — Patient Instructions (Addendum)
Continue walking every day. Try Romaine lettuce. Practice chewing food 20-30 per bite. Contact Sherion to see if you need to do more supervised weight loss appointments.

## 2014-04-08 NOTE — Progress Notes (Signed)
  6 Months Supervised Weight Loss Visit:   Pre-Operative Sleeve Gastrectomy Surgery  Medical Nutrition Therapy:  Appt start time: 1545 end time:  1600.  Primary concerns today: Supervised Weight Loss Visit #1. Marylu LundJanet has a lap band, though has had the fluid removed in August. Still has trouble keeping some food down. Able to eat more than she used to be able to. Was vomiting a lot with the LAGB.   Does not like to eat a lot of meat, though does eat chicken and tuna.   Has knee and back problems.   Weight: 235.6 lbs BMI: 37.0  Preferred Learning Style:   No preference indicated   Learning Readiness:   Ready  24-hr recall: B (AM): yogurt Snk (AM): protein shake (premier)  L (PM): cottage cheese with fruit or chicken Snk (PM): protein shake  D (PM): vegetable with chicken or tuna  Snk (PM): none  Beverages: water/flavored water and protein shakes  Medications: see list   Recent physical activity:  Walks about 1 mile (not sure how long it takes)  Progress Towards Goal(s):  In progress.  Handouts given during visit include:  Pre-Op Goals  Protein Shakes   Nutritional Diagnosis:  [place]-3.3 Overweight/obesity related to past poor dietary habits and physical inactivity as evidenced by patient w/ recent sleeve gastrectomy surgery following dietary guidelines for continued weight loss.    Intervention:  Nutrition counseling provided. Plan: Continue walking every day. Try Romaine lettuce. Practice chewing food 20-30 per bite. Contact Sherion to see if you need to do more supervised weight loss appointments.   Teaching Method Utilized:  Visual Auditory Hands on  Barriers to learning/adherence to lifestyle change: none  Demonstrated degree of understanding via:  Teach Back   Monitoring/Evaluation:  Dietary intake, exercise, and body weight. Follow up in 1 months for 6 month supervised weight loss visit.

## 2014-05-07 ENCOUNTER — Encounter: Payer: Medicare Other | Attending: General Surgery | Admitting: Dietician

## 2014-05-07 VITALS — Ht 67.0 in | Wt 232.8 lb

## 2014-05-07 DIAGNOSIS — Z6841 Body Mass Index (BMI) 40.0 and over, adult: Secondary | ICD-10-CM | POA: Insufficient documentation

## 2014-05-07 DIAGNOSIS — Z713 Dietary counseling and surveillance: Secondary | ICD-10-CM | POA: Insufficient documentation

## 2014-05-07 DIAGNOSIS — E669 Obesity, unspecified: Secondary | ICD-10-CM

## 2014-05-07 NOTE — Progress Notes (Signed)
  6 Months Supervised Weight Loss Visit:   Pre-Operative Sleeve Gastrectomy Surgery  Medical Nutrition Therapy:  Appt start time: 200 end time:  215  Primary concerns today: Supervised Weight Loss Visit #2.  [name] returns having lost 3 lbs since last month. Still having a lot of vomiting, although she doesn't have any fluid in her band. Has been able to tolerate: oatmeal, [place] yogurt, kale, Premier protein shakes, water. Vomiting with eggs and bread.    Weight: 232.8 lbs BMI: 36.5  Preferred Learning Style:   No preference indicated   Learning Readiness:   Ready  24-hr recall: B (AM): yogurt Snk (AM): protein shake (premier)  L (PM): cottage cheese with fruit or chicken Snk (PM): protein shake  D (PM): vegetable with chicken or tuna  Snk (PM): none  Beverages: water/flavored water and protein shakes  Medications: see list   Recent physical activity:  Walks about 1 mile (not sure how long it takes)  Progress Towards Goal(s):  In progress.  Handouts given during visit include:  Bariatric Holiday Eating   Nutritional Diagnosis:  [place]-3.3 Overweight/obesity related to past poor dietary habits and physical inactivity as evidenced by patient w/ recent sleeve gastrectomy surgery following dietary guidelines for continued weight loss.    Intervention:  Nutrition counseling provided. Avoid skipping meals: have a protein food every 3-5 hours (eggs, yogurt, protein shake, light cheese) Practice chewing thoroughly  Teaching Method Utilized:  Visual Auditory  Barriers to learning/adherence to lifestyle change: none  Demonstrated degree of understanding via:  Teach Back   Monitoring/Evaluation:  Dietary intake, exercise, and body weight. Follow up in 1 months for 6 month supervised weight loss visit.

## 2014-05-07 NOTE — Patient Instructions (Addendum)
Continue walking every day. Practice chewing food 20-30 per bite.  Avoid skipping meals: have a protein food every 3-5 hours (eggs, yogurt, protein shake, light cheese)

## 2014-05-20 ENCOUNTER — Encounter: Payer: Medicare Other | Attending: General Surgery

## 2014-05-20 VITALS — Ht 63.0 in | Wt 239.5 lb

## 2014-05-20 DIAGNOSIS — Z713 Dietary counseling and surveillance: Secondary | ICD-10-CM | POA: Insufficient documentation

## 2014-05-20 DIAGNOSIS — E669 Obesity, unspecified: Secondary | ICD-10-CM | POA: Diagnosis not present

## 2014-05-20 DIAGNOSIS — Z6841 Body Mass Index (BMI) 40.0 and over, adult: Secondary | ICD-10-CM | POA: Diagnosis not present

## 2014-05-20 NOTE — Patient Instructions (Signed)
Follow:   Pre-Op Diet per MD 2 weeks prior to surgery  Phase 2- Liquids (clear/full) 2 weeks after surgery  Vitamin/Mineral/Calcium guidelines for purchasing bariatric supplements  Exercise guidelines pre and post-op per MD  Follow-up at [facility] in 2 weeks post-op for diet advancement. Contact Leslie Williams or Liz Priyana Mccarey as needed with questions/concerns. 

## 2014-05-20 NOTE — Progress Notes (Signed)
  Pre-Operative Nutrition Class:  Appt start time: 3403   End time:  1830.  Patient was seen on 05/20/2014 for Pre-Operative Bariatric Surgery Education at [facility].   Surgery date: 06/04/2014 Surgery type: Sleeve Start weight at [facility]: 235 lbs on 02/20/2014 Weight today: 239.5  TANITA  BODY COMP RESULTS  05/20/14   BMI (kg/m^2) 42.4   Fat Mass (lbs) 126.5   Fat Free Mass (lbs) 113.0   Total Body Water (lbs) 82.5   Samples given per MNT protocol. Patient educated on appropriate usage: Chocolate PB2 Lot #none Exp:04/26/2015  Celebrate Vitamins Multivitamin Grape Lot #[id number] Exp: 06/2014  Bariactiv Calcium Citrate Lot #[id number] Exp:6/16  Rebecka Apley Protein Powder Lot #[id number] Exp: 03/2015  Strawberry Premier Protein Lot #[id number] Exp: 02/02/2015  The following the learning objectives were met by the patient during this course:  Identify Pre-Op Dietary Goals and will begin 2 weeks pre-operatively  Identify appropriate sources of fluids and proteins   State protein recommendations and appropriate sources pre and post-operatively  Identify Post-Operative Dietary Goals and will follow for 2 weeks post-operatively  Identify appropriate multivitamin and calcium sources  Describe the need for physical activity post-operatively and will follow MD recommendations  State when to call healthcare provider regarding medication questions or post-operative complications  Handouts given during class include:  Pre-Op Bariatric Surgery Diet Handout  Protein Shake Handout  Post-Op Bariatric Surgery Nutrition Handout  BELT Program Information Flyer  Support Group Information Flyer  [facility] Bariatric Supplements Price List  Follow-Up Plan: Patient will follow-up at [facility] 2 weeks post operatively for diet advancement per MD.

## 2014-05-23 NOTE — Progress Notes (Signed)
Please put orders in Epic surgery 06-04-14 pre op 05-30-14 Thanks

## 2014-05-24 ENCOUNTER — Ambulatory Visit (INDEPENDENT_AMBULATORY_CARE_PROVIDER_SITE_OTHER): Payer: Self-pay | Admitting: General Surgery

## 2014-05-28 ENCOUNTER — Ambulatory Visit (INDEPENDENT_AMBULATORY_CARE_PROVIDER_SITE_OTHER): Payer: Self-pay | Admitting: General Surgery

## 2014-05-28 NOTE — H&P (Signed)
[name] 05/16/2014 2:30 PM Location: [place] [facility] Patient #: [id number] DOB: 1945/10/31 Married / Language: English / Race: White Female  History of Present Illness Randall Hiss M. Alycia Cooperwood MD; 05/28/2014 10:47 PM) Patient words: discuss sx.  The patient is a 68 year old female who presents for a pre-op visit. She is currently scheduled to undergo removal of her lapband with conversion to Sleeve Gastrectomy later in the month. She underwent placement of a laparoscopic adjustable gastric band in December 2013. Over the past year or so she has been having ongoing issues with satisfactory weight loss. We have been struggling to find the correct amount of fluid to have in her band. She is also using correct eating techniques and behaviors as well is eating correct foods but is still not losing adequate amounts of weight. We have met on several prior occasions to discuss revisional surgery.  Most recently with respect to her band. She states that she has been unable to keep solid food down without it coming back up. She is able to keep liquids down without a problem. She denies any nighttime cough or reflux. Any type of meats, salad, or green beans she will regurgitate despite using proper eating techniques. She is walking and swimming on a regular basis  Her UGI in Jan showed normal LapBand position. Her CXR from a short time ago also showed the lapband in normal orientation. She has recieved cardiac clearance.  In the past she has undergone a prior laparoscopic ventral hernia repair with goretex mesh in 2004. During her LapBand surgery we took down omentum for about 30 minutes from her abdominal wall.   Other Problems Gayland Curry, MD; 05/28/2014 10:51 PM) LAP-BAND SURGERY STATUS (V45.86  Z98.84) OBESITY (BMI 30-39.9) (278.00  E66.9) HYPOTHYROIDISM, ADULT (244.9  E03.9) CORONARY ARTERY DISEASE WITHOUT ANGINA PECTORIS, UNSPECIFIED VESSEL OR LESION TYPE, UNSPECIFIED WHETHER NATIVE  OR TRANSPLANTED HEART (414.00  I25.10) HTN (HYPERTENSION), BENIGN (401.1  I10)  Past Surgical History Gayland Curry, MD; 05/28/2014 10:49 PM) Cholecystectomy Appendectomy Hysterectomy Ventral Hernia [phone] with Goretex mesh 15x19cm upper abd Nephrectomy left  Allergies Briant Cedar, CMA; 05/16/2014 2:34 PM) No Known Drug Allergies12/08/2013  Medication History Gayland Curry, MD; 05/28/2014 10:51 PM) Norvasc (5MG Tablet, Oral) Active. Baby Aspirin (81MG Tablet Chewable, Oral) Active. BuPROPion HCl (75MG Tablet, Oral) Active. Calcium Carbonate (600MG Tablet, Oral) Active. Cholecalciferol (10000UNIT Capsule, Oral) Active. Caltrate 600+D Plus (600-400MG-UNIT Tablet Chewable, Oral) Active. ClonazePAM ODT (2MG Tablet Disperse, Oral) Active. Dextromethorphan-GG (30-600MG Tablet ER, Oral) Active. Synthroid (75MCG Tablet, Oral) Active. Lyrica (75MG Capsule, Oral) Active. Crestor (20MG Tablet, Oral) Active. Vitamin B 12 (100MCG Lozenge, Oral) Active. OxyCODONE HCl (5MG/5ML Solution, 5-10 Milliliter Oral every four hours, as needed, Taken starting 05/16/2014) Active.  Vitals Briant Cedar CMA; 05/16/2014 2:40 PM) 05/16/2014 2:39 PM Weight: 237.38 lb Height: 67in Body Surface Area: 2.26 m Body Mass Index: 37.18 kg/m Temp.: 98.14F  Pulse: 89 (Regular)  BP: 140/98 (Sitting, Left Arm, Standard)    Physical Exam Randall Hiss M. Karielle Davidow MD; 05/28/2014 10:31 PM) General Mental Status-Alert. General Appearance-Consistent with stated age. Hydration-Well hydrated. Voice-Normal.  Head and Neck Head-normocephalic, atraumatic with no lesions or palpable masses. Trachea-midline. Thyroid Gland Characteristics - normal size and consistency.  Eye Eyeball - Bilateral-Extraocular movements intact. Sclera/Conjunctiva - Bilateral-No scleral icterus.  Chest and Lung Exam Chest and lung exam reveals -quiet, even and easy respiratory effort  with no use of accessory muscles and on auscultation, normal breath sounds, no adventitious sounds and normal  vocal resonance. Inspection Chest Wall - Normal. Back - normal.  Breast - Did not examine.  Cardiovascular Cardiovascular examination reveals -normal heart sounds, regular rate and rhythm with no murmurs and normal pedal pulses bilaterally.  Abdomen Inspection Inspection of the abdomen reveals - No Hernias. Palpation/Percussion Palpation and Percussion of the abdomen reveal - Soft, Non Tender, No Rebound tenderness, No Rigidity (guarding) and No hepatosplenomegaly. Auscultation Auscultation of the abdomen reveals - Bowel sounds normal. Note: WELL HEALED TROCAR SCARS. PALPABLE RIGHT MID ABDOMEN SUBCU PORT.   Peripheral Vascular Upper Extremity Palpation - Pulses bilaterally normal.  Neurologic Neurologic evaluation reveals -alert and oriented x 3 with no impairment of recent or remote memory. Mental Status-Normal.  Neuropsychiatric The patient's mood and affect are described as -normal. Judgment and Insight-insight is appropriate concerning matters relevant to self.  Musculoskeletal Normal Exam - Left-Upper Extremity Strength Normal and Lower Extremity Strength Normal. Normal Exam - Right-Upper Extremity Strength Normal and Lower Extremity Strength Normal.  Lymphatic Head & Neck  General Head & Neck Lymphatics: Bilateral - Description - Normal. Axillary - Did not examine. Femoral & Inguinal - Did not examine.    Results Randall Hiss M. Jashaun Penrose MD; 05/28/2014 10:51 PM) Procedures  Name Value Date Lap Band Adjustment [facility]) [ Lap Band Adjustment ] Procedure Procedure: Informed verbal consent was obtained for adjustment of the band. The port site was positively identified and prepped with isopropyl alcohol. The port was accessed with a 20G Huber needle and clear fluid was aspirated. 3 mL saline was XXX the port to give a predicted volume of 0 mL. A  sterile bandage was applied. The patient was able to take sips of water without difficulty. The patient tolerated the procedure well without complication.  Performed: 05/28/2014 10:50 PM   Assessment & Plan Randall Hiss M. Amil Moseman MD; 05/28/2014 10:49 PM) OBESITY (BMI 30-39.9) (278.00  E66.9) Impression: We discussed her workup to date. She appears ready for upcoming revisional surgery. She has watched videos about sleeve gastrectomy and understands that revisional surgery is higher risk than native surgery. We discussed that ideally we will perform her surgery in a single stage procedure but if for some reason there is extensive/excessive trauma to the stomach while removing the lapband where I feel proceeding with same day sleeve gastrectomy would be very risky then we may just do LapBand removal only and do the sleeve at a later date. We also discussed that since she has only 1 kidney we will have to watch her kidney function post discharge. Current Plans  Instructions: Congratulations on starting your journey to a healthier life! Over the next few weeks you will be undergoing tests (x-rays and labs) and seeing specialists to help evaluate you for weight loss surgery. These tests and consultations with a psychologist and nutritionist are needed to prepare you for the lifestyle changes that lie ahead and are often required by insurance companies to approve you for surgery. Please call me if you have any questions during the evaluation.  Pathway to Surgery:  Two weeks prior to surgery Go on the extremely low carb liquid diet - this will decrease the size of your liver which will make surgery safer - the nutritionist will go over this at a later date Attend preoperative appointment with your surgeon Attend preoperative surgery class  One week prior to surgery No aspirin products. Tylenol is acceptable  24 hours prior to surgery No alcoholic beverages Report fever greater than 100.5 or excessive  nasal drainage suggesting infection Continue bariatric  preop diet Perform bowel prep if ordered Do not eat or drink anything after midnight the night before surgery Do not take any medications except those instructed by the anesthesiologist  Morning of surgery Please arrive at the hospital at least 2 hours before your scheduled surgery time. No makeup, fingernail polish or jewelry Bring insurance cards with you Bring your CPAP mask if you use this Started OxyCODONE HCl 5MG/5ML, 5-10 Milliliter every four hours, as needed, 200 Milliliter, 05/16/2014, No Refill.  CORONARY ARTERY DISEASE WITHOUT ANGINA PECTORIS, UNSPECIFIED VESSEL OR LESION TYPE, UNSPECIFIED WHETHER NATIVE OR TRANSPLANTED HEART (414.00  I25.10)  HTN (HYPERTENSION), BENIGN (401.1  I10)  LAP-BAND SURGERY STATUS (V45.86  Z98.84) Impression: Because she has upcoming surgery for removal of lapband with conversion to sleeve gastrectomy but moreover because she is still having issues with solids despite good eating techniques, I believe we need to remove all remaining fluid from her band today. I explained that if she is still symptomatic after her band is emptied then we may need to get a UGI prior to surgery. She voiced understaning. Current Plans Free Text Instructions : discussed with patient and provided information. Lap Band Adjustment (MCR)  HYPOTHYROIDISM, ADULT (244.9  E03.9)  Leighton Ruff. Redmond Pulling, MD, FACS General, Bariatric, & Minimally Invasive Surgery [facility], [place]

## 2014-05-30 ENCOUNTER — Encounter (HOSPITAL_COMMUNITY)
Admission: RE | Admit: 2014-05-30 | Discharge: 2014-05-30 | Disposition: A | Discharge status: Home / Self Care | Payer: Medicare Other | Source: Ambulatory Visit | Attending: General Surgery | Admitting: General Surgery

## 2014-05-30 ENCOUNTER — Encounter (HOSPITAL_COMMUNITY): Payer: Self-pay

## 2014-05-30 DIAGNOSIS — Z4651 Encounter for fitting and adjustment of gastric lap band: Secondary | ICD-10-CM | POA: Insufficient documentation

## 2014-05-30 DIAGNOSIS — Z01812 Encounter for preprocedural laboratory examination: Secondary | ICD-10-CM | POA: Diagnosis not present

## 2014-05-30 DIAGNOSIS — Z7982 Long term (current) use of aspirin: Secondary | ICD-10-CM | POA: Insufficient documentation

## 2014-05-30 HISTORY — DX: Other specified disorders of nose and nasal sinuses: J34.89

## 2014-05-30 LAB — COMPREHENSIVE METABOLIC PANEL
ALT: 26 U/L (ref 0–35)
AST: 25 U/L (ref 0–37)
Albumin: 4 g/dL (ref 3.5–5.2)
Alkaline Phosphatase: 107 U/L (ref 39–117)
Anion gap: 11 (ref 5–15)
BUN: 27 mg/dL — ABNORMAL HIGH (ref 6–23)
CALCIUM: 10.3 mg/dL (ref 8.4–10.5)
CO2: 26 mEq/L (ref 19–32)
Chloride: 102 mEq/L (ref 96–112)
Creatinine, Ser: 1.31 mg/dL — ABNORMAL HIGH (ref 0.50–1.10)
GFR calc Af Amer: 48 mL/min — ABNORMAL LOW (ref >90)
GFR calc non Af Amer: 41 mL/min — ABNORMAL LOW (ref >90)
Glucose, Bld: 88 mg/dL (ref 70–99)
POTASSIUM: 4.7 meq/L (ref 3.7–5.3)
SODIUM: 139 meq/L (ref 137–147)
TOTAL PROTEIN: 7.6 g/dL (ref 6.0–8.3)
Total Bilirubin: 0.4 mg/dL (ref 0.3–1.2)

## 2014-05-30 LAB — CBC WITH DIFFERENTIAL/PLATELET
Basophils Absolute: 0 10*3/uL (ref 0.0–0.1)
Basophils Relative: 0 % (ref 0–1)
EOS ABS: 0.2 10*3/uL (ref 0.0–0.7)
EOS PCT: 3 % (ref 0–5)
HCT: 44.6 % (ref 36.0–46.0)
Hemoglobin: 14 g/dL (ref 12.0–15.0)
LYMPHS PCT: 31 % (ref 12–46)
Lymphs Abs: 2.2 10*3/uL (ref 0.7–4.0)
MCH: 28.9 pg (ref 26.0–34.0)
MCHC: 31.4 g/dL (ref 30.0–36.0)
MCV: 92.1 fL (ref 78.0–100.0)
Monocytes Absolute: 0.6 10*3/uL (ref 0.1–1.0)
Monocytes Relative: 9 % (ref 3–12)
NEUTROS PCT: 57 % (ref 43–77)
Neutro Abs: 4.1 10*3/uL (ref 1.7–7.7)
PLATELETS: 245 10*3/uL (ref 150–400)
RBC: 4.84 MIL/uL (ref 3.87–5.11)
RDW: 13.6 % (ref 11.5–15.5)
WBC: 7.2 10*3/uL (ref 4.0–10.5)

## 2014-05-30 NOTE — Pre-Procedure Instructions (Signed)
05-30-14 EKG/ CXR 8'15 Epic.

## 2014-05-30 NOTE — Patient Instructions (Signed)
20 [name]  05/30/2014   Your procedure is scheduled on:   06-04-2014 Tuesday  [facility] and [facility]. Arrive at  1000      AM..  Call this number if you have problems the morning of surgery: [phone]  Or Presurgical Testing [phone].   For Living Will and/or Health Care Power Attorney Forms: please provide copy for your medical record,may bring AM of surgery(Forms should be already notarized -we do not provide this service).(05-30-14 Yes/ will bring copy of document AM of 06-04-14).  Remember: Follow any bowel prep instructions per MD office.    Do not eat food/ or drink: After Midnight.    Take these medicines the morning of surgery with A SIP OF WATER: Cymbalta. Levothyroxine. Lyrica. Amlodipine. Wellbutrin.   Do not wear jewelry, make-up or nail polish.  Do not wear deodorant, lotions, powders, or perfumes.   Do not shave legs and under arms- 48 hours(2 days) prior to first CHG shower.(Shaving face and neck okay.)  Do not bring valuables to the hospital.(Hospital is not responsible for lost valuables).  Contacts, dentures or removable bridgework, body piercing, hair pins may not be worn into surgery.  Leave suitcase in the car. After surgery it may be brought to your room.  For patients admitted to the hospital, checkout time is 11:00 AM the day of discharge.(Restricted visitors-Any Persons displaying flu-like symptoms or illness).    Patients discharged the day of surgery will not be allowed to drive home. Must have responsible person with you x 24 hours once discharged.  Name and phone number of your driver: [name], spouse [phone] cell     Please read over the following fact sheets that you were given:  CHG(Chlorhexidine Gluconate 4% Surgical Soap) use, MRSA Information, Blood Transfusion fact sheet, Incentive Spirometry Instruction.  Remember : Type/Screen "Blue armbands" - may not be  removed once applied(would result in being retested AM of surgery, if removed).         [facility] - Preparing for Surgery Before surgery, you can play an important role.  Because skin is not sterile, your skin needs to be as free of germs as possible.  You can reduce the number of germs on your skin by washing with CHG (chlorahexidine gluconate) soap before surgery.  CHG is an antiseptic cleaner which kills germs and bonds with the skin to continue killing germs even after washing. Please DO NOT use if you have an allergy to CHG or antibacterial soaps.  If your skin becomes reddened/irritated stop using the CHG and inform your nurse when you arrive at Short Stay. Do not shave (including legs and underarms) for at least 48 hours prior to the first CHG shower.  You may shave your face/neck. Please follow these instructions carefully:  1.  Shower with CHG Soap the night before surgery and the  morning of Surgery.  2.  If you choose to wash your hair, wash your hair first as usual with your  normal  shampoo.  3.  After you shampoo, rinse your hair and body thoroughly to remove the  shampoo.                           4.  Use CHG as you would any other liquid soap.  You can apply chg directly  to the skin and wash  Gently with a scrungie or clean washcloth.  5.  Apply the CHG Soap to your body ONLY FROM THE NECK DOWN.   Do not use on face/ open                           Wound or open sores. Avoid contact with eyes, ears mouth and genitals (private parts).                       Wash face,  Genitals (private parts) with your normal soap.             6.  Wash thoroughly, paying special attention to the area where your surgery  will be performed.  7.  Thoroughly rinse your body with warm water from the neck down.  8.  DO NOT shower/wash with your normal soap after using and rinsing off  the CHG Soap.                9.  Pat yourself dry with a clean towel.            10.  Wear clean  pajamas.            11.  Place clean sheets on your bed the night of your first shower and do not  sleep with pets. Day of Surgery : Do not apply any lotions/deodorants the morning of surgery.  Please wear clean clothes to the hospital/surgery center.  FAILURE TO FOLLOW THESE INSTRUCTIONS MAY RESULT IN THE CANCELLATION OF YOUR SURGERY PATIENT SIGNATURE_________________________________  NURSE SIGNATURE__________________________________  ________________________________________________________________________

## 2014-06-04 ENCOUNTER — Encounter (HOSPITAL_COMMUNITY)
Admission: RE | Disposition: A | Discharge status: Home / Self Care | Payer: Self-pay | Source: Ambulatory Visit | Attending: General Surgery

## 2014-06-04 ENCOUNTER — Inpatient Hospital Stay (HOSPITAL_COMMUNITY)
Admission: RE | Admit: 2014-06-04 | Discharge: 2014-06-06 | DRG: 621 | Disposition: A | Discharge status: Home / Self Care | Payer: Medicare Other | Source: Ambulatory Visit | Attending: General Surgery | Admitting: General Surgery

## 2014-06-04 ENCOUNTER — Other Ambulatory Visit: Payer: Self-pay

## 2014-06-04 ENCOUNTER — Inpatient Hospital Stay (HOSPITAL_COMMUNITY): Payer: Medicare Other | Admitting: Anesthesiology

## 2014-06-04 ENCOUNTER — Encounter (HOSPITAL_COMMUNITY): Payer: Self-pay | Admitting: *Deleted

## 2014-06-04 DIAGNOSIS — K660 Peritoneal adhesions (postprocedural) (postinfection): Secondary | ICD-10-CM | POA: Diagnosis present

## 2014-06-04 DIAGNOSIS — E039 Hypothyroidism, unspecified: Secondary | ICD-10-CM | POA: Diagnosis present

## 2014-06-04 DIAGNOSIS — E785 Hyperlipidemia, unspecified: Secondary | ICD-10-CM | POA: Diagnosis present

## 2014-06-04 DIAGNOSIS — Z6837 Body mass index (BMI) 37.0-37.9, adult: Secondary | ICD-10-CM | POA: Diagnosis not present

## 2014-06-04 DIAGNOSIS — Z9884 Bariatric surgery status: Secondary | ICD-10-CM

## 2014-06-04 DIAGNOSIS — E118 Type 2 diabetes mellitus with unspecified complications: Secondary | ICD-10-CM

## 2014-06-04 DIAGNOSIS — N189 Chronic kidney disease, unspecified: Secondary | ICD-10-CM | POA: Diagnosis present

## 2014-06-04 DIAGNOSIS — I2510 Atherosclerotic heart disease of native coronary artery without angina pectoris: Secondary | ICD-10-CM | POA: Diagnosis present

## 2014-06-04 DIAGNOSIS — I959 Hypotension, unspecified: Secondary | ICD-10-CM | POA: Diagnosis not present

## 2014-06-04 DIAGNOSIS — I129 Hypertensive chronic kidney disease with stage 1 through stage 4 chronic kidney disease, or unspecified chronic kidney disease: Secondary | ICD-10-CM | POA: Diagnosis present

## 2014-06-04 DIAGNOSIS — M797 Fibromyalgia: Secondary | ICD-10-CM | POA: Diagnosis present

## 2014-06-04 DIAGNOSIS — R579 Shock, unspecified: Secondary | ICD-10-CM

## 2014-06-04 DIAGNOSIS — Z905 Acquired absence of kidney: Secondary | ICD-10-CM | POA: Diagnosis present

## 2014-06-04 DIAGNOSIS — I517 Cardiomegaly: Secondary | ICD-10-CM

## 2014-06-04 DIAGNOSIS — I10 Essential (primary) hypertension: Secondary | ICD-10-CM | POA: Diagnosis present

## 2014-06-04 HISTORY — PX: UPPER GI ENDOSCOPY: SHX6162

## 2014-06-04 HISTORY — PX: LAPAROSCOPIC LYSIS OF ADHESIONS: SHX5905

## 2014-06-04 HISTORY — PX: LAPAROSCOPIC GASTRIC BAND REMOVAL WITH LAPAROSCOPIC GASTRIC SLEEVE RESECTION: SHX6498

## 2014-06-04 HISTORY — DX: Renal agenesis, unilateral: Q60.0

## 2014-06-04 HISTORY — DX: Reserved for concepts with insufficient information to code with codable children: IMO0002

## 2014-06-04 LAB — CREATININE, SERUM
CREATININE: 1.38 mg/dL — AB (ref 0.50–1.10)
GFR calc Af Amer: 45 mL/min — ABNORMAL LOW (ref >90)
GFR calc non Af Amer: 39 mL/min — ABNORMAL LOW (ref >90)

## 2014-06-04 LAB — BASIC METABOLIC PANEL
Anion gap: 9 (ref 5–15)
BUN: 25 mg/dL — ABNORMAL HIGH (ref 6–23)
CALCIUM: 9.7 mg/dL (ref 8.4–10.5)
CO2: 25 mmol/L (ref 19–32)
CREATININE: 1.54 mg/dL — AB (ref 0.50–1.10)
Chloride: 105 mEq/L (ref 96–112)
GFR calc Af Amer: 39 mL/min — ABNORMAL LOW (ref >90)
GFR calc non Af Amer: 34 mL/min — ABNORMAL LOW (ref >90)
GLUCOSE: 173 mg/dL — AB (ref 70–99)
Potassium: 3.8 mmol/L (ref 3.5–5.1)
Sodium: 139 mmol/L (ref 135–145)

## 2014-06-04 LAB — TROPONIN I
Troponin I: <0.03 ng/mL (ref <0.031)
Troponin I: <0.03 ng/mL (ref <0.031)

## 2014-06-04 LAB — HEMOGLOBIN AND HEMATOCRIT, BLOOD
HCT: 39.2 % (ref 36.0–46.0)
HEMOGLOBIN: 13 g/dL (ref 12.0–15.0)

## 2014-06-04 LAB — CK TOTAL AND CKMB (NOT AT ARMC)
CK, MB: 2.8 ng/mL (ref 0.3–4.0)
RELATIVE INDEX: INVALID (ref 0.0–2.5)
Total CK: 87 U/L (ref 7–177)

## 2014-06-04 LAB — TSH: TSH: 7.552 u[IU]/mL — ABNORMAL HIGH (ref 0.350–4.500)

## 2014-06-04 LAB — MRSA PCR SCREENING: MRSA by PCR: NEGATIVE

## 2014-06-04 SURGERY — LAPAROSCOPIC GASTRIC BAND REMOVAL WITH LAPAROSCOPIC GASTRIC SLEEVE RESECTION
Anesthesia: General

## 2014-06-04 MED ORDER — SUCCINYLCHOLINE CHLORIDE 20 MG/ML IJ SOLN
INTRAMUSCULAR | Status: DC | PRN
  Administered 2014-06-04: 100 mg via INTRAVENOUS

## 2014-06-04 MED ORDER — EPINEPHRINE HCL 0.1 MG/ML IJ SOSY
PREFILLED_SYRINGE | INTRAMUSCULAR | Status: AC
  Filled 2014-06-04: qty 10

## 2014-06-04 MED ORDER — CHLORHEXIDINE GLUCONATE 4 % EX LIQD: 60.0000 mL | Freq: Once | CUTANEOUS | Status: DC

## 2014-06-04 MED ORDER — SODIUM CHLORIDE 0.9 % IV BOLUS (SEPSIS)
1000.0000 mL | Freq: Once | INTRAVENOUS | Status: AC
  Administered 2014-06-04: 1000 mL via INTRAVENOUS

## 2014-06-04 MED ORDER — HEPARIN SODIUM (PORCINE) 5000 UNIT/ML IJ SOLN
5000.0000 [IU] | Freq: Three times a day (TID) | INTRAMUSCULAR | Status: DC
  Administered 2014-06-05 – 2014-06-06 (×4): 5000 [IU] via SUBCUTANEOUS
  Filled 2014-06-04 (×6): qty 1

## 2014-06-04 MED ORDER — LACTATED RINGERS IV SOLN
INTRAVENOUS | Status: DC | PRN
  Administered 2014-06-04: 09:00:00 via INTRAVENOUS

## 2014-06-04 MED ORDER — CALCIUM CHLORIDE 10 % IV SOLN
INTRAVENOUS | Status: AC
  Filled 2014-06-04: qty 10

## 2014-06-04 MED ORDER — HYDROCORTISONE NA SUCCINATE PF 100 MG IJ SOLR
INTRAMUSCULAR | Status: DC | PRN
  Administered 2014-06-04: 100 mg via INTRAVENOUS

## 2014-06-04 MED ORDER — EPHEDRINE SULFATE 50 MG/ML IJ SOLN
INTRAMUSCULAR | Status: DC | PRN
  Administered 2014-06-04 (×3): 10 mg via INTRAVENOUS

## 2014-06-04 MED ORDER — DOPAMINE-DEXTROSE 1.6-5 MG/ML-% IV SOLN: 2.0000 ug/kg/min | INTRAVENOUS | Status: DC

## 2014-06-04 MED ORDER — EPHEDRINE SULFATE 50 MG/ML IJ SOLN
INTRAMUSCULAR | Status: AC
  Filled 2014-06-04: qty 1

## 2014-06-04 MED ORDER — PHENYLEPHRINE 40 MCG/ML (10ML) SYRINGE FOR IV PUSH (FOR BLOOD PRESSURE SUPPORT)
PREFILLED_SYRINGE | INTRAVENOUS | Status: AC
  Filled 2014-06-04: qty 20

## 2014-06-04 MED ORDER — SODIUM CHLORIDE 0.9 % IJ SOLN
INTRAMUSCULAR | Status: AC
  Filled 2014-06-04: qty 50

## 2014-06-04 MED ORDER — PROPOFOL 10 MG/ML IV BOLUS
INTRAVENOUS | Status: AC
  Filled 2014-06-04: qty 20

## 2014-06-04 MED ORDER — SODIUM CHLORIDE 0.9 % IJ SOLN
INTRAMUSCULAR | Status: AC
  Filled 2014-06-04: qty 10

## 2014-06-04 MED ORDER — 0.9 % SODIUM CHLORIDE (POUR BTL) OPTIME
TOPICAL | Status: DC | PRN
  Administered 2014-06-04: 1000 mL

## 2014-06-04 MED ORDER — LIDOCAINE HCL (CARDIAC) 20 MG/ML IV SOLN
INTRAVENOUS | Status: AC
  Filled 2014-06-04: qty 5

## 2014-06-04 MED ORDER — UNJURY CHOCOLATE CLASSIC POWDER: 2.0000 [oz_av] | Freq: Four times a day (QID) | ORAL | Status: DC

## 2014-06-04 MED ORDER — DOPAMINE-DEXTROSE 3.2-5 MG/ML-% IV SOLN
INTRAVENOUS | Status: AC
  Filled 2014-06-04: qty 250

## 2014-06-04 MED ORDER — SCOPOLAMINE 1 MG/3DAYS TD PT72
MEDICATED_PATCH | TRANSDERMAL | Status: DC | PRN
  Administered 2014-06-04: 1 via TRANSDERMAL

## 2014-06-04 MED ORDER — BUPIVACAINE LIPOSOME 1.3 % IJ SUSP
20.0000 mL | Freq: Once | INTRAMUSCULAR | Status: AC
  Administered 2014-06-04: 20 mL
  Filled 2014-06-04: qty 20

## 2014-06-04 MED ORDER — CETYLPYRIDINIUM CHLORIDE 0.05 % MT LIQD
7.0000 mL | Freq: Two times a day (BID) | OROMUCOSAL | Status: DC
  Administered 2014-06-04 – 2014-06-06 (×5): 7 mL via OROMUCOSAL

## 2014-06-04 MED ORDER — ACETAMINOPHEN 160 MG/5ML PO SOLN
650.0000 mg | ORAL | Status: DC | PRN
  Administered 2014-06-06: 650 mg via ORAL
  Filled 2014-06-04: qty 20.3

## 2014-06-04 MED ORDER — CISATRACURIUM BESYLATE (PF) 10 MG/5ML IV SOLN
INTRAVENOUS | Status: DC | PRN
  Administered 2014-06-04: 6 mg via INTRAVENOUS
  Administered 2014-06-04: 4 mg via INTRAVENOUS

## 2014-06-04 MED ORDER — TISSEEL VH 10 ML EX KIT
PACK | CUTANEOUS | Status: AC
  Filled 2014-06-04: qty 1

## 2014-06-04 MED ORDER — LIDOCAINE HCL (CARDIAC) 20 MG/ML IV SOLN
INTRAVENOUS | Status: DC | PRN
  Administered 2014-06-04: 100 mg via INTRAVENOUS

## 2014-06-04 MED ORDER — KCL IN DEXTROSE-NACL 20-5-0.45 MEQ/L-%-% IV SOLN
INTRAVENOUS | Status: DC
  Administered 2014-06-04: 17:00:00 via INTRAVENOUS
  Filled 2014-06-04 (×2): qty 1000

## 2014-06-04 MED ORDER — OXYCODONE HCL 5 MG/5ML PO SOLN: 5.0000 mg | ORAL | Status: DC | PRN

## 2014-06-04 MED ORDER — ONDANSETRON HCL 4 MG/2ML IJ SOLN: 4.0000 mg | INTRAMUSCULAR | Status: DC | PRN

## 2014-06-04 MED ORDER — SCOPOLAMINE 1 MG/3DAYS TD PT72
MEDICATED_PATCH | TRANSDERMAL | Status: AC
  Filled 2014-06-04: qty 1

## 2014-06-04 MED ORDER — CISATRACURIUM BESYLATE 20 MG/10ML IV SOLN
INTRAVENOUS | Status: AC
  Filled 2014-06-04: qty 10

## 2014-06-04 MED ORDER — ATROPINE SULFATE 0.4 MG/ML IJ SOLN
INTRAMUSCULAR | Status: DC | PRN
  Administered 2014-06-04: 0.4 mg via INTRAVENOUS

## 2014-06-04 MED ORDER — DOPAMINE-DEXTROSE 1.6-5 MG/ML-% IV SOLN
INTRAVENOUS | Status: DC | PRN
  Administered 2014-06-04: 6 ug/kg/min via INTRAVENOUS

## 2014-06-04 MED ORDER — BUPIVACAINE-EPINEPHRINE 0.25% -1:200000 IJ SOLN
INTRAMUSCULAR | Status: AC
  Filled 2014-06-04: qty 1

## 2014-06-04 MED ORDER — FENTANYL CITRATE 0.05 MG/ML IJ SOLN: 25.0000 ug | INTRAMUSCULAR | Status: DC | PRN

## 2014-06-04 MED ORDER — UNJURY VANILLA POWDER
2.0000 [oz_av] | Freq: Four times a day (QID) | ORAL | Status: DC
  Administered 2014-06-06: 2 [oz_av] via ORAL

## 2014-06-04 MED ORDER — CALCIUM CHLORIDE 10 % IV SOLN
INTRAVENOUS | Status: DC | PRN
  Administered 2014-06-04: 500 mg via INTRAVENOUS

## 2014-06-04 MED ORDER — UNJURY CHICKEN SOUP POWDER: 2.0000 [oz_av] | Freq: Four times a day (QID) | ORAL | Status: DC

## 2014-06-04 MED ORDER — DEXTROSE 5 % IV SOLN
2.0000 g | Freq: Once | INTRAVENOUS | Status: AC
  Administered 2014-06-04: 2 g via INTRAVENOUS
  Filled 2014-06-04: qty 2

## 2014-06-04 MED ORDER — EPINEPHRINE HCL 1 MG/ML IJ SOLN
INTRAMUSCULAR | Status: AC
  Filled 2014-06-04: qty 1

## 2014-06-04 MED ORDER — LEVOTHYROXINE SODIUM 100 MCG IV SOLR
37.5000 ug | Freq: Every day | INTRAVENOUS | Status: DC
  Administered 2014-06-05 – 2014-06-06 (×2): 37.5 ug via INTRAVENOUS
  Filled 2014-06-04 (×2): qty 5

## 2014-06-04 MED ORDER — NEOSTIGMINE METHYLSULFATE 10 MG/10ML IV SOLN
INTRAVENOUS | Status: DC | PRN
  Administered 2014-06-04: 4 mg via INTRAVENOUS

## 2014-06-04 MED ORDER — EPINEPHRINE HCL 0.1 MG/ML IJ SOSY
PREFILLED_SYRINGE | INTRAMUSCULAR | Status: DC | PRN
  Administered 2014-06-04 (×3): 20 ug via INTRAVENOUS
  Administered 2014-06-04 (×2): 10 ug via INTRAVENOUS
  Administered 2014-06-04: 20 ug via INTRAVENOUS

## 2014-06-04 MED ORDER — DEXTROSE 5 % IV SOLN
2.0000 g | INTRAVENOUS | Status: AC
  Administered 2014-06-04: 2 g via INTRAVENOUS

## 2014-06-04 MED ORDER — LACTATED RINGERS IV SOLN: INTRAVENOUS | Status: DC

## 2014-06-04 MED ORDER — DEXAMETHASONE SODIUM PHOSPHATE 10 MG/ML IJ SOLN
INTRAMUSCULAR | Status: AC
  Filled 2014-06-04: qty 1

## 2014-06-04 MED ORDER — ACETAMINOPHEN 160 MG/5ML PO SOLN: 325.0000 mg | ORAL | Status: DC | PRN

## 2014-06-04 MED ORDER — LACTATED RINGERS IV SOLN
INTRAVENOUS | Status: DC | PRN
  Administered 2014-06-04 (×3): via INTRAVENOUS

## 2014-06-04 MED ORDER — MIDAZOLAM HCL 2 MG/2ML IJ SOLN
INTRAMUSCULAR | Status: AC
  Filled 2014-06-04: qty 2

## 2014-06-04 MED ORDER — GLYCOPYRROLATE 0.2 MG/ML IJ SOLN
INTRAMUSCULAR | Status: DC | PRN
  Administered 2014-06-04: .6 mg via INTRAVENOUS

## 2014-06-04 MED ORDER — SUFENTANIL CITRATE 50 MCG/ML IV SOLN
INTRAVENOUS | Status: DC | PRN
  Administered 2014-06-04: 20 ug via INTRAVENOUS

## 2014-06-04 MED ORDER — MIDAZOLAM HCL 5 MG/5ML IJ SOLN
INTRAMUSCULAR | Status: DC | PRN
  Administered 2014-06-04: 2 mg via INTRAVENOUS

## 2014-06-04 MED ORDER — DEXTROSE 5 % IV SOLN
INTRAVENOUS | Status: AC
  Filled 2014-06-04 (×2): qty 2

## 2014-06-04 MED ORDER — ONDANSETRON HCL 4 MG/2ML IJ SOLN
INTRAMUSCULAR | Status: DC | PRN
  Administered 2014-06-04: 4 mg via INTRAVENOUS

## 2014-06-04 MED ORDER — SUFENTANIL CITRATE 50 MCG/ML IV SOLN
INTRAVENOUS | Status: AC
  Filled 2014-06-04: qty 1

## 2014-06-04 MED ORDER — HEPARIN SODIUM (PORCINE) 5000 UNIT/ML IJ SOLN
5000.0000 [IU] | INTRAMUSCULAR | Status: AC
  Administered 2014-06-04: 5000 [IU] via SUBCUTANEOUS
  Filled 2014-06-04: qty 1

## 2014-06-04 MED ORDER — HYDROCORTISONE NA SUCCINATE PF 100 MG IJ SOLR
INTRAMUSCULAR | Status: AC
  Filled 2014-06-04: qty 2

## 2014-06-04 MED ORDER — MORPHINE SULFATE 2 MG/ML IJ SOLN: 2.0000 mg | INTRAMUSCULAR | Status: DC | PRN

## 2014-06-04 MED ORDER — GLYCOPYRROLATE 0.2 MG/ML IJ SOLN
INTRAMUSCULAR | Status: AC
  Filled 2014-06-04: qty 3

## 2014-06-04 MED ORDER — MEPERIDINE HCL 50 MG/ML IJ SOLN: 6.2500 mg | INTRAMUSCULAR | Status: DC | PRN

## 2014-06-04 MED ORDER — SODIUM CHLORIDE 0.9 % IJ SOLN
INTRAMUSCULAR | Status: DC | PRN
  Administered 2014-06-04: 50 mL via INTRAVENOUS

## 2014-06-04 MED ORDER — DEXAMETHASONE SODIUM PHOSPHATE 10 MG/ML IJ SOLN
INTRAMUSCULAR | Status: DC | PRN
  Administered 2014-06-04: 10 mg via INTRAVENOUS

## 2014-06-04 MED ORDER — CEFOXITIN SODIUM 2 G IV SOLR
INTRAVENOUS | Status: AC
  Filled 2014-06-04: qty 2

## 2014-06-04 MED ORDER — ONDANSETRON HCL 4 MG/2ML IJ SOLN
INTRAMUSCULAR | Status: AC
  Filled 2014-06-04: qty 2

## 2014-06-04 MED ORDER — PANTOPRAZOLE SODIUM 40 MG IV SOLR
40.0000 mg | Freq: Every day | INTRAVENOUS | Status: DC
  Administered 2014-06-04 – 2014-06-05 (×2): 40 mg via INTRAVENOUS
  Filled 2014-06-04 (×3): qty 40

## 2014-06-04 MED ORDER — LACTATED RINGERS IR SOLN
Status: DC | PRN
  Administered 2014-06-04: 1000 mL

## 2014-06-04 MED ORDER — PROMETHAZINE HCL 25 MG/ML IJ SOLN
6.2500 mg | INTRAMUSCULAR | Status: DC | PRN
Start: 2014-06-04 — End: 2014-06-04

## 2014-06-04 MED ORDER — PHENYLEPHRINE HCL 10 MG/ML IJ SOLN
INTRAMUSCULAR | Status: DC | PRN
  Administered 2014-06-04: 80 ug via INTRAVENOUS

## 2014-06-04 MED ORDER — MORPHINE SULFATE 2 MG/ML IJ SOLN
2.0000 mg | INTRAMUSCULAR | Status: DC | PRN
  Administered 2014-06-05: 2 mg via INTRAVENOUS
  Filled 2014-06-04: qty 1

## 2014-06-04 MED ORDER — LACTATED RINGERS IV SOLN
INTRAVENOUS | Status: DC
  Administered 2014-06-04: 11:00:00 via INTRAVENOUS

## 2014-06-04 MED ORDER — PROPOFOL 10 MG/ML IV BOLUS
INTRAVENOUS | Status: DC | PRN
  Administered 2014-06-04: 150 mg via INTRAVENOUS

## 2014-06-04 SURGICAL SUPPLY — 73 items
ADH SKN CLS APL DERMABOND .7 (GAUZE/BANDAGES/DRESSINGS) ×2
APL SRG 32X5 SNPLK LF DISP (MISCELLANEOUS)
APPLICATOR COTTON TIP 6IN STRL (MISCELLANEOUS) ×2 IMPLANT
APPLIER CLIP ROT 10 11.4 M/L (STAPLE)
APR CLP MED LRG 11.4X10 (STAPLE)
BAG SPEC RTRVL LRG 6X4 10 (ENDOMECHANICALS)
BLADE SURG SZ11 CARB STEEL (BLADE) ×4 IMPLANT
CABLE HIGH FREQUENCY MONO STRZ (ELECTRODE) ×2 IMPLANT
CHLORAPREP W/TINT 26ML (MISCELLANEOUS) ×8 IMPLANT
CLIP APPLIE ROT 10 11.4 M/L (STAPLE) IMPLANT
DERMABOND ADVANCED (GAUZE/BANDAGES/DRESSINGS) ×2
DERMABOND ADVANCED .7 DNX12 (GAUZE/BANDAGES/DRESSINGS) IMPLANT
DEVICE SUT QUICK LOAD TK 5 (STAPLE) IMPLANT
DEVICE SUT TI-KNOT TK 5X26 (MISCELLANEOUS) IMPLANT
DEVICE SUTURE ENDOST 10MM (ENDOMECHANICALS) IMPLANT
DEVICE TI KNOT TK5 (MISCELLANEOUS)
DEVICE TROCAR PUNCTURE CLOSURE (ENDOMECHANICALS) ×2 IMPLANT
DISSECTOR BLUNT TIP ENDO 5MM (MISCELLANEOUS) IMPLANT
DRAIN CHANNEL 19F RND (DRAIN) ×2 IMPLANT
DRAPE CAMERA CLOSED 9X96 (DRAPES) ×4 IMPLANT
DRAPE UTILITY XL STRL (DRAPES) ×8 IMPLANT
ELECT REM PT RETURN 9FT ADLT (ELECTROSURGICAL) ×4
ELECTRODE REM PT RTRN 9FT ADLT (ELECTROSURGICAL) ×2 IMPLANT
EVACUATOR DRAINAGE 10X20 100CC (DRAIN) IMPLANT
EVACUATOR SILICONE 100CC (DRAIN) ×4
GAUZE SPONGE 4X4 12PLY STRL (GAUZE/BANDAGES/DRESSINGS) IMPLANT
GLOVE BIOGEL M STRL SZ7.5 (GLOVE) ×4 IMPLANT
GOWN STRL REUS W/TWL XL LVL3 (GOWN DISPOSABLE) ×16 IMPLANT
HOLDER FOLEY CATH W/STRAP (MISCELLANEOUS) ×2 IMPLANT
HOVERMATT SINGLE USE (MISCELLANEOUS) ×4 IMPLANT
KIT BASIN OR (CUSTOM PROCEDURE TRAY) ×4 IMPLANT
LIQUID BAND (GAUZE/BANDAGES/DRESSINGS) IMPLANT
NDL SPNL 22GX3.5 QUINCKE BK (NEEDLE) ×2 IMPLANT
NEEDLE SPNL 22GX3.5 QUINCKE BK (NEEDLE) ×4 IMPLANT
PACK UNIVERSAL I (CUSTOM PROCEDURE TRAY) ×4 IMPLANT
PENCIL BUTTON HOLSTER BLD 10FT (ELECTRODE) ×4 IMPLANT
POUCH SPECIMEN RETRIEVAL 10MM (ENDOMECHANICALS) IMPLANT
QUICK LOAD TK 5 (STAPLE)
RELOAD STAPLE 60 3.6 BLU REG (STAPLE) ×4 IMPLANT
RELOAD STAPLE 60 3.8 GOLD REG (STAPLE) IMPLANT
RELOAD STAPLE 60 4.1 GRN THCK (STAPLE) ×2 IMPLANT
RELOAD STAPLER BLUE 60MM (STAPLE) IMPLANT
RELOAD STAPLER GOLD 60MM (STAPLE) ×4 IMPLANT
RELOAD STAPLER GREEN 60MM (STAPLE) IMPLANT
SCISSORS LAP 5X35 DISP (ENDOMECHANICALS) IMPLANT
SCISSORS LAP 5X45 EPIX DISP (ENDOMECHANICALS) ×4 IMPLANT
SEALANT SURGICAL APPL DUAL CAN (MISCELLANEOUS) ×2 IMPLANT
SET IRRIG TUBING LAPAROSCOPIC (IRRIGATION / IRRIGATOR) ×4 IMPLANT
SHEARS CURVED HARMONIC AC 45CM (MISCELLANEOUS) ×4 IMPLANT
SLEEVE GASTRECTOMY 36FR VISIGI (MISCELLANEOUS) ×4 IMPLANT
SLEEVE XCEL OPT CAN 5 100 (ENDOMECHANICALS) ×10 IMPLANT
SOLUTION ANTI FOG 6CC (MISCELLANEOUS) ×4 IMPLANT
SPONGE DRAIN TRACH 4X4 STRL 2S (GAUZE/BANDAGES/DRESSINGS) ×2 IMPLANT
STAPLE ECHEON FLEX 60 POW ENDO (STAPLE) ×2 IMPLANT
STAPLER ECHELON LONG 60 440 (INSTRUMENTS) ×2 IMPLANT
STAPLER RELOAD BLUE 60MM (STAPLE)
STAPLER RELOAD GOLD 60MM (STAPLE) ×8
STAPLER RELOAD GREEN 60MM (STAPLE)
SUT ETHILON 2 0 PS N (SUTURE) ×2 IMPLANT
SUT MNCRL AB 4-0 PS2 18 (SUTURE) ×4 IMPLANT
SUT SURGIDAC NAB ES-9 0 48 120 (SUTURE) IMPLANT
SYR 20CC LL (SYRINGE) ×8 IMPLANT
SYR 50ML LL SCALE MARK (SYRINGE) ×4 IMPLANT
TAPE CLOTH SURG 4X10 WHT LF (GAUZE/BANDAGES/DRESSINGS) ×2 IMPLANT
TOWEL OR NON WOVEN STRL DISP B (DISPOSABLE) ×4 IMPLANT
TRAY FOLEY CATH 14FRSI W/METER (CATHETERS) ×2 IMPLANT
TROCAR BLADELESS OPT 5 100 (ENDOMECHANICALS) ×4 IMPLANT
TROCAR UNIVERSAL OPT 12M 100M (ENDOMECHANICALS) IMPLANT
TROCAR XCEL 12X100 BLDLESS (ENDOMECHANICALS) ×4 IMPLANT
TUBING CONNECTING 10 (TUBING) ×3 IMPLANT
TUBING CONNECTING 10' (TUBING) ×1
TUBING ENDO SMARTCAP (MISCELLANEOUS) ×4 IMPLANT
TUBING FILTER THERMOFLATOR (ELECTROSURGICAL) ×4 IMPLANT

## 2014-06-04 NOTE — Op Note (Signed)
[name] [id number] 12/19/1945 06/04/2014  Preoperative diagnosis: removal of gastric band with gastrorrhaphy  Postoperative diagnosis: Same   Procedure: Upper endoscopy   Surgeon: Susy FrizzleMatt B. Daphine DeutscherMartin  M.D., FACS   Anesthesia: Gen.   Indications for procedure: This patient was undergoing a band removal with contemplation of sleeve.  A gastrotomy was made taking down the plication of the band.  Endoscopy to rule out other perforations and verify anatomy.    Description of procedure: The endoscopy was placed in the mouth and into the oropharynx and under endoscopic vision it was advanced to the esophagogastric junction.  The stomach was insufflated and examined and retroflexed.  No bleeding or bubbles noted.   No bleeding or leaks were detected.  The scope was withdrawn without difficulty.     Matt B. Daphine DeutscherMartin, MD, FACS General, Bariatric, & Minimally Invasive Surgery [facility] [place]

## 2014-06-04 NOTE — Consult Note (Signed)
PULMONARY / CRITICAL CARE MEDICINE   Name: [name] MRN: [id number] DOB: [birth year]    ADMISSION DATE:  06/04/2014 CONSULTATION DATE:  06/04/14  REFERRING MD :  Dr. Andrey Campanile   CHIEF COMPLAINT:  Hypotension   INITIAL PRESENTATION: 68 y/o F admitted 12/22 for planned removal of lapband with conversion to sleeve gastrectomy.  She developed hypotension & bradycardia in OR and gastric sleeve resection was aborted and patient returned to ICU.  PCCM consulted for evaluation.    STUDIES:  12/22 ECHO >>    SIGNIFICANT EVENTS: 12/22  Admit for planned removal of lapband with conversion to sleeve gastrectomy, hypotensive/bradycardic in OR and gastrectomy aborted    HISTORY OF PRESENT ILLNESS: 68 y/o F with a PMH of Depression / Fibromyalgia, CAD, HTN, HLD, Hypothyroidism, chronic kidney disease (baseline sr cr ~ 1.3), laparoscopic ventral hernia repair w gortex mesh (2004) and morbid obesity who was admitted on 12/22 for a planned removal of lap band (placed in 2013) with conversion to sleeve gastrectomy.  The patient has had difficulty with weight loss despite adjustment of band, exercise and following dietary recommendations.  Most recently, she has also had problems with reflux of solid foods but no difficulty with liquids.  She took daily medications as prescribed prior to surgery as instructed.  Intra-op, she developed hypotension and bradycardia that was treated with dopamine.  OR records note SBP as low as 77 and HR in low 60's.  Propofol used for sedation during case.  Due to hemodynamic issues, the wedge resection was deferred and patient returned to ICU for observation.  PCCM consulted for evaluation.    Currently, patient denies chest pain, pain with inspiration, cough, sputum production, shortness of breath, n/v, abdominal pain and fevers / chills.    PAST MEDICAL HISTORY :   has a past medical history of Palpitations; Depression; Hyperlipidemia; Hypertension; Hypothyroidism;  Obesity (BMI 30-39.9); CAD (coronary artery disease); Arthritis; Fibromyalgia; PONV (postoperative nausea and vomiting); Nasal stenosis; and Chronic kidney disease.  has past surgical history that includes Appendectomy; Cholecystectomy; Tonsillectomy; Total knee arthroplasty (2003 (R); 2010 (L); 2012 (L clean-out)); Laparoscopic incisional / umbilical / ventral hernia repair (2004); left nephrectomy (2012); Total abdominal hysterectomy (1978); Laparoscopic gastric banding (05/22/2012); Laparoscopic lysis of adhesions (05/22/2012); and Mesh applied to lap port (05/22/2012).   HOME MEDICATIONS:  Prior to Admission medications   Medication Sig Start Date End Date Taking? Authorizing Provider  amLODipine (NORVASC) 5 MG tablet Take 1 tablet (5 mg total) by mouth daily. Patient taking differently: Take 5 mg by mouth every morning.  06/12/12  Yes Atilano Ina, MD  aspirin 81 MG EC tablet Take 81 mg by mouth every morning.    Yes Historical Provider, MD  buPROPion (WELLBUTRIN) 75 MG tablet Take 1 tablet (75 mg total) by mouth 2 (two) times daily. Patient taking differently: Take 75 mg by mouth every morning.  06/04/12  Yes Atilano Ina, MD  Calcium Carbonate-Vitamin D (CALTRATE 600+D) 600-400 MG-UNIT per tablet Take 1 tablet by mouth every morning.    Yes Historical Provider, MD  CALCIUM CITRATE PO Take 1 tablet by mouth 3 (three) times daily.   Yes Historical Provider, MD  Cholecalciferol (VITAMIN D3) 1000 UNITS CAPS Take 1 capsule by mouth daily after lunch.    Yes Historical Provider, MD  clonazePAM (KLONOPIN) 2 MG tablet Take 2 mg by mouth at bedtime.   Yes Historical Provider, MD  dextromethorphan-guaiFENesin (MUCINEX DM) 30-600 MG per 12 hr tablet Take 2 tablets  by mouth 2 (two) times daily.   Yes Historical Provider, MD  doxepin (SINEQUAN) 25 MG capsule Take 50 mg by mouth every morning.    Yes Historical Provider, MD  DULoxetine (CYMBALTA) 30 MG capsule Take 30 mg by mouth 2 (two) times daily.    Yes  Historical Provider, MD  levothyroxine (SYNTHROID, LEVOTHROID) 75 MCG tablet Take 75 mcg by mouth daily before breakfast. Pt gets brand name   Yes Historical Provider, MD  Multiple Vitamin (MULTIVITAMIN) tablet Take 1 tablet by mouth every morning.    Yes Historical Provider, MD  OXYCODONE HCL PO Take 5-10 mLs by mouth every 4 (four) hours as needed (pain). 5mg /505ml   Yes Historical Provider, MD  pregabalin (LYRICA) 75 MG capsule Take 75 mg by mouth 2 (two) times daily.   Yes Historical Provider, MD  rosuvastatin (CRESTOR) 20 MG tablet Take 20 mg by mouth at bedtime.    Yes Historical Provider, MD  vitamin B-12 (CYANOCOBALAMIN) 1000 MCG tablet Take 1,000 mcg by mouth every morning.    Yes Historical Provider, MD   No Known Allergies  FAMILY HISTORY:  is adopted.   SOCIAL HISTORY:  reports that she has never smoked. She has never used smokeless tobacco. She reports that she drinks alcohol. She reports that she does not use illicit drugs.  REVIEW OF SYSTEMS:   Gen: Denies fever, chills, weight change, fatigue, night sweats HEENT: Denies blurred vision, double vision, hearing loss, tinnitus, sinus congestion, rhinorrhea, sore throat, neck stiffness, dysphagia PULM: Denies shortness of breath, cough, sputum production, hemoptysis, wheezing CV: Denies chest pain, edema, orthopnea, paroxysmal nocturnal dyspnea, palpitations GI: Denies abdominal pain, nausea, vomiting, diarrhea, hematochezia, melena, constipation, change in bowel habits GU: Denies dysuria, hematuria, polyuria, oliguria, urethral discharge Endocrine: Denies hot or cold intolerance, polyuria, polyphagia or appetite change Derm: Denies rash, dry skin, scaling or peeling skin change Heme: Denies easy bruising, bleeding, bleeding gums Neuro: Denies headache, numbness, weakness, slurred speech, loss of memory or consciousness  SUBJECTIVE:   VITAL SIGNS: Temp:  [97.3 F (36.3 C)-98.1 F (36.7 C)] 97.3 F (36.3 C) (12/22  1055) Pulse Rate:  [66-86] 70 (12/22 1115) Resp:  [13-20] 15 (12/22 1115) BP: (77-143)/(37-71) 100/49 mmHg (12/22 1115) SpO2:  [83 %-100 %] 99 % (12/22 1100) FiO2 (%):  [100 %] 100 % (12/22 1010) Weight:  [235 lb (106.595 kg)] 235 lb (106.595 kg) (12/22 0505)   HEMODYNAMICS:     VENTILATOR SETTINGS: Vent Mode:  [-]  FiO2 (%):  [100 %] 100 %   INTAKE / OUTPUT:  Intake/Output Summary (Last 24 hours) at 06/04/14 1123 Last data filed at 06/04/14 1100  Gross per 24 hour  Intake   3550 ml  Output    320 ml  Net   3230 ml    PHYSICAL EXAMINATION: General:  Obese adult female in NAD, pleasant Neuro:  AAOx4, speech clear, MAE HEENT:  MM pink/dry, short neck, no jvd noted  Cardiovascular:  s1s2 rrr, no m/r/g Lungs:  resp's even/non-labored on [place] O2, lungs bilaterally clear  Abdomen:  Obese, soft, bsx4 active.  JP drain LL with serosanguinous drainage, lap incisions c/d/i, no drainage from sites  Musculoskeletal:  No acute deformities  Skin:  Warm/dry, no edema   LABS:  CBC  Recent Labs Lab 05/30/14 1030  WBC 7.2  HGB 14.0  HCT 44.6  PLT 245   Coag's No results for input(s): APTT, INR in the last 168 hours.   BMET  Recent Labs Lab 05/30/14  1030 06/04/14 0730 06/04/14 1012  NA 139  --  139  K 4.7  --  3.8  CL 102  --  105  CO2 26  --  25  BUN 27*  --  25*  CREATININE 1.31* 1.38* 1.54*  GLUCOSE 88  --  173*   Electrolytes  Recent Labs Lab 05/30/14 1030 06/04/14 1012  CALCIUM 10.3 9.7   Sepsis Markers No results for input(s): LATICACIDVEN, PROCALCITON, O2SATVEN in the last 168 hours.   ABG No results for input(s): PHART, PCO2ART, PO2ART in the last 168 hours.   Liver Enzymes  Recent Labs Lab 05/30/14 1030  AST 25  ALT 26  ALKPHOS 107  BILITOT 0.4  ALBUMIN 4.0   Cardiac Enzymes  Recent Labs Lab 06/04/14 1012  TROPONINI <0.03   Glucose No results for input(s): GLUCAP in the last 168 hours.  Imaging No results found.   ASSESSMENT  / PLAN:  CARDIOVASCULAR CVL A:  Hypotension - required fluids (approx 3L ), epi, neo, atropine,dopamine during case, BP in 70's.  Office records reflect 120's systolic, 100 mg cortisone at end of case Bradycardia  CAD HTN (hx) HLD P:  Wean dopamine gtt for HR > 50, SBP > 90 Hold home regimen:  Norvasc, crestor, ASA Additional normal saline bolus x1 L S/p solu-cortef in OR 100 mg Assess EHCO  Cycle troponin  PULMONARY OETT A: At Risk Post-Op Atx P:   IS / Pulmonary hygiene  RENAL A:   CKD - baseline sr cr ~ 1.3 Questionable hx of Renal Cell Carcinoma s/p Nephrectomy - surgery completed in in [place], final path did not mention carcinoma P:   Ensure adequate perfusion / hydration   Trend BMP  D5 1/2 NS with 20 mEq KCL @ 125 ml/hr  GASTROINTESTINAL A:   Morbid Obesity - s/p lap band removal.  Lowest weight 210.   P:   NPO, defer diet to CCS  HEMATOLOGIC A:   No acute issues P:  Monitor CBC  INFECTIOUS A:   Post-Op Lap Band Removal - no indication of acute infection.  P:   Monitor fever curve / WBC trend  ENDOCRINE A:   Hypothyroidism  At risk Hypoglycemia  P:   Assess TSH in setting of bradycardia / hypotension  Resume synthroid, IV dosing for now   NEUROLOGIC A:   Pain - post op removal of lap band P:   Morphine 2-4 mg Q2 PRN pain  Minimize sedating medications as able    FAMILY  - Updates: Husband updated at bedside.     Canary Brim, NP-C [facility] Pgr: [phone] or [phone]    06/04/2014, 11:23 AM   Attending:  I have seen and examined the patient with nurse practitioner/resident and agree with the note above.   On my exam [name] is resting comfortably without pain. She does not appear toxic. Her lungs are clear and her heart exam is normal. I have reviewed her lab work as well as her history with her husband. It seems like the most likely scenario would be adrenal insufficiency but she has no history of  that nor has she ever taken steroids for long periods of time in the past. So at this point is not clear what is causing her hypotension but she does not have evidence of an acute cardiac event or sepsis.  Plan:  -careful monitoring in the ICU, wean dopamine  My care time 40 minutes  Heber [place], MD [facility] PCCM  Pager: [phone] Cell: [phone] If no response, call [phone]

## 2014-06-04 NOTE — Interval H&P Note (Signed)
History and Physical Interval Note:  06/04/2014 7:13 AM  [name]  has presented today for surgery, with the diagnosis of Morbid Obesity  The various methods of treatment have been discussed with the patient and family. After consideration of risks, benefits and other options for treatment, the patient has consented to  Procedure(s): LAPAROSCOPIC GASTRIC BAND REMOVAL WITH LAPAROSCOPIC GASTRIC SLEEVE RESECTION (N/A) with upper endoscopy as a surgical intervention .  The patient's history has been reviewed, patient examined, no change in status, stable for surgery.  I have reviewed the patient's chart and labs.  Questions were answered to the patient's satisfaction.    Mary SellaEric Drake. Andrey CampanileWilson, MD, FACS General, Bariatric, & Minimally Invasive Surgery [facility], [place]   [facility]ILSON,[name]

## 2014-06-04 NOTE — Transfer of Care (Signed)
Immediate Anesthesia Transfer of Care Note  Patient: Margarite GougeJanet L Tkach  Procedure(s) Performed: Procedure(s): LAPAROSCOPIC GASTRIC BAND REMOVAL, LYSIS OF ADHESIONS,  GASTRIC WEDGE RESECTION UPPER ENDOSCOPY (N/A) LAPAROSCOPIC LYSIS OF ADHESIONS UPPER GI ENDOSCOPY  Patient Location: PACU  Anesthesia Type:General  Level of Consciousness: awake and oriented  Airway & Oxygen Therapy: Patient Spontanous Breathing and Patient connected to face mask oxygen  Post-op Assessment: Report given to PACU RN and Post -op Vital signs reviewed and unstable, Anesthesiologist notified  Post vital signs: Reviewed and unstable  Complications: No apparent anesthesia complications

## 2014-06-04 NOTE — Progress Notes (Signed)
Utilization review completed.  

## 2014-06-04 NOTE — Brief Op Note (Signed)
06/04/2014  10:06 AM  PATIENT:  [name]  68 y.o. female  PRE-OPERATIVE DIAGNOSIS:  Morbid Obesity BMI 37 CAD HTN H/o lap adjustable gastric banding Dyslipidemia hypothyroidism  POST-OPERATIVE DIAGNOSIS: same + intra-abdominal adhesions; gastrotomy  PROCEDURE:  Procedure(s): LAPAROSCOPIC GASTRIC BAND REMOVAL, LYSIS OF ADHESIONS,  GASTRIC WEDGE RESECTION UPPER ENDOSCOPY (N/A) LAPAROSCOPIC LYSIS OF ADHESIONS UPPER GI ENDOSCOPY  SURGEON:  Surgeon(s) and Role:    * Atilano InaEric M Lankford Gutzmer, MD FACS FASMBS- Primary  PHYSICIAN ASSISTANT: none  ASSISTANTS: Luretha MurphyMatthew Martin, MD FACS   ANESTHESIA:   local and general  EBL:  Total I/O In: 3050 [I.V.:3050] Out: 200 [Urine:150; Blood:50]  BLOOD ADMINISTERED:none  DRAINS: (18) Jackson-Pratt drain(s) with closed bulb suction in the LUQ near staple line   LOCAL MEDICATIONS USED:  OTHER exparel   SPECIMEN:  Source of Specimen:  gastric wedge  DISPOSITION OF SPECIMEN:  discarded  COUNTS:  YES  TOURNIQUET:  * No tourniquets in log *  DICTATION: .Other Dictation: Dictation Number [id number]  PLAN OF CARE: Admit to inpatient   PATIENT DISPOSITION:  PACU - hemodynamically stable.   Delay start of Pharmacological VTE agent (>24hrs) due to surgical blood loss or risk of bleeding: no  Mary SellaEric M. Andrey CampanileWilson, MD, FACS General, Bariatric, & Minimally Invasive Surgery [facility], [place]

## 2014-06-04 NOTE — H&P (Signed)
[name] 05/16/2014 2:30 PM Location: [facility] Patient #: [id number] DOB: 1945/10/31 Married / Language: English / Race: White Female  History of Present Illness Randall Hiss M. Stellar Gensel MD; 05/28/2014 10:47 PM) Patient words: discuss sx.  The patient is a 68 year old female who presents for a pre-op visit. She is currently scheduled to undergo removal of her lapband with conversion to Sleeve Gastrectomy later in the month. She underwent placement of a laparoscopic adjustable gastric band in December 2013. Over the past year or so she has been having ongoing issues with satisfactory weight loss. We have been struggling to find the correct amount of fluid to have in her band. She is also using correct eating techniques and behaviors as well is eating correct foods but is still not losing adequate amounts of weight. We have met on several prior occasions to discuss revisional surgery.  Most recently with respect to her band. She states that she has been unable to keep solid food down without it coming back up. She is able to keep liquids down without a problem. She denies any nighttime cough or reflux. Any type of meats, salad, or green beans she will regurgitate despite using proper eating techniques. She is walking and swimming on a regular basis  Her UGI in Jan showed normal LapBand position. Her CXR from a short time ago also showed the lapband in normal orientation. She has recieved cardiac clearance.  In the past she has undergone a prior laparoscopic ventral hernia repair with goretex mesh in 2004. During her LapBand surgery we took down omentum for about 30 minutes from her abdominal wall.   Other Problems Gayland Curry, MD; 05/28/2014 10:51 PM) LAP-BAND SURGERY STATUS (V45.86  Z98.84) OBESITY (BMI 30-39.9) (278.00  E66.9) HYPOTHYROIDISM, ADULT (244.9  E03.9) CORONARY ARTERY DISEASE WITHOUT ANGINA PECTORIS, UNSPECIFIED VESSEL OR LESION TYPE, UNSPECIFIED WHETHER NATIVE  OR TRANSPLANTED HEART (414.00  I25.10) HTN (HYPERTENSION), BENIGN (401.1  I10)  Past Surgical History Gayland Curry, MD; 05/28/2014 10:49 PM) Cholecystectomy Appendectomy Hysterectomy Ventral Hernia [phone] with Goretex mesh 15x19cm upper abd Nephrectomy left  Allergies Briant Cedar, CMA; 05/16/2014 2:34 PM) No Known Drug Allergies12/08/2013  Medication History Gayland Curry, MD; 05/28/2014 10:51 PM) Norvasc (5MG Tablet, Oral) Active. Baby Aspirin (81MG Tablet Chewable, Oral) Active. BuPROPion HCl (75MG Tablet, Oral) Active. Calcium Carbonate (600MG Tablet, Oral) Active. Cholecalciferol (10000UNIT Capsule, Oral) Active. Caltrate 600+D Plus (600-400MG-UNIT Tablet Chewable, Oral) Active. ClonazePAM ODT (2MG Tablet Disperse, Oral) Active. Dextromethorphan-GG (30-600MG Tablet ER, Oral) Active. Synthroid (75MCG Tablet, Oral) Active. Lyrica (75MG Capsule, Oral) Active. Crestor (20MG Tablet, Oral) Active. Vitamin B 12 (100MCG Lozenge, Oral) Active. OxyCODONE HCl (5MG/5ML Solution, 5-10 Milliliter Oral every four hours, as needed, Taken starting 05/16/2014) Active.  Vitals Briant Cedar CMA; 05/16/2014 2:40 PM) 05/16/2014 2:39 PM Weight: 237.38 lb Height: 67in Body Surface Area: 2.26 m Body Mass Index: 37.18 kg/m Temp.: 98.14F  Pulse: 89 (Regular)  BP: 140/98 (Sitting, Left Arm, Standard)    Physical Exam Randall Hiss M. Thaison Kolodziejski MD; 05/28/2014 10:31 PM) General Mental Status-Alert. General Appearance-Consistent with stated age. Hydration-Well hydrated. Voice-Normal.  Head and Neck Head-normocephalic, atraumatic with no lesions or palpable masses. Trachea-midline. Thyroid Gland Characteristics - normal size and consistency.  Eye Eyeball - Bilateral-Extraocular movements intact. Sclera/Conjunctiva - Bilateral-No scleral icterus.  Chest and Lung Exam Chest and lung exam reveals -quiet, even and easy respiratory effort  with no use of accessory muscles and on auscultation, normal breath sounds, no adventitious sounds and normal  vocal resonance. Inspection Chest Wall - Normal. Back - normal.  Breast - Did not examine.  Cardiovascular Cardiovascular examination reveals -normal heart sounds, regular rate and rhythm with no murmurs and normal pedal pulses bilaterally.  Abdomen Inspection Inspection of the abdomen reveals - No Hernias. Palpation/Percussion Palpation and Percussion of the abdomen reveal - Soft, Non Tender, No Rebound tenderness, No Rigidity (guarding) and No hepatosplenomegaly. Auscultation Auscultation of the abdomen reveals - Bowel sounds normal. Note: WELL HEALED TROCAR SCARS. PALPABLE RIGHT MID ABDOMEN SUBCU PORT.   Peripheral Vascular Upper Extremity Palpation - Pulses bilaterally normal.  Neurologic Neurologic evaluation reveals -alert and oriented x 3 with no impairment of recent or remote memory. Mental Status-Normal.  Neuropsychiatric The patient's mood and affect are described as -normal. Judgment and Insight-insight is appropriate concerning matters relevant to self.  Musculoskeletal Normal Exam - Left-Upper Extremity Strength Normal and Lower Extremity Strength Normal. Normal Exam - Right-Upper Extremity Strength Normal and Lower Extremity Strength Normal.  Lymphatic Head & Neck  General Head & Neck Lymphatics: Bilateral - Description - Normal. Axillary - Did not examine. Femoral & Inguinal - Did not examine.    Results Randall Hiss M. Willmer Fellers MD; 05/28/2014 10:51 PM) Procedures  Name Value Date Lap Band Adjustment [facility]) [ Lap Band Adjustment ] Procedure Procedure: Informed verbal consent was obtained for adjustment of the band. The port site was positively identified and prepped with isopropyl alcohol. The port was accessed with a 20G Huber needle and clear fluid was aspirated. 3 mL saline was XXX the port to give a predicted volume of 0 mL. A  sterile bandage was applied. The patient was able to take sips of water without difficulty. The patient tolerated the procedure well without complication.  Performed: 05/28/2014 10:50 PM   Assessment & Plan Randall Hiss M. Alma Mohiuddin MD; 05/28/2014 10:49 PM) OBESITY (BMI 30-39.9) (278.00  E66.9) Impression: We discussed her workup to date. She appears ready for upcoming revisional surgery. She has watched videos about sleeve gastrectomy and understands that revisional surgery is higher risk than native surgery. We discussed that ideally we will perform her surgery in a single stage procedure but if for some reason there is extensive/excessive trauma to the stomach while removing the lapband where I feel proceeding with same day sleeve gastrectomy would be very risky then we may just do LapBand removal only and do the sleeve at a later date. We also discussed that since she has only 1 kidney we will have to watch her kidney function post discharge. Current Plans  Instructions: Congratulations on starting your journey to a healthier life! Over the next few weeks you will be undergoing tests (x-rays and labs) and seeing specialists to help evaluate you for weight loss surgery. These tests and consultations with a psychologist and nutritionist are needed to prepare you for the lifestyle changes that lie ahead and are often required by insurance companies to approve you for surgery. Please call me if you have any questions during the evaluation.  Pathway to Surgery:  Two weeks prior to surgery Go on the extremely low carb liquid diet - this will decrease the size of your liver which will make surgery safer - the nutritionist will go over this at a later date Attend preoperative appointment with your surgeon Attend preoperative surgery class  One week prior to surgery No aspirin products. Tylenol is acceptable  24 hours prior to surgery No alcoholic beverages Report fever greater than 100.5 or excessive  nasal drainage suggesting infection Continue bariatric  preop diet Perform bowel prep if ordered Do not eat or drink anything after midnight the night before surgery Do not take any medications except those instructed by the anesthesiologist  Morning of surgery Please arrive at the hospital at least 2 hours before your scheduled surgery time. No makeup, fingernail polish or jewelry Bring insurance cards with you Bring your CPAP mask if you use this Started OxyCODONE HCl 5MG/5ML, 5-10 Milliliter every four hours, as needed, 200 Milliliter, 05/16/2014, No Refill.  CORONARY ARTERY DISEASE WITHOUT ANGINA PECTORIS, UNSPECIFIED VESSEL OR LESION TYPE, UNSPECIFIED WHETHER NATIVE OR TRANSPLANTED HEART (414.00  I25.10)  HTN (HYPERTENSION), BENIGN (401.1  I10)  LAP-BAND SURGERY STATUS (V45.86  Z98.84) Impression: Because she has upcoming surgery for removal of lapband with conversion to sleeve gastrectomy but moreover because she is still having issues with solids despite good eating techniques, I believe we need to remove all remaining fluid from her band today. I explained that if she is still symptomatic after her band is emptied then we may need to get a UGI prior to surgery. She voiced understaning. Current Plans Free Text Instructions : discussed with patient and provided information. Lap Band Adjustment (MCR)  HYPOTHYROIDISM, ADULT (244.9  E03.9)  Shay Bartoli M. Jadian Karman, MD, FACS General, Bariatric, & Minimally Invasive Surgery [facility] [place]   

## 2014-06-04 NOTE — Anesthesia Preprocedure Evaluation (Signed)
Anesthesia Evaluation  Patient identified by MRN, date of birth, ID band Patient awake    Reviewed: Allergy & Precautions, H&P , NPO status , Patient's Chart, lab work & pertinent test results  History of Anesthesia Complications (+) PONV  Airway Mallampati: II  TM Distance: >3 FB Neck ROM: full    Dental  (+) Edentulous Upper, Missing, Dental Advisory Given,    Pulmonary shortness of breath and with exertion,  breath sounds clear to auscultation  Pulmonary exam normal       Cardiovascular hypertension, Pt. on medications + CAD Rhythm:regular Rate:Normal  palpitations   Neuro/Psych negative neurological ROS  negative psych ROS   GI/Hepatic negative GI ROS, Neg liver ROS,   Endo/Other  negative endocrine ROSHypothyroidism   Renal/GU negative Renal ROS  negative genitourinary   Musculoskeletal  (+) Fibromyalgia -  Abdominal   Peds  Hematology negative hematology ROS (+)   Anesthesia Other Findings   Reproductive/Obstetrics negative OB ROS                             Anesthesia Physical  Anesthesia Plan  ASA: III  Anesthesia Plan: General   Post-op Pain Management:    Induction: Intravenous  Airway Management Planned: Oral ETT  Additional Equipment:   Intra-op Plan:   Post-operative Plan: Extubation in OR  Informed Consent: I have reviewed the patients History and Physical, chart, labs and discussed the procedure including the risks, benefits and alternatives for the proposed anesthesia with the patient or authorized representative who has indicated his/her understanding and acceptance.   Dental Advisory Given  Plan Discussed with: CRNA and Surgeon  Anesthesia Plan Comments:         Anesthesia Quick Evaluation

## 2014-06-04 NOTE — Progress Notes (Signed)
Patient alert and oriented, Post op day 1.  Provided support and encouragement.  Encouraged pulmonary toilet, ambulation and small sips of liquids.  All questions answered.  Will continue to monitor. 

## 2014-06-04 NOTE — Op Note (Signed)
NAMESTARIA, [name]             ACCOUNT NO.:  [id number]  MEDICAL RECORD NO.:  [id number]  LOCATION:  [facility]                         FACILITY:  [facility]  PHYSICIAN:  Leighton Ruff. Redmond Pulling, MD, FACSDATE OF BIRTH:  [birth year]  DATE OF PROCEDURE:  06/04/2014 DATE OF DISCHARGE:                              OPERATIVE REPORT   PREOPERATIVE DIAGNOSES: 1. Obesity, BMI 37. 2. History of laparoscopic adjustable gastric band placement in 2013. 3. Coronary artery disease. 4. Hypertension. 5. Dyslipidemia. 6. History of left nephrectomy due to renal cell carcinoma.  POSTOPERATIVE DIAGNOSES: 1. Obesity, BMI 37. 2. Hypertension. 3. Coronary artery disease. 4. History of laparoscopic adjustable gastric band placement. 5. Dyslipidemia. 6. History of left nephrectomy secondary to renal cell carcinoma. 7. Intra-abdominal adhesions. 8. Gastrotomy.  PROCEDURE: 1. Laparoscopic lysis of adhesions. 2. Laparoscopic removal of adjustable gastric band. 3. Laparoscopic gastric wedge resection. 4. Upper endoscopy.  SURGEON:  Leighton Ruff. Redmond Pulling, MD FACS FASMBS  ASSISTANT SURGEON:  Isabel Caprice. Hassell Done, MD FACS  FINDINGS:  The patient had omental adhesions to her anterior abdominal wall as well as some transverse colon also stuck, this was taken down. The lap band was identified.  There was no evidence of a slip.  However, on the medial aspect of the band along the lesser curve on the stomach wall, there was no evidence of erosion but the coloring of the cicatrix was just a little bit abnormal in one spot for about half a cm, it was just not the typical appearance.  In taking down the cicatrix out laterally where the imbrication sutures were, the stomach was entered. We ended up completely removing the band and freeing up the imbrication sutures.  I felt the safest way to repair the defect in the stomach was to staple it off, this was done with 2 fires of an Echelon 60-mm gold Stapler. Because of the stomach  defect and hypotension during the case requiring a vasopressor, I felt it was not safe to proceed with sleeve gastrectomy today. Please see below. I felt the risks were too high for postoperative complications to proceed today.   INDICATIONS FOR PROCEDURE:  The patient is a 68 year old, Caucasian female, who has a history of morbid obesity, with coronary artery disease, hypertension, dyslipidemia, hypothyroidism, who underwent a laparoscopic adjustable gastric band placement in 2013.  She was initially successful and was able to lose some weight, but never met her weight loss goals, most recently, despite extensive counseling, she was having difficulty tolerating her lap Band.  We made sure there was no evidence of a slip.  We reinforced proper eating techniques but she was still having a fair amount of food intolerance to solid foods.  Her band was completely drained, and she had some symptomatic improvement, but because of ongoing issues with the lap band and not being her weight loss goal, she was re-evaluated and was approved for revisional surgery. We did discuss preoperatively on several occasions the possibility of not being able to complete the surgery in one step that it may need to be done in 2 stages depending on how removable lap band when in case there was injury to the stomach in trying to free the  scar tissue from the lap band.  We discussed at length the other risks of surgery including, but not limited to, bleeding, infection, injury to surrounding structures, gastrotomy, blood clot formation, hernia formation, wound infection, anesthesia risk, perioperative cardiac and pulmonary complications, as well as a typical postoperative course.  DESCRIPTION OF PROCEDURE:  After obtaining informed consent, the patient was given 5000 units of heparin subcutaneously in the short stay area. She was then taken to the operating room, placed supine on the operating table.  General  endotracheal anesthesia was established.  A Foley catheter was placed.  Her abdomen was prepped and draped in usual standard surgical fashion.  She did have the typical drop in her blood pressure after induction of general anesthesia and responded to fluids and calcium and other measures taken by Anesthesia so we proceeded.  A surgical time-out was performed.  I used an old left lateral upper abdominal wall trocar site from prior surgery.  A skin incision was made there.  Then using a 0-degree 5-mm laparoscope through a 5-mm trocar advanced through all layers of abdominal wall entered the abdominal cavity.  Pneumoperitoneum was smoothly established up to a patient pressure of 15 mmHg.  The laparoscope was advanced and abdominal cavity was surveilled.  There was intra-abdominal adhesions to the anterior abdominal wall in the midline in upper abdomen.  These appeared mainly to be omental.  A lateral 5-mm trocar was placed in the left mid axillary line below the Optiview trocar under direct visualization through her old trocar site.  I started taking down the adhesions with the Harmonic Scalpel.  There was some transverse colon that was also tethered.  At this point, I switched the Endo shears.  The adhesions were completely freed.  I identified the lap band tubing where it went out through the abdominal wall.  At this point, we placed the patient in reverse Trendelenburg and placed remaining trocars.  A 5-mm trocar was placed slightly above into the left of the umbilicus.  A 5-mm trocar was placed in the lateral right upper quadrant and a 15 was placed in the right mid abdomen all under direct visualization after local was infiltrated.  She did have old Gore-Tex mesh in the central portion of her abdomen through which the 73mm trocar was placed through.  A liver retractor was placed through the subxiphoid position after local had been infiltrated.  The left lobe was lifted up exposing the  band.  There were a few adhesions between the band and the undersurface of the liver.  There was no evidence of a slip either anterior or posterior.  I cut the band tubing just to make it easier to get a nonobstructive view of the band.  I then took down some of the fibrous capsule on the top of the lap band with hook cautery.  I then unbuckled the band.  I then started taking down the fibrous capsule or cicatrix where the imbrication sutures had been placed at the 1 o'clock position.  I stayed on top of the band and this was primarily done with hook electrocautery as well as with Harmonic Scalpel.  It appeared that I was going through the area where one of the imbrication sutures had been made with the titanium tie knot, but after I divided this, there was an obvious defect in the stomach as we could see a small amount of bile.  I ended up freeing the rest of the stomach and taking it down from the  angle of His and identifying the left crus.  At this point, I removed the band from around the stomach, cut it in half and removed the remaining 2 pieces from the abdomen.  This just left the port and some of its tubing to be extracted on down the line.  I ended up clearing off some additional cicatrix and completely taking the fundoplication down. We then inspected the area where the defect had been made in the stomach, this was along the greater curve at the fundus.  Instead of closing it primarily with interrupted sutures, the defect was probably about 1.5 cm long.  I felt the safest and prudent course of action would be to staple it off.  I lifted it up.  There was excellent mobility and plenty of space so that I was able to bring an Echelon 60 mm gold stapler directly underneath the defect and fired across it.  This left about 1-2 mm of staple line that had not been transected so another fire of the Echelon was taken.  This freed a small little wedge and this was extracted from the  abdomen.  The staple line was in the upper fundus along the greater curve remote from where a future sleeve gastrectomy staple line would not be going through.  During the case, the patient became hypotensive.  There was no evidence of excessive bleeding or active bleeding.  There was obviously a little bit of ooze from where we had taken down some of the scar tissue.  She required dopamine.  Because of the hypotension requiring a vasopressor, the defect that had been made in the stomach and her creatinine being slightly elevated above baseline I felt it would not be safe to proceed with a sleeve gastrectomy today. I felt there were too many risk factors that would unnecessarily increase her risk of postoperative leak, so I decided to stop the procedure at that point and not perform the sleeve gastrectomy and plan to bring her back in the future to complete that portion of the procedure.  I did leave a 19-French Blake drain in the left upper quadrant along the staple line and brought it out through one of the 5- mm trocar sites and secured to the skin with a 2-0 nylon.  I then removed the 15-mm trocar in the right mid abdomen and closed the fascial defect and mesh with a single interrupted 0 Vicryl suture using the Endo Passer.  I then injected Exparel around all the trocar sites using laparoscopic guidance into the preperitoneal space.  Pneumoperitoneum was released and all trocars removed.  I then made a small incision through her old scar or port site with an 11 blade.  The subcutaneous tissue was divided with electrocautery.  I came across the catheter on the port.  It was opened up and then using a towel clamp, I was able to remove the port along with this entire tubing intact.  I then excised the remaining fibrous capsule.  The area was inspected, hemostasis was achieved.  Additional Exparel was infiltrated in this space.  The deep dermis was reapproximated with inverted interrupted  2-0 Vicryl.  All skin incisions were then closed with a running 4-0 Monocryl in subcuticular fashion followed by application of LiquiBand exceed.  The patient was extubated and brought to the recovery room.  We will be checking labs and EKG.  All needle, instrument, sponge counts were correct x2.     Leighton Ruff. Redmond Pulling, MD, FACS  EMW/MEDQ  D:  06/04/2014  T:  06/04/2014  Job:  [id number]  cc:   Ernestene Kiel, MD Fax: [phone]  Denice Bors. Stanford Breed, MD, [facility]

## 2014-06-04 NOTE — Progress Notes (Signed)
Echocardiogram 2D Echocardiogram has been performed.  [name] 06/04/2014, 1:48 PM 

## 2014-06-04 NOTE — Anesthesia Postprocedure Evaluation (Signed)
  Anesthesia Post-op Note  Patient: [name]  Procedure(s) Performed: Procedure(s) (LRB): LAPAROSCOPIC GASTRIC BAND REMOVAL, LYSIS OF ADHESIONS,  GASTRIC WEDGE RESECTION UPPER ENDOSCOPY (N/A) LAPAROSCOPIC LYSIS OF ADHESIONS UPPER GI ENDOSCOPY  Patient Location: PACU  Anesthesia Type: General  Level of Consciousness: awake and alert   Airway and Oxygen Therapy: Patient Spontanous Breathing  Post-op Pain: mild  Post-op Assessment: Post-op Vital signs reviewed, Patient's Cardiovascular Status unstable, Respiratory Function Stable, Patent Airway and No signs of Nausea or vomiting  Last Vitals:  Filed Vitals:   06/04/14 1115  BP: 100/49  Pulse: 70  Temp:   Resp: 15    Post-op Vital Signs: hypotension   Complications: persistent refractory hypotension intra and post op. Unable to wean dopamine. Consider hypotension from norepi depletion from antidepressants.

## 2014-06-05 ENCOUNTER — Encounter (HOSPITAL_COMMUNITY): Payer: Self-pay | Admitting: General Surgery

## 2014-06-05 DIAGNOSIS — E038 Other specified hypothyroidism: Secondary | ICD-10-CM

## 2014-06-05 LAB — COMPREHENSIVE METABOLIC PANEL
ALK PHOS: 72 U/L (ref 39–117)
ALT: 32 U/L (ref 0–35)
AST: 38 U/L — ABNORMAL HIGH (ref 0–37)
Albumin: 3.7 g/dL (ref 3.5–5.2)
Anion gap: 5 (ref 5–15)
BUN: 17 mg/dL (ref 6–23)
CO2: 26 mmol/L (ref 19–32)
Calcium: 9.4 mg/dL (ref 8.4–10.5)
Chloride: 112 mEq/L (ref 96–112)
Creatinine, Ser: 1.11 mg/dL — ABNORMAL HIGH (ref 0.50–1.10)
GFR, EST AFRICAN AMERICAN: 58 mL/min — AB (ref >90)
GFR, EST NON AFRICAN AMERICAN: 50 mL/min — AB (ref >90)
GLUCOSE: 142 mg/dL — AB (ref 70–99)
POTASSIUM: 5.2 mmol/L — AB (ref 3.5–5.1)
SODIUM: 143 mmol/L (ref 135–145)
Total Bilirubin: 0.3 mg/dL (ref 0.3–1.2)
Total Protein: 6.5 g/dL (ref 6.0–8.3)

## 2014-06-05 LAB — HEMOGLOBIN AND HEMATOCRIT, BLOOD
HCT: 39.7 % (ref 36.0–46.0)
Hemoglobin: 12.9 g/dL (ref 12.0–15.0)

## 2014-06-05 LAB — CBC WITH DIFFERENTIAL/PLATELET
Basophils Absolute: 0 10*3/uL (ref 0.0–0.1)
Basophils Relative: 0 % (ref 0–1)
EOS ABS: 0 10*3/uL (ref 0.0–0.7)
Eosinophils Relative: 0 % (ref 0–5)
HCT: 37.9 % (ref 36.0–46.0)
HEMOGLOBIN: 12.2 g/dL (ref 12.0–15.0)
LYMPHS ABS: 0.9 10*3/uL (ref 0.7–4.0)
LYMPHS PCT: 7 % — AB (ref 12–46)
MCH: 29 pg (ref 26.0–34.0)
MCHC: 32.2 g/dL (ref 30.0–36.0)
MCV: 90.2 fL (ref 78.0–100.0)
MONOS PCT: 6 % (ref 3–12)
Monocytes Absolute: 0.8 10*3/uL (ref 0.1–1.0)
Neutro Abs: 11.7 10*3/uL — ABNORMAL HIGH (ref 1.7–7.7)
Neutrophils Relative %: 87 % — ABNORMAL HIGH (ref 43–77)
PLATELETS: 197 10*3/uL (ref 150–400)
RBC: 4.2 MIL/uL (ref 3.87–5.11)
RDW: 13.1 % (ref 11.5–15.5)
WBC: 13.5 10*3/uL — AB (ref 4.0–10.5)

## 2014-06-05 LAB — TROPONIN I

## 2014-06-05 MED ORDER — DEXTROSE-NACL 5-0.45 % IV SOLN
INTRAVENOUS | Status: DC
  Administered 2014-06-05: 09:00:00 via INTRAVENOUS
  Administered 2014-06-06: 50 mL via INTRAVENOUS

## 2014-06-05 NOTE — Progress Notes (Signed)
1 Day Post-Op  Subjective: Looks good. Off dopamine gtt. Little to no pain. No emesis.   Objective: Vital signs in last 24 hours: Temp:  [97.3 F (36.3 C)-98.9 F (37.2 C)] 98.9 F (37.2 C) (12/23 0400) Pulse Rate:  [29-89] 63 (12/23 0500) Resp:  [10-30] 10 (12/23 0500) BP: (77-157)/(37-100) 145/71 mmHg (12/23 0500) SpO2:  [83 %-100 %] 97 % (12/23 0500) FiO2 (%):  [100 %] 100 % (12/22 1010) Weight:  [249 lb 5.4 oz (113.1 kg)] 249 lb 5.4 oz (113.1 kg) (12/22 1115)    Intake/Output from previous day: 12/22 0701 - 12/23 0700 In: 5840.4 [I.V.:5810.4] Out: 3330 [Urine:3200; Drains:80; Blood:50] Intake/Output this shift:    Alert, nad, nontoxic cta b/l Reg Soft, nd, incisions c/d/i; expected mild ttp; JP-serosang +scds  Lab Results:   Recent Labs  06/04/14 1012 06/05/14 0545  WBC  --  13.5*  HGB 13.0 12.2  HCT 39.2 37.9  PLT  --  197   BMET  Recent Labs  06/04/14 1012 06/05/14 0545  NA 139 143  K 3.8 5.2*  CL 105 112  CO2 25 26  GLUCOSE 173* 142*  BUN 25* 17  CREATININE 1.54* 1.11*  CALCIUM 9.7 9.4   PT/INR No results for input(s): LABPROT, INR in the last 72 hours. ABG No results for input(s): PHART, HCO3 in the last 72 hours.  Invalid input(s): PCO2, PO2  Studies/Results: No results found.  Anti-infectives: Anti-infectives    Start     Dose/Rate Route Frequency Ordered Stop   06/04/14 1030  cefOXitin (MEFOXIN) 2 g in dextrose 5 % 50 mL IVPB     2 g100 mL/hr over 30 Minutes Intravenous  Once 06/04/14 0932 06/04/14 1056   06/04/14 0506  cefOXitin (MEFOXIN) 2 g in dextrose 5 % 50 mL IVPB     2 g100 mL/hr over 30 Minutes Intravenous On call to O.R. 06/04/14 0506 06/04/14 0732      Assessment/Plan: s/p Procedure(s): LAPAROSCOPIC GASTRIC BAND REMOVAL, LYSIS OF ADHESIONS,  GASTRIC WEDGE RESECTION UPPER ENDOSCOPY (N/A) LAPAROSCOPIC LYSIS OF ADHESIONS UPPER GI ENDOSCOPY  Hypotension - ?due to mood medications. Off dopa. Echo ok. Follow bp today.  Hold norvasc for now GI- no swallow today. Start POD 1 bariatric diet. Follow nature of JP output CKD - Cr better today. Follow trend Hyperkalemia - removed kcl from fluid VTe prophylaxis - cont subcu heparin, ambulate, scd,  Heme - trend cbc  tx to floor.  Hopefully home Thursday Appreciate CCM assist  Mary Sellaric M. Andrey CampanileWilson, MD, FACS General, Bariatric, & Minimally Invasive Surgery [facility] [place]   LOS: 1 day    Atilano InaWILSON,Tremayne Sheldon M 06/05/2014

## 2014-06-05 NOTE — Progress Notes (Signed)
PULMONARY / CRITICAL CARE MEDICINE   Name: [name] MRN: [id number] DOB: 12/23/1945    ADMISSION DATE:  06/04/2014 CONSULTATION DATE:  06/04/14  REFERRING MD :  Dr. Andrey CampanileWilson   CHIEF COMPLAINT:  Hypotension   INITIAL PRESENTATION: 68 y/o F admitted 12/22 for planned removal of lapband with conversion to sleeve gastrectomy.  She developed hypotension & bradycardia in OR and gastric sleeve resection was aborted and patient returned to ICU.  PCCM consulted for evaluation.    STUDIES:  12/22 ECHO >> EF 55-60%, nml wall motion, grade 1 DD, small pericardial effusion without tamponade   SIGNIFICANT EVENTS: 12/22  Admit for planned removal of lapband with conversion to sleeve gastrectomy, hypotensive/bradycardic in OR and gastrectomy aborted 12/23  Hypotension resolved.     SUBJECTIVE: Pt denies acute complaints, no events overnight.  Pending tx to Med-Surg.  Dopamine gtt off, BP stable  VITAL SIGNS: Temp:  [97.3 F (36.3 C)-98.9 F (37.2 C)] 98.9 F (37.2 C) (12/23 0830) Pulse Rate:  [29-89] 63 (12/23 0500) Resp:  [10-30] 10 (12/23 0500) BP: (77-157)/(37-100) 145/71 mmHg (12/23 0500) SpO2:  [83 %-100 %] 97 % (12/23 0500) FiO2 (%):  [100 %] 100 % (12/22 1010) Weight:  [249 lb 5.4 oz (113.1 kg)] 249 lb 5.4 oz (113.1 kg) (12/22 1115)   HEMODYNAMICS:     VENTILATOR SETTINGS: Vent Mode:  [-]  FiO2 (%):  [100 %] 100 %   INTAKE / OUTPUT:  Intake/Output Summary (Last 24 hours) at 06/05/14 0919 Last data filed at 06/05/14 0500  Gross per 24 hour  Intake 3840.42 ml  Output   3230 ml  Net 610.42 ml    PHYSICAL EXAMINATION: General:  Obese adult female in NAD, pleasant Neuro:  AAOx4, speech clear, MAE HEENT:  MM pink/dry, short neck, no jvd noted  Cardiovascular:  s1s2 rrr, no m/r/g Lungs:  resp's even/non-labored on [place] O2, lungs bilaterally clear  Abdomen:  Obese, soft, bsx4 active.  JP drain LL with serosanguinous drainage, lap incisions c/d/i, no drainage from sites   Musculoskeletal:  No acute deformities  Skin:  Warm/dry, no edema   LABS:  CBC  Recent Labs Lab 05/30/14 1030 06/04/14 1012 06/05/14 0545  WBC 7.2  --  13.5*  HGB 14.0 13.0 12.2  HCT 44.6 39.2 37.9  PLT 245  --  197   BMET  Recent Labs Lab 05/30/14 1030 06/04/14 0730 06/04/14 1012 06/05/14 0545  NA 139  --  139 143  K 4.7  --  3.8 5.2*  CL 102  --  105 112  CO2 26  --  25 26  BUN 27*  --  25* 17  CREATININE 1.31* 1.38* 1.54* 1.11*  GLUCOSE 88  --  173* 142*   Electrolytes  Recent Labs Lab 05/30/14 1030 06/04/14 1012 06/05/14 0545  CALCIUM 10.3 9.7 9.4    Liver Enzymes  Recent Labs Lab 05/30/14 1030 06/05/14 0545  AST 25 38*  ALT 26 32  ALKPHOS 107 72  BILITOT 0.4 0.3  ALBUMIN 4.0 3.7   Cardiac Enzymes  Recent Labs Lab 06/04/14 1012 06/04/14 1725 06/04/14 2310  TROPONINI <0.03 <0.03 <0.03   Imaging No results found.   ASSESSMENT / PLAN:  CARDIOVASCULAR CVL A:  Hypotension - required fluids (approx 3L ), epi, neo, atropine, dopamine during case, BP in 70's.  Office records reflect 120's systolic, 100 mg cortisone at end of case Bradycardia - ECHO wnl, troponin negative  CAD HTN (hx) HLD P:  Hold  home regimen:  Norvasc, crestor, ASA.  Follow BP trend, she may not need at discharge.   S/p solu-cortef in OR 100 mg ECHO reviewed, see above   PULMONARY OETT A: At Risk Post-Op Atx P:   IS / Pulmonary hygiene  RENAL A:   CKD - baseline sr cr ~ 1.3 Questionable hx of Renal Cell Carcinoma s/p Nephrectomy - surgery completed in in [place], final path did not mention carcinoma P:   Ensure adequate perfusion / hydration   Trend BMP  D5 1/2 NS with 20 mEq KCL @ 50 ml/hr  GASTROINTESTINAL A:   Morbid Obesity - s/p lap band removal.  Lowest weight 210.   P:   Defer diet to CCS  HEMATOLOGIC A:   No acute issues P:  Monitor CBC  INFECTIOUS A:   Post-Op Lap Band Removal - no indication of acute infection.  P:   Monitor fever  curve / WBC trend  ENDOCRINE A:   Hypothyroidism - mild elevation of TSH (7.552) At risk Hypoglycemia  P:   Follow up TSH with PCP  Resume synthroid, IV dosing for now.  Transition back to PO once tolerating oral.   NEUROLOGIC A:   Pain - post op removal of lap band P:   Morphine 2-4 mg Q2 PRN pain  Minimize sedating medications as able    FAMILY  - Updates: Husband updated at bedside.    PCCM will sign off, please call back if needs arise.    Canary BrimBrandi Ollis, NP-C [facility] Pgr: [phone] or [phone]    06/05/2014, 9:19 AM   Attending:  I have seen and examined the patient with nurse practitioner/resident and agree with the note above.   On exam today she looks great, lungs clear, heart exam WNL.  I'm not sure what to make of the abnormal TSH in the setting of her shock.  This would be an unusual explanation for shock, and interpretation of the lab needs to be cautious in the setting of shock.  Recommend that she have a TSH repeated in 2 weeks with outpatient MD prior to adjusting her synthroid.  Heber CarolinaBrent Ulises Wolfinger, MD [place] [facility] Pager: [phone] Cell: [phone] If no response, call [phone]

## 2014-06-05 NOTE — Progress Notes (Signed)
Reviewed diet progression with patient, including the following information.  All questions answered, will continue to monitor.   POST-OP DIET - PHASE II - LIQUIDS - DISCHARGE DAY- 1 WEEK POST-OP   Protein Drink AT LEAST 2 ounces of your high protein shake, 5-6 times per day      (Consume a minimum of 10-12 ounces per day) INCREASE THE AMOUNT OF PROTEIN SHAKE YOU DRINK AS TOLERATED Protein powder may be added to fluids such as non-fat milk, unflavored soy milk, or Lactaid milk (limit to 20 grams added protein powder per serving) Hydration Gradually increase the amount of water and other liquids as tolerated (see acceptable fluids) Gradually increase the amount of protein shake as tolerated  FLUID GOALS AT LEAST 64 ounces or more of fluids daily:  32 ounces (or more) clear liquids + 32 ounces (or more) full liquids  **ALL FLUIDS NEED TO BE SUGAR-FREE, CAFFEINE-FREE, AND NON-CARBONATED** Acceptable Fluids  CLEAR LIQUIDS        FULL LIQUIDS                       Water, sugar-free flavored water (i.e. Propel Zero)    Protein shakes Decaf coffee or tea        PLUS 2 other choices below: Crystal Light, Wyler's Light, Sugar-free drink mixes    Limit 2 choices per day Sugar-free Jell-O                                         No more than 3 g fat/serv. Broth or bullion        No more than 12 g carb/serv. Sugar-free popsicle (less than 20 calories,     - Strained, low-fat creamed soups   *limit 1 per day)                    - Non-fat/skim milk                                    - Sugar-free yogurt (blended)    Phase III B  High protein + Vegetables Start at 1 week post op  Protein Protein goal: 60 grams for women / 80 grams for men Slowly begin replacing protein shakes with soft moist proteins (as tolerated) Chew food thoroughly Take small bites (about the size of a dime) Eat from small plates and use small utensils to help control portions Eat at least 3 meals per day      -2-3 ounces or  one protein drink Add snacks as needed     -Listen to your body     -Eat every 3-5 hours     -Consume 1-2 ounces  *Avoid all STARCHES (bread, cereal, rice, pasta, potatoes, corn, peas, sweets), FRUIT *Your body is not ready to digest these foods.  Fluids Fluid goal: 64 ounces+ per day Increase the amounts of clear/full liquids as tolerated No fluids 15 minutes before, during, and 30 minutes after meals All fluids should be caffeine-free, non-carbonated, and sugar-free  Daily Exercise Gradually increase physical activity levels as you are able, per your doctors recommendations Try to be as active as you can!      Type of Protein  Grams Protein in 2 oz    Cheese (low-fat preferred)                                              14   Cottage Cheese                                                              14   Eggs, 2 (consume <5 yolks/week)                                 12   Egg Beaters                                                                      7   Fish                                                                                 14   Shellfish                                                                          14   Chicken                                                                           14   [place]key                                                                             14   Deli Meat (low-fat preferred)  14   Lean Pork                                                                        14   Lean Beef (ground)                                                          14   Skim Milk                                                3   Soy or Almond Milk (unflavored)                                        3   Greek Yogurt (<12g carbohydrate or less)                     10   Yogurt (<12g carbohydrate or less)                                  3-5   Beans or Lentils  (dried or canned beans) (1/2 cup)          7-8   Green Beans, Summer Squash, Zucchini (1/2 cup)           2  *All proteins must be BAKED, BROILED, GRILLED, STEAMED, OR SAUTEED.   *No breaded or fried proteins. *Do not add flour based gravy to protein. *Do not add any pieces or chunks of any fruit/vegetable to foods    Helpful Hints  FOOD LABELS Educate yourself on label reading. Many food manufacturer's will add "hidden" sources of carbohydrate and sugar. Do not believe health claims. Many products advertise "Low-Carb" when they actually aren't. Keep carbohydrates <12g/serving.                                                                                                                            Non-Starchy Vegetable List       Artichoke           Mixed vegetables (without corn, peas, pasta)        Artichoke hearts  Mushrooms        Asparagus             Okra       Beans (green, wax, [place])          Onions       Bean sprouts            Pea pods       Beets             Peppers       Broccoli             Radishes       Brussels sprouts            Salad greens (anything EXCEPT iceberg)       Cabbage             Sauerkraut       Carrots                                                    Spinach       Cauliflower             Summer squash       Celery                                   Tomato (fresh or canned)       Cucumber             Tomato sauce       Eggplant             Tomato/vegetable juice       Green onions/scallions           Turnips       Winn[employer]Dixie chestnuts        Kohlrabi             Watercress       Leeks             Zucchini   Helpful Hints  COOK  All vegetables should be incorporated in well-cooked, chopped, and peeled when initially added back into your diet.  BEWARE  Beware stringy vegetables as they may not be digested well. Asparagus, celery, salad greens. You may want to start by cooking, chopping, blending these  vegetables when you initially start eating them.  FOOD LABELS Read all food labels to ensure that carbohydrates are less than <12 grams/serving. Some soups, tomato sauces, and restaurants add "hidden carbohydrates" to these foods.   BEWARE Be aware of sugar alcohols in your food/beverages. Mannitol, Sorbitol, Xylitol, etc can cause gas pain and diarrhea because we do not absorb them well.  ADD MOISTURE  Dry meat/protein will not be well tolerated. Cook food until soft and tender. You may need to add water, broth,  or low-fat cream soups to foods while cooking. You may also use low-fat salad dressings, low-fat mayonnaise, or No Added Sugar BBQ or ketchup. Beware of teriyaki, Polynesian, sweet & sour, etc sauces as they will contain large amounts of sugar.

## 2014-06-05 NOTE — Discharge Instructions (Signed)
POST-OP DIET - PHASE II - LIQUIDS - DISCHARGE DAY- 1 WEEK POST-OP   Protein Drink AT LEAST 2 ounces of your high protein shake, 5-6 times per day      (Consume a minimum of 10-12 ounces per day) INCREASE THE AMOUNT OF PROTEIN SHAKE YOU DRINK AS TOLERATED Protein powder may be added to fluids such as non-fat milk, unflavored soy milk, or Lactaid milk (limit to 20 grams added protein powder per serving) Hydration Gradually increase the amount of water and other liquids as tolerated (see acceptable fluids) Gradually increase the amount of protein shake as tolerated  FLUID GOALS AT LEAST 64 ounces or more of fluids daily:  32 ounces (or more) clear liquids + 32 ounces (or more) full liquids  **ALL FLUIDS NEED TO BE SUGAR-FREE, CAFFEINE-FREE, AND NON-CARBONATED** Acceptable Fluids  CLEAR LIQUIDS        FULL LIQUIDS                       Water, sugar-free flavored water (i.e. Propel Zero)    Protein shakes Decaf coffee or tea        PLUS 2 other choices below: Crystal Light, [place] Light, Sugar-free drink mixes    Limit 2 choices per day Sugar-free Jell-O                                         No more than 3 g fat/serv. Broth or bullion        No more than 12 g carb/serv. Sugar-free popsicle (less than 20 calories,     - Strained, low-fat creamed soups   *limit 1 per day)                    - Non-fat/skim milk                                    - Sugar-free yogurt (blended)    Phase III B  High protein + Vegetables Start at 1 week post op  Protein Protein goal: 60 grams for women / 80 grams for men Slowly begin replacing protein shakes with soft moist proteins (as tolerated) Chew food thoroughly Take small bites (about the size of a dime) Eat from small plates and use small utensils to help control portions Eat at least 3 meals per day      -2-3 ounces or one protein drink Add snacks as needed     -Listen to your body     -Eat every 3-5 hours     -Consume 1-2 ounces  *Avoid all  STARCHES (bread, cereal, rice, pasta, potatoes, corn, peas, sweets), FRUIT *Your body is not ready to digest these foods.  Fluids Fluid goal: 64 ounces+ per day Increase the amounts of clear/full liquids as tolerated No fluids 15 minutes before, during, and 30 minutes after meals All fluids should be caffeine-free, non-carbonated, and sugar-free  Daily Exercise Gradually increase physical activity levels as you are able, per your doctors recommendations Try to be as active as you can!      Type of Protein  Grams Protein in 2 oz    Cheese (low-fat preferred)                                              14   Cottage Cheese                                                              14   Eggs, 2 (consume <5 yolks/week)                                 12   Egg Beaters                                                                      7   Fish                                                                                 14   Shellfish                                                                          14   Chicken                                                                           14   [place]key                                                                             14   Deli Meat (low-fat preferred)  14   Lean Pork                                                                        14   Lean Beef (ground)                                                          14   Skim Milk                                                3   Soy or Almond Milk (unflavored)                                        3   Greek Yogurt (<12g carbohydrate or less)                     10   Yogurt (<12g carbohydrate or less)                                  3-5   Beans or Lentils (dried or canned beans) (1/2 cup)          7-8   Green Beans, Summer Squash, Zucchini (1/2 cup)           2  *All proteins must be  BAKED, BROILED, GRILLED, STEAMED, OR SAUTEED.   *No breaded or fried proteins. *Do not add flour based gravy to protein. *Do not add any pieces or chunks of any fruit/vegetable to foods    Helpful Hints  FOOD LABELS Educate yourself on label reading. Many food manufacturers will add hidden sources of carbohydrate and sugar. Do not believe health claims. Many products advertise Low-Carb when they actually arent. Keep carbohydrates <12g/serving.                                                                                                                            Non-Starchy Vegetable List       Artichoke           Mixed vegetables (without corn, peas, pasta)        Artichoke hearts  Mushrooms        Asparagus             Okra       Beans (green, wax, [place])          Onions       Bean sprouts            Pea pods       Beets             Peppers       Broccoli             Radishes       Brussels sprouts            Salad greens (anything EXCEPT iceberg)       Cabbage             Sauerkraut       Carrots                                                    Spinach       Cauliflower             Summer squash       Celery                                   Tomato (fresh or canned)       Cucumber             Tomato sauce       Eggplant             Tomato/vegetable juice       Green onions/scallions           Turnips       Winn[employer]Dixie chestnuts        Kohlrabi             Watercress       Leeks             Zucchini   Helpful Hints  COOK  All vegetables should be incorporated in well-cooked, chopped, and peeled when initially added back into your diet.  BEWARE  Beware stringy vegetables as they may not be digested well. Asparagus, celery, salad greens. You may want to start by cooking, chopping, blending these vegetables when you initially start eating them.  FOOD LABELS Read all food labels to ensure that carbohydrates are less than <12  grams/serving. Some soups, tomato sauces, and restaurants add hidden carbohydrates to these foods.   BEWARE Be aware of sugar alcohols in your food/beverages. Mannitol, Sorbitol, Xylitol, etc can cause gas pain and diarrhea because we do not absorb them well.  ADD MOISTURE  Dry meat/protein will not be well tolerated. Cook food until soft and tender. You may need to add water, broth,  or low-fat cream soups to foods while cooking. You may also use low-fat salad dressings, low-fat mayonnaise, or No Added Sugar BBQ or ketchup. Beware of teriyaki, Polynesian, sweet & sour, etc sauces as they will contain large amounts of sugar.

## 2014-06-06 ENCOUNTER — Ambulatory Visit: Payer: Medicare Other | Admitting: Dietician

## 2014-06-06 LAB — CBC WITH DIFFERENTIAL/PLATELET
BASOS PCT: 0 % (ref 0–1)
Basophils Absolute: 0 10*3/uL (ref 0.0–0.1)
EOS ABS: 0 10*3/uL (ref 0.0–0.7)
Eosinophils Relative: 0 % (ref 0–5)
HEMATOCRIT: 38.8 % (ref 36.0–46.0)
Hemoglobin: 12.6 g/dL (ref 12.0–15.0)
Lymphocytes Relative: 20 % (ref 12–46)
Lymphs Abs: 2.5 10*3/uL (ref 0.7–4.0)
MCH: 29.3 pg (ref 26.0–34.0)
MCHC: 32.5 g/dL (ref 30.0–36.0)
MCV: 90.2 fL (ref 78.0–100.0)
MONO ABS: 1 10*3/uL (ref 0.1–1.0)
Monocytes Relative: 8 % (ref 3–12)
Neutro Abs: 9.3 10*3/uL — ABNORMAL HIGH (ref 1.7–7.7)
Neutrophils Relative %: 72 % (ref 43–77)
Platelets: 206 10*3/uL (ref 150–400)
RBC: 4.3 MIL/uL (ref 3.87–5.11)
RDW: 13.2 % (ref 11.5–15.5)
WBC: 12.8 10*3/uL — ABNORMAL HIGH (ref 4.0–10.5)

## 2014-06-06 LAB — BASIC METABOLIC PANEL
ANION GAP: 3 — AB (ref 5–15)
BUN: 22 mg/dL (ref 6–23)
CALCIUM: 9.6 mg/dL (ref 8.4–10.5)
CO2: 32 mmol/L (ref 19–32)
CREATININE: 1.01 mg/dL (ref 0.50–1.10)
Chloride: 106 mEq/L (ref 96–112)
GFR, EST AFRICAN AMERICAN: 65 mL/min — AB (ref >90)
GFR, EST NON AFRICAN AMERICAN: 56 mL/min — AB (ref >90)
Glucose, Bld: 92 mg/dL (ref 70–99)
Potassium: 4.4 mmol/L (ref 3.5–5.1)
Sodium: 141 mmol/L (ref 135–145)

## 2014-06-06 MED ORDER — ACETAMINOPHEN 160 MG/5ML PO SOLN: 650.0000 mg | ORAL | Status: DC | PRN

## 2014-06-06 NOTE — Clinical Documentation Improvement (Signed)
Current note reflects CKD.  Please specify the CKD stage if known and document in your progress note and discharge summary.  Also creatinine 1.54 12/22 and now 1.01 12/24.  Please document any additional clinical conditions associated with the creatinine, if any, in your progress note and discharge summary.    CKD: -CKD Stage II - GFR 60-80 -CKD Stage III - GFR 30-59 -CKD Stage IV - GFR 15-29   Possible Clinical Conditions: -Acute kidney injury on chronic kidney disease -Other condition -chronic kidney disease only (as currently documented)  Component      BUN Creatinine  Latest Ref Rng      6 - 23 mg/dL 1.610.50 - 0.961.10 mg/dL  04/54/098112/22/2015     1:917:30 AM  1.38 (H)  06/04/2014     10:12 AM 25 (H) 1.54 (H)  06/05/2014     5:45 AM 17 1.11 (H)  06/06/2014      22 1.01   Component      GFR calc non Af Amer  Latest Ref Rng      >90 mL/min  06/04/2014     7:30 AM 39 (L)  06/04/2014     10:12 AM 34 (L)  06/05/2014     5:45 AM 50 (L)  06/06/2014      56 (L)   Thank you, Doy MinceVangela Arnaldo Heffron, RN [phone] Clinical Documentation Specialist

## 2014-06-06 NOTE — Progress Notes (Addendum)
2 Days Post-Op  Subjective: She looks good and is already on full liquids.  The only question with her is the drain.  I will need to discuss with Dr. Ezzard StandingNewman before discharge.  Objective: Vital signs in last 24 hours: Temp:  [98 F (36.7 C)-98.6 F (37 C)] 98.5 F (36.9 C) (12/24 0516) Pulse Rate:  [18-80] 18 (12/24 0516) Resp:  [18] 18 (12/24 0516) BP: (116-158)/(62-78) 158/62 mmHg (12/24 0516) SpO2:  [92 %-99 %] 97 % (12/24 0516)  320 PO, Bariatric full liquids Drain 150 ml Afebrile, VSS WBC is still up at 12.8 Intake/Output from previous day: 12/23 0701 - 12/24 0700 In: 1436.7 [P.O.:320; I.V.:1116.7] Out: 4550 [Urine:4400; Drains:150] Intake/Output this shift:    General appearance: alert, cooperative and no distress GI: soft, a little sore, drain is clear, sites all look good.   Lab Results:   Recent Labs  06/05/14 0545 06/05/14 1632 06/06/14 0515  WBC 13.5*  --  12.8*  HGB 12.2 12.9 12.6  HCT 37.9 39.7 38.8  PLT 197  --  206    BMET  Recent Labs  06/05/14 0545 06/06/14 0515  NA 143 141  K 5.2* 4.4  CL 112 106  CO2 26 32  GLUCOSE 142* 92  BUN 17 22  CREATININE 1.11* 1.01  CALCIUM 9.4 9.6   PT/INR No results for input(s): LABPROT, INR in the last 72 hours.   Recent Labs Lab 05/30/14 1030 06/05/14 0545  AST 25 38*  ALT 26 32  ALKPHOS 107 72  BILITOT 0.4 0.3  PROT 7.6 6.5  ALBUMIN 4.0 3.7     Lipase  No results found for: LIPASE   Studies/Results: No results found.  Medications: . antiseptic oral rinse  7 mL Mouth Rinse BID  . heparin subcutaneous  5,000 Units Subcutaneous 3 times per day  . levothyroxine  37.5 mcg Intravenous Daily  . pantoprazole (PROTONIX) IV  40 mg Intravenous QHS  . protein supplement  2 oz Oral QID   Or  . protein supplement  2 oz Oral QID   Or  . protein supplement  2 oz Oral QID    Assessment/Plan A.  Obesity, BMI 37, s/p 1. Laparoscopic lysis of adhesions. 2. Laparoscopic removal of adjustable gastric  band. 3. Laparoscopic gastric wedge resection. 4. Upper endoscopy, 06/04/14,  Mary SellaEric M. Andrey CampanileWilson, MD. B.  Intraoperative bradycardia and hypotension C.  Multiple abdominal surgeries; cholecystectomy, appendectomy, nephrectomy left, VHR with mesh, prior lap band. D.  Hypertension E.  Hypothyroid F.  CAD G.  Dyslipidemia    Plan:  Home later after decision on the drain.   LOS: 2 days    JENNINGS,WILLARD 06/06/2014  Agree with above.  Ovidio Kinavid Harnoor Reta, MD, [facility] Pager: [phone] Office phone:  [phone]

## 2014-06-18 ENCOUNTER — Ambulatory Visit: Payer: Medicare Other

## 2014-06-28 NOTE — Discharge Summary (Signed)
Physician Discharge Summary  [name] NUU:[id number] DOB: 1946-01-16 DOA: 06/04/2014  PCP: Ernestene Kiel, MD  Admit date: 06/04/2014 Discharge date: 06/07/2015  Recommendations for Outpatient Follow-up:  1. Follow up with Dr Gayland Curry within 2 weeks 2. Follow up with PCP within 2 weeks   Discharge Diagnoses:  Active Problems:   Hypothyroidism   HYPERTENSION, BENIGN ESSENTIAL   Coronary atherosclerosis   H/O laparoscopic adjustable gastric banding 05/22/2012   S/p nephrectomy- Lt 2011    Morbid obesity Hypotension - resolved S/p Laparoscopic lysis of adhesions, laparoscopic removal of adjustable gastric band, laparoscopic gastric wedge resection  Surgical Procedure: S/p Laparoscopic lysis of adhesions, laparoscopic removal of adjustable gastric band, laparoscopic gastric wedge resection 06/04/14  Discharge Condition: fair Disposition: home  Diet recommendation: bariatric full liquid diet  Filed Weights   06/04/14 0505 06/04/14 1115  Weight: 235 lb (106.595 kg) 249 lb 5.4 oz (113.1 kg)    History of present illness: Patient comes in for a planned laparoscopic removal of her adjustable gastric band with conversion to sleeve gastrectomy. She was having ongoing difficulty with tolerating her lap band despite extensive education and confirming that there is no evidence of band slippage.   Hospital Course:  Patient received 5000 units of subcutaneous heparin preoperatively she was then taken to the operating room at [facility]. Please see operative note for further details. During surgery the patient was hypotensive. She received IV fluids. She was started on a dopamine drip. Her blood pressure increased and normalize while on the dopamine drip but required it throughout the case. There was no excessive bleeding. She did have adhesions to her old ventral mesh which were taken down. She also had the typical capsule around the previously placed lap band. In  taking down and removing the imbrication sutures of the stomach around the adjustable gastric band, the stomach was entered. This resulted in about a 1-1/2 cm to 2 cm defect in the stomach along the greater curvature near the fundus. It was repaired by lifting up the stomach in this area and firing a stapler resulting in a small wedge resection. We did not proceed with conversion to a sleeve gastrectomy for several reasons. She required ongoing vasopressor requirement despite adequate fluid resuscitation without any evidence of bleeding. It was unclear why she was requiring ongoing dopamine infusion. We had also had removed a small portion of the stomach. And her baseline creatinine was slightly elevated. With her baseline creatinine already slightly elevated with her the vasopressor and her only having one kidney I did not feel it was safe to proceed with the more extensive surgery. Therefore we completed removal of the lap band and woke patient up. She was first taken to the recovery room. An EKG and labs were obtained. There is no evidence of EKG changes. Her troponin was negative. Despite no longer being on general anesthesia she still required dopamine infusion to maintain a normal blood pressure. Therefore she was transported to the stepdown unit and critical care medicine consult was obtained. Throughout the rest of the day she was slowly able to be weaned off the dopamine. Her normal dosage of Norvasc was held while she was in the hospital. She was started on a liquid diet on postop day 1. We continue DVT prophylaxis with Lovenox. Her creatinine normalized to her baseline. In reviewing the case with critical care medicine and anesthesia, we felt that the reason she was hypotensive was the fact that she took all of her mood  stabilizing medication the morning of surgery which were norepinephrine blockers. On postoperative day 2 she was tolerating a diet. There is no evidence since of leakage from her stomach  so that the surgical drain was removed. She received discharge instructions. Her blood pressure medicines held on discharge. She was angulating without difficulty. She had minimal pain. It was felt she was stable for discharge   Discharge Instructions     Medication List    TAKE these medications        acetaminophen 160 MG/5ML solution  Commonly known as:  TYLENOL  Take 20.3 mLs (650 mg total) by mouth every 4 (four) hours as needed for mild pain or headache.     amLODipine 5 MG tablet  Commonly known as:  NORVASC  Take 1 tablet (5 mg total) by mouth daily.     aspirin 81 MG EC tablet  Take 81 mg by mouth every morning.     buPROPion 75 MG tablet  Commonly known as:  WELLBUTRIN  Take 1 tablet (75 mg total) by mouth 2 (two) times daily.     CALCIUM CITRATE PO  Take 1 tablet by mouth 3 (three) times daily.     CALTRATE 600+D 600-400 MG-UNIT per tablet  Generic drug:  Calcium Carbonate-Vitamin D  Take 1 tablet by mouth every morning.     clonazePAM 2 MG tablet  Commonly known as:  KLONOPIN  Take 2 mg by mouth at bedtime.     dextromethorphan-guaiFENesin 30-600 MG per 12 hr tablet  Commonly known as:  MUCINEX DM  Take 2 tablets by mouth 2 (two) times daily.     doxepin 25 MG capsule  Commonly known as:  SINEQUAN  Take 50 mg by mouth every morning.     DULoxetine 30 MG capsule  Commonly known as:  CYMBALTA  Take 30 mg by mouth 2 (two) times daily.     levothyroxine 75 MCG tablet  Commonly known as:  SYNTHROID, LEVOTHROID  Take 75 mcg by mouth daily before breakfast. Pt gets brand name     multivitamin tablet  Take 1 tablet by mouth every morning.     OXYCODONE HCL PO  Take 5-10 mLs by mouth every 4 (four) hours as needed (pain). $RemoveBefore'5mg'[id number]$ /26ml     pregabalin 75 MG capsule  Commonly known as:  LYRICA  Take 75 mg by mouth 2 (two) times daily.     rosuvastatin 20 MG tablet  Commonly known as:  CRESTOR  Take 20 mg by mouth at bedtime.     vitamin B-12 1000 MCG  tablet  Commonly known as:  CYANOCOBALAMIN  Take 1,000 mcg by mouth every morning.     Vitamin D3 1000 UNITS Caps  Take 1 capsule by mouth daily after lunch.          The results of significant diagnostics from this hospitalization (including imaging, microbiology, ancillary and laboratory) are listed below for reference.    Significant Diagnostic Studies: No results found.  Microbiology: No results found for this or any previous visit (from the past 240 hour(s)).   Labs: Basic Metabolic Panel: BMP Latest Ref Rng 06/06/2014 06/05/2014 06/04/2014  Glucose 70 - 99 mg/dL 92 142(H) 173(H)  BUN 6 - 23 mg/dL 22 17 25(H)  Creatinine 0.50 - 1.10 mg/dL 1.01 1.11(H) 1.54(H)  Sodium 135 - 145 mmol/L 141 143 139  Potassium 3.5 - 5.1 mmol/L 4.4 5.2(H) 3.8  Chloride 96 - 112 mEq/L 106 112 105  CO2 19 - 32  mmol/L 32 26 25  Calcium 8.4 - 10.5 mg/dL 9.6 9.4 9.7    Liver Function Tests: Hepatic Function Latest Ref Rng 06/05/2014 05/30/2014 06/04/2012  Total Protein 6.0 - 8.3 g/dL 6.5 7.6 -  Albumin 3.5 - 5.2 g/dL 3.7 4.0 3.0(L)  AST 0 - 37 U/L 38(H) 25 -  ALT 0 - 35 U/L 32 26 -  Alk Phosphatase 39 - 117 U/L 72 107 -  Total Bilirubin 0.3 - 1.2 mg/dL 0.3 0.4 -   CBC: CBC Latest Ref Rng 06/06/2014 06/05/2014 06/05/2014  WBC 4.0 - 10.5 K/uL 12.8(H) - 13.5(H)  Hemoglobin 12.0 - 15.0 g/dL 12.6 12.9 12.2  Hematocrit 36.0 - 46.0 % 38.8 39.7 37.9  Platelets 150 - 400 K/uL 206 - 197      Active Problems:   Hypothyroidism   HYPERTENSION, BENIGN ESSENTIAL   Coronary atherosclerosis   H/O laparoscopic adjustable gastric banding 05/22/2012   S/p nephrectomy- Lt 2011    Morbid obesity  S/p Laparoscopic lysis of adhesions, laparoscopic removal of adjustable gastric band, laparoscopic gastric wedge resection 06/04/14   Time coordinating discharge: 15 min  Signed:  Gayland Curry, MD [facility], [place] [phone] 06/28/2014, 9:18 AM

## 2014-07-05 ENCOUNTER — Ambulatory Visit: Payer: Medicare Other | Admitting: Dietician

## 2014-07-08 ENCOUNTER — Other Ambulatory Visit: Payer: Self-pay | Admitting: General Surgery

## 2014-07-08 DIAGNOSIS — Z9884 Bariatric surgery status: Secondary | ICD-10-CM

## 2014-07-15 ENCOUNTER — Other Ambulatory Visit: Payer: Medicare Other

## 2014-07-16 ENCOUNTER — Other Ambulatory Visit: Payer: Self-pay | Admitting: General Surgery

## 2014-07-16 ENCOUNTER — Ambulatory Visit
Admission: RE | Admit: 2014-07-16 | Discharge: 2014-07-16 | Disposition: A | Discharge status: Home / Self Care | Payer: Medicare Other | Source: Ambulatory Visit | Attending: General Surgery | Admitting: General Surgery

## 2014-07-16 DIAGNOSIS — Z9884 Bariatric surgery status: Secondary | ICD-10-CM

## 2014-08-05 ENCOUNTER — Ambulatory Visit: Payer: Medicare Other | Admitting: Dietician

## 2014-08-05 ENCOUNTER — Ambulatory Visit (INDEPENDENT_AMBULATORY_CARE_PROVIDER_SITE_OTHER): Payer: Self-pay | Admitting: General Surgery

## 2014-08-13 ENCOUNTER — Ambulatory Visit: Payer: Medicare Other | Admitting: Cardiology

## 2014-08-22 ENCOUNTER — Encounter (HOSPITAL_COMMUNITY): Payer: Self-pay

## 2014-08-22 ENCOUNTER — Telehealth (INDEPENDENT_AMBULATORY_CARE_PROVIDER_SITE_OTHER): Payer: Self-pay | Admitting: General Surgery

## 2014-08-22 ENCOUNTER — Encounter (HOSPITAL_COMMUNITY)
Admission: RE | Admit: 2014-08-22 | Discharge: 2014-08-22 | Disposition: A | Discharge status: Home / Self Care | Payer: Medicare Other | Source: Ambulatory Visit | Attending: General Surgery | Admitting: General Surgery

## 2014-08-22 DIAGNOSIS — Z01818 Encounter for other preprocedural examination: Secondary | ICD-10-CM | POA: Diagnosis present

## 2014-08-22 LAB — CBC WITH DIFFERENTIAL/PLATELET
Basophils Absolute: 0 10*3/uL (ref 0.0–0.1)
Basophils Relative: 0 % (ref 0–1)
Eosinophils Absolute: 0.2 10*3/uL (ref 0.0–0.7)
Eosinophils Relative: 4 % (ref 0–5)
HCT: 56.4 % — ABNORMAL HIGH (ref 36.0–46.0)
Hemoglobin: 19.2 g/dL — ABNORMAL HIGH (ref 12.0–15.0)
Lymphocytes Relative: 30 % (ref 12–46)
Lymphs Abs: 1.6 10*3/uL (ref 0.7–4.0)
MCH: 30.7 pg (ref 26.0–34.0)
MCHC: 34 g/dL (ref 30.0–36.0)
MCV: 90.1 fL (ref 78.0–100.0)
Monocytes Absolute: 0.3 10*3/uL (ref 0.1–1.0)
Monocytes Relative: 5 % (ref 3–12)
Neutro Abs: 3.3 10*3/uL (ref 1.7–7.7)
Neutrophils Relative %: 61 % (ref 43–77)
Platelets: 120 10*3/uL — ABNORMAL LOW (ref 150–400)
RBC: 6.26 MIL/uL — ABNORMAL HIGH (ref 3.87–5.11)
RDW: 13.2 % (ref 11.5–15.5)
WBC: 5.4 10*3/uL (ref 4.0–10.5)

## 2014-08-22 LAB — COMPREHENSIVE METABOLIC PANEL
ALT: 22 U/L (ref 0–35)
ANION GAP: 9 (ref 5–15)
AST: 24 U/L (ref 0–37)
Albumin: 4.4 g/dL (ref 3.5–5.2)
Alkaline Phosphatase: 80 U/L (ref 39–117)
BILIRUBIN TOTAL: 0.7 mg/dL (ref 0.3–1.2)
BUN: 28 mg/dL — AB (ref 6–23)
CALCIUM: 10.1 mg/dL (ref 8.4–10.5)
CO2: 28 mmol/L (ref 19–32)
CREATININE: 1.39 mg/dL — AB (ref 0.50–1.10)
Chloride: 107 mmol/L (ref 96–112)
GFR calc Af Amer: 44 mL/min — ABNORMAL LOW (ref >90)
GFR calc non Af Amer: 38 mL/min — ABNORMAL LOW (ref >90)
GLUCOSE: 99 mg/dL (ref 70–99)
Potassium: 4.3 mmol/L (ref 3.5–5.1)
Sodium: 144 mmol/L (ref 135–145)
Total Protein: 7.2 g/dL (ref 6.0–8.3)

## 2014-08-22 NOTE — Telephone Encounter (Signed)
Called pt regarding her labs. She really wasn't fasting for her preop labs today. Had had a glass of water after midnight and a protein shake. Reports normal appetite and fluid intake recently. Cr slightly elevated but hgb/hct up about 4 pts. Told her we needed to repeat her labs Friday to see what they are. Advised her my office would call her in am on how to get labs re-drawn tomorrow.

## 2014-08-22 NOTE — Patient Instructions (Signed)
[name]  08/22/2014   Your procedure is scheduled on: Tuesday 08/27/2014  [facility] and follow signs to               Short Stay Center at 0800 AM.  Call this number if you have problems the morning of surgery [phone]   Remember:  Do not eat food or drink liquids :After Midnight.     Take these medicines the morning of surgery with A SIP OF WATER: Amlodipine, Levothyroxine                               You may not have any metal on your body including hair pins and              piercings  Do not wear jewelry, make-up, lotions, powders or perfumes.             Do not wear nail polish.  Do not shave  48 hours prior to surgery.              Men may shave face and neck.   Do not bring valuables to the hospital. [facility] IS NOT             RESPONSIBLE   FOR VALUABLES.  Contacts, dentures or bridgework may not be worn into surgery.  Leave suitcase in the car. After surgery it may be brought to your room.     Patients discharged the day of surgery will not be allowed to drive home.  Name and phone number of your driver:  Special Instructions: N/A              Please read over the following fact sheets you were given: _____________________________________________________________________             [facility] - Preparing [facility] Before surgery, you can play an important role.  Because skin is not sterile, your skin needs to be as free of germs as possible.  You can reduce the number of germs on your skin by washing with CHG (chlorahexidine gluconate) soap before surgery.  CHG is an antiseptic cleaner which kills germs and bonds with the skin to continue killing germs even after washing. Please DO NOT use if you have an allergy to CHG or antibacterial soaps.  If your skin becomes reddened/irritated stop using the CHG and inform your nurse when you arrive at Short Stay. Do not shave (including legs and underarms) for  at least 48 hours prior to the first CHG shower.  You may shave your face/neck. Please follow these instructions carefully:  1.  Shower with CHG Soap the night before surgery and the  morning of Surgery.  2.  If you choose to wash your hair, wash your hair first as usual with your  normal  shampoo.  3.  After you shampoo, rinse your hair and body thoroughly to remove the  shampoo.                           4.  Use CHG as you would any other liquid soap.  You can apply chg directly  to the skin and wash  Gently with a scrungie or clean washcloth.  5.  Apply the CHG Soap to your body ONLY FROM THE NECK DOWN.   Do not use on face/ open                           Wound or open sores. Avoid contact with eyes, ears mouth and genitals (private parts).                       Wash face,  Genitals (private parts) with your normal soap.             6.  Wash thoroughly, paying special attention to the area where your surgery  will be performed.  7.  Thoroughly rinse your body with warm water from the neck down.  8.  DO NOT shower/wash with your normal soap after using and rinsing off  the CHG Soap.                9.  Pat yourself dry with a clean towel.            10.  Wear clean pajamas.            11.  Place clean sheets on your bed the night of your first shower and do not  sleep with pets. Day of Surgery : Do not apply any lotions/deodorants the morning of surgery.  Please wear clean clothes to the hospital/surgery center.  FAILURE TO FOLLOW THESE INSTRUCTIONS MAY RESULT IN THE CANCELLATION OF YOUR SURGERY PATIENT SIGNATURE_________________________________  NURSE SIGNATURE__________________________________  ________________________________________________________________________   Adam Phenix  An incentive spirometer is a tool that can help keep your lungs clear and active. This tool measures how well you are filling your lungs with each breath. Taking long deep  breaths may help reverse or decrease the chance of developing breathing (pulmonary) problems (especially infection) following:  A long period of time when you are unable to move or be active. BEFORE THE PROCEDURE   If the spirometer includes an indicator to show your best effort, your nurse or respiratory therapist will set it to a desired goal.  If possible, sit up straight or lean slightly forward. Try not to slouch.  Hold the incentive spirometer in an upright position. INSTRUCTIONS FOR USE  1. Sit on the edge of your bed if possible, or sit up as far as you can in bed or on a chair. 2. Hold the incentive spirometer in an upright position. 3. Breathe out normally. 4. Place the mouthpiece in your mouth and seal your lips tightly around it. 5. Breathe in slowly and as deeply as possible, raising the piston or the ball toward the top of the column. 6. Hold your breath for 3-5 seconds or for as long as possible. Allow the piston or ball to fall to the bottom of the column. 7. Remove the mouthpiece from your mouth and breathe out normally. 8. Rest for a few seconds and repeat Steps 1 through 7 at least 10 times every 1-2 hours when you are awake. Take your time and take a few normal breaths between deep breaths. 9. The spirometer may include an indicator to show your best effort. Use the indicator as a goal to work toward during each repetition. 10. After each set of 10 deep breaths, practice coughing to be sure your lungs are clear. If you have an incision (the cut made at the time of surgery),  support your incision when coughing by placing a pillow or rolled up towels firmly against it. Once you are able to get out of bed, walk around indoors and cough well. You may stop using the incentive spirometer when instructed by your caregiver.  RISKS AND COMPLICATIONS  Take your time so you do not get dizzy or light-headed.  If you are in pain, you may need to take or ask for pain medication before  doing incentive spirometry. It is harder to take a deep breath if you are having pain. AFTER USE  Rest and breathe slowly and easily.  It can be helpful to keep track of a log of your progress. Your caregiver can provide you with a simple table to help with this. If you are using the spirometer at home, follow these instructions: SEEK MEDICAL CARE IF:   You are having difficultly using the spirometer.  You have trouble using the spirometer as often as instructed.  Your pain medication is not giving enough relief while using the spirometer.  You develop fever of 100.5 F (38.1 C) or higher. SEEK IMMEDIATE MEDICAL CARE IF:   You cough up bloody sputum that had not been present before.  You develop fever of 102 F (38.9 C) or greater.  You develop worsening pain at or near the incision site. MAKE SURE YOU:   Understand these instructions.  Will watch your condition.  Will get help right away if you are not doing well or get worse. Document Released: 10/11/2006 Document Revised: 08/23/2011 Document Reviewed: 12/12/2006 Exit[employer] Patient Information 2014 Exit[employer], [place].   ________________________________________________________________________    CLEAR LIQUID DIET   Foods Allowed                                                                     Foods Excluded  Coffee and tea, regular and decaf                             liquids that you cannot  Plain Jell-O in any flavor                                             see through such as: Fruit ices (not with fruit pulp)                                     milk, soups, orange juice  Iced Popsicles                                    All solid food Carbonated beverages, regular and diet                                    Cranberry, grape and apple juices Sports drinks like Gatorade Lightly seasoned clear broth or consume(fat free) Sugar, honey syrup  Sample Menu Breakfast  Lunch                                      Supper Cranberry juice                    Beef broth                            Chicken broth Jell-O                                     Grape juice                           Apple juice Coffee or tea                        Jell-O                                      Popsicle                                                Coffee or tea                        Coffee or tea  _____________________________________________________________________

## 2014-08-24 ENCOUNTER — Ambulatory Visit (INDEPENDENT_AMBULATORY_CARE_PROVIDER_SITE_OTHER): Payer: Self-pay | Admitting: General Surgery

## 2014-08-24 NOTE — H&P (Signed)
Repeat labs drawn 08/23/14 Hgb/hct and Cr are at baseline       Rachel SellaEric M. Andrey CampanileWilson, MD, FACS General, Bariatric, & Minimally Invasive Surgery [facility], [place]

## 2014-08-27 ENCOUNTER — Inpatient Hospital Stay (HOSPITAL_COMMUNITY): Payer: Medicare Other | Admitting: Anesthesiology

## 2014-08-27 ENCOUNTER — Encounter (HOSPITAL_COMMUNITY)
Admission: RE | Disposition: A | Discharge status: Home / Self Care | Payer: Self-pay | Source: Ambulatory Visit | Attending: Surgery

## 2014-08-27 ENCOUNTER — Inpatient Hospital Stay (HOSPITAL_COMMUNITY)
Admission: RE | Admit: 2014-08-27 | Discharge: 2014-08-29 | DRG: 621 | Disposition: A | Discharge status: Home / Self Care | Payer: Medicare Other | Source: Ambulatory Visit | Attending: Surgery | Admitting: Surgery

## 2014-08-27 ENCOUNTER — Encounter (HOSPITAL_COMMUNITY): Payer: Self-pay | Admitting: *Deleted

## 2014-08-27 DIAGNOSIS — I2510 Atherosclerotic heart disease of native coronary artery without angina pectoris: Secondary | ICD-10-CM | POA: Diagnosis present

## 2014-08-27 DIAGNOSIS — E669 Obesity, unspecified: Secondary | ICD-10-CM | POA: Diagnosis present

## 2014-08-27 DIAGNOSIS — E039 Hypothyroidism, unspecified: Secondary | ICD-10-CM | POA: Diagnosis present

## 2014-08-27 DIAGNOSIS — R110 Nausea: Secondary | ICD-10-CM

## 2014-08-27 DIAGNOSIS — Z85528 Personal history of other malignant neoplasm of kidney: Secondary | ICD-10-CM

## 2014-08-27 DIAGNOSIS — E785 Hyperlipidemia, unspecified: Secondary | ICD-10-CM | POA: Diagnosis present

## 2014-08-27 DIAGNOSIS — Z79899 Other long term (current) drug therapy: Secondary | ICD-10-CM

## 2014-08-27 DIAGNOSIS — Z7982 Long term (current) use of aspirin: Secondary | ICD-10-CM

## 2014-08-27 DIAGNOSIS — I10 Essential (primary) hypertension: Secondary | ICD-10-CM | POA: Diagnosis present

## 2014-08-27 DIAGNOSIS — Z6838 Body mass index (BMI) 38.0-38.9, adult: Secondary | ICD-10-CM | POA: Diagnosis not present

## 2014-08-27 DIAGNOSIS — Z9884 Bariatric surgery status: Secondary | ICD-10-CM

## 2014-08-27 DIAGNOSIS — Z905 Acquired absence of kidney: Secondary | ICD-10-CM | POA: Diagnosis present

## 2014-08-27 HISTORY — PX: LAPAROSCOPIC GASTRIC SLEEVE RESECTION WITH HIATAL HERNIA REPAIR: SHX6512

## 2014-08-27 HISTORY — PX: UPPER GI ENDOSCOPY: SHX6162

## 2014-08-27 LAB — HEMOGLOBIN AND HEMATOCRIT, BLOOD
HEMATOCRIT: 38 % (ref 36.0–46.0)
HEMOGLOBIN: 12.4 g/dL (ref 12.0–15.0)

## 2014-08-27 SURGERY — GASTRECTOMY, SLEEVE, LAPAROSCOPIC, WITH HIATAL HERNIA REPAIR
Anesthesia: General | Site: Abdomen

## 2014-08-27 MED ORDER — MIDAZOLAM HCL 2 MG/2ML IJ SOLN
INTRAMUSCULAR | Status: AC
  Filled 2014-08-27: qty 2

## 2014-08-27 MED ORDER — CEFOTETAN DISODIUM-DEXTROSE 2-2.08 GM-% IV SOLR
INTRAVENOUS | Status: AC
  Filled 2014-08-27: qty 50

## 2014-08-27 MED ORDER — NEOSTIGMINE METHYLSULFATE 10 MG/10ML IV SOLN
INTRAVENOUS | Status: DC | PRN
  Administered 2014-08-27: 4 mg via INTRAVENOUS

## 2014-08-27 MED ORDER — DEXAMETHASONE SODIUM PHOSPHATE 10 MG/ML IJ SOLN
INTRAMUSCULAR | Status: AC
  Filled 2014-08-27: qty 1

## 2014-08-27 MED ORDER — ONDANSETRON HCL 4 MG/2ML IJ SOLN
INTRAMUSCULAR | Status: DC | PRN
  Administered 2014-08-27 (×2): 2 mg via INTRAVENOUS

## 2014-08-27 MED ORDER — PNEUMOCOCCAL VAC POLYVALENT 25 MCG/0.5ML IJ INJ
0.5000 mL | INJECTION | INTRAMUSCULAR | Status: DC
  Filled 2014-08-27 (×3): qty 0.5

## 2014-08-27 MED ORDER — TISSEEL VH 10 ML EX KIT
PACK | CUTANEOUS | Status: AC
  Filled 2014-08-27: qty 1

## 2014-08-27 MED ORDER — UNJURY VANILLA POWDER: 2.0000 [oz_av] | Freq: Four times a day (QID) | ORAL | Status: DC

## 2014-08-27 MED ORDER — BUPIVACAINE LIPOSOME 1.3 % IJ SUSP
20.0000 mL | Freq: Once | INTRAMUSCULAR | Status: AC
  Administered 2014-08-27: 20 mL
  Filled 2014-08-27: qty 20

## 2014-08-27 MED ORDER — FENTANYL CITRATE 0.05 MG/ML IJ SOLN
INTRAMUSCULAR | Status: DC | PRN
  Administered 2014-08-27 (×2): 50 ug via INTRAVENOUS
  Administered 2014-08-27 (×2): 25 ug via INTRAVENOUS
  Administered 2014-08-27 (×2): 50 ug via INTRAVENOUS

## 2014-08-27 MED ORDER — MIDAZOLAM HCL 5 MG/5ML IJ SOLN
INTRAMUSCULAR | Status: DC | PRN
  Administered 2014-08-27: 1 mg via INTRAVENOUS

## 2014-08-27 MED ORDER — ROCURONIUM BROMIDE 100 MG/10ML IV SOLN
INTRAVENOUS | Status: DC | PRN
  Administered 2014-08-27 (×2): 10 mg via INTRAVENOUS
  Administered 2014-08-27: 5 mg via INTRAVENOUS
  Administered 2014-08-27: 35 mg via INTRAVENOUS
  Administered 2014-08-27: 10 mg via INTRAVENOUS

## 2014-08-27 MED ORDER — LIDOCAINE HCL (CARDIAC) 20 MG/ML IV SOLN
INTRAVENOUS | Status: DC | PRN
  Administered 2014-08-27: 50 mg via INTRAVENOUS

## 2014-08-27 MED ORDER — CHLORHEXIDINE GLUCONATE CLOTH 2 % EX PADS: 6.0000 | MEDICATED_PAD | Freq: Once | CUTANEOUS | Status: DC

## 2014-08-27 MED ORDER — ACETAMINOPHEN 160 MG/5ML PO SOLN
650.0000 mg | ORAL | Status: DC | PRN
  Administered 2014-08-28 – 2014-08-29 (×2): 650 mg via ORAL
  Filled 2014-08-27 (×2): qty 20.3

## 2014-08-27 MED ORDER — MORPHINE SULFATE 2 MG/ML IJ SOLN
2.0000 mg | INTRAMUSCULAR | Status: DC | PRN
  Administered 2014-08-27 (×2): 4 mg via INTRAVENOUS
  Filled 2014-08-27 (×2): qty 2

## 2014-08-27 MED ORDER — OXYCODONE HCL 5 MG/5ML PO SOLN: 5.0000 mg | ORAL | Status: DC | PRN

## 2014-08-27 MED ORDER — DEXAMETHASONE SODIUM PHOSPHATE 10 MG/ML IJ SOLN
INTRAMUSCULAR | Status: DC | PRN
  Administered 2014-08-27: 10 mg via INTRAVENOUS

## 2014-08-27 MED ORDER — SODIUM CHLORIDE 0.9 % IJ SOLN
INTRAMUSCULAR | Status: AC
  Filled 2014-08-27: qty 50

## 2014-08-27 MED ORDER — ACETAMINOPHEN 160 MG/5ML PO SOLN: 325.0000 mg | ORAL | Status: DC | PRN

## 2014-08-27 MED ORDER — ACETAMINOPHEN 10 MG/ML IV SOLN
1000.0000 mg | Freq: Four times a day (QID) | INTRAVENOUS | Status: AC
  Administered 2014-08-27 – 2014-08-28 (×3): 1000 mg via INTRAVENOUS
  Filled 2014-08-27 (×5): qty 100

## 2014-08-27 MED ORDER — UNJURY CHICKEN SOUP POWDER: 2.0000 [oz_av] | Freq: Four times a day (QID) | ORAL | Status: DC

## 2014-08-27 MED ORDER — OXYCODONE HCL 5 MG/5ML PO SOLN: 5.0000 mg | ORAL | Status: AC | PRN

## 2014-08-27 MED ORDER — LACTATED RINGERS IV SOLN
INTRAVENOUS | Status: DC
  Administered 2014-08-27: 10:00:00 via INTRAVENOUS
  Administered 2014-08-27: 1000 mL via INTRAVENOUS

## 2014-08-27 MED ORDER — NEOSTIGMINE METHYLSULFATE 10 MG/10ML IV SOLN
INTRAVENOUS | Status: AC
Start: 2014-08-27 — End: 2014-08-27
  Filled 2014-08-27: qty 1

## 2014-08-27 MED ORDER — FENTANYL CITRATE 0.05 MG/ML IJ SOLN
INTRAMUSCULAR | Status: AC
  Filled 2014-08-27: qty 5

## 2014-08-27 MED ORDER — PROPOFOL 10 MG/ML IV BOLUS
INTRAVENOUS | Status: AC
  Filled 2014-08-27: qty 20

## 2014-08-27 MED ORDER — PROMETHAZINE HCL 25 MG/ML IJ SOLN
INTRAMUSCULAR | Status: AC
  Filled 2014-08-27: qty 1

## 2014-08-27 MED ORDER — HEPARIN SODIUM (PORCINE) 5000 UNIT/ML IJ SOLN
5000.0000 [IU] | INTRAMUSCULAR | Status: AC
  Administered 2014-08-27: 5000 [IU] via SUBCUTANEOUS
  Filled 2014-08-27: qty 1

## 2014-08-27 MED ORDER — GLYCOPYRROLATE 0.2 MG/ML IJ SOLN
INTRAMUSCULAR | Status: DC | PRN
  Administered 2014-08-27: 0.6 mg via INTRAVENOUS

## 2014-08-27 MED ORDER — ENOXAPARIN SODIUM 30 MG/0.3ML ~~LOC~~ SOLN
30.0000 mg | Freq: Two times a day (BID) | SUBCUTANEOUS | Status: DC
  Administered 2014-08-28 – 2014-08-29 (×3): 30 mg via SUBCUTANEOUS
  Filled 2014-08-27 (×5): qty 0.3

## 2014-08-27 MED ORDER — PROMETHAZINE HCL 25 MG/ML IJ SOLN
6.2500 mg | INTRAMUSCULAR | Status: DC | PRN
  Administered 2014-08-27: 6.25 mg via INTRAVENOUS

## 2014-08-27 MED ORDER — SODIUM CHLORIDE 0.9 % IJ SOLN
INTRAMUSCULAR | Status: DC | PRN
  Administered 2014-08-27: 50 mL

## 2014-08-27 MED ORDER — SUCCINYLCHOLINE CHLORIDE 20 MG/ML IJ SOLN
INTRAMUSCULAR | Status: DC | PRN
  Administered 2014-08-27: 100 mg via INTRAVENOUS

## 2014-08-27 MED ORDER — PHENYLEPHRINE HCL 10 MG/ML IJ SOLN
INTRAMUSCULAR | Status: AC
  Filled 2014-08-27: qty 1

## 2014-08-27 MED ORDER — STERILE WATER FOR IRRIGATION IR SOLN
Status: DC | PRN
  Administered 2014-08-27: 1500 mL

## 2014-08-27 MED ORDER — GLYCOPYRROLATE 0.2 MG/ML IJ SOLN
INTRAMUSCULAR | Status: AC
  Filled 2014-08-27: qty 3

## 2014-08-27 MED ORDER — ONDANSETRON HCL 4 MG/2ML IJ SOLN
INTRAMUSCULAR | Status: AC
  Filled 2014-08-27: qty 2

## 2014-08-27 MED ORDER — KCL IN DEXTROSE-NACL 20-5-0.45 MEQ/L-%-% IV SOLN
INTRAVENOUS | Status: DC
  Administered 2014-08-27 – 2014-08-29 (×5): via INTRAVENOUS
  Filled 2014-08-27 (×9): qty 1000

## 2014-08-27 MED ORDER — PANTOPRAZOLE SODIUM 40 MG IV SOLR
40.0000 mg | Freq: Every day | INTRAVENOUS | Status: DC
  Administered 2014-08-27 – 2014-08-28 (×2): 40 mg via INTRAVENOUS
  Filled 2014-08-27 (×3): qty 40

## 2014-08-27 MED ORDER — PROPOFOL 10 MG/ML IV BOLUS
INTRAVENOUS | Status: DC | PRN
  Administered 2014-08-27: 100 mg via INTRAVENOUS
  Administered 2014-08-27: 20 mg via INTRAVENOUS

## 2014-08-27 MED ORDER — ONDANSETRON HCL 4 MG/2ML IJ SOLN
4.0000 mg | INTRAMUSCULAR | Status: DC | PRN
  Administered 2014-08-27 – 2014-08-28 (×5): 4 mg via INTRAVENOUS
  Filled 2014-08-27 (×5): qty 2

## 2014-08-27 MED ORDER — LACTATED RINGERS IV SOLN
INTRAVENOUS | Status: DC
  Administered 2014-08-27: 1000 mL via INTRAVENOUS

## 2014-08-27 MED ORDER — OXYCODONE HCL 5 MG/5ML PO SOLN
5.0000 mg | Freq: Once | ORAL | Status: DC | PRN
  Filled 2014-08-27: qty 5

## 2014-08-27 MED ORDER — SODIUM CHLORIDE 0.9 % IJ SOLN
INTRAMUSCULAR | Status: AC
  Filled 2014-08-27: qty 3

## 2014-08-27 MED ORDER — HYDROMORPHONE HCL 1 MG/ML IJ SOLN: 0.2500 mg | INTRAMUSCULAR | Status: DC | PRN

## 2014-08-27 MED ORDER — PROMETHAZINE HCL 25 MG/ML IJ SOLN: 12.5000 mg | Freq: Four times a day (QID) | INTRAMUSCULAR | Status: DC | PRN

## 2014-08-27 MED ORDER — 0.9 % SODIUM CHLORIDE (POUR BTL) OPTIME
TOPICAL | Status: DC | PRN
  Administered 2014-08-27: 1000 mL

## 2014-08-27 MED ORDER — CEFOTETAN DISODIUM-DEXTROSE 2-2.08 GM-% IV SOLR
2.0000 g | INTRAVENOUS | Status: AC
  Administered 2014-08-27: 2 g via INTRAVENOUS

## 2014-08-27 MED ORDER — OXYCODONE HCL 5 MG PO TABS: 5.0000 mg | ORAL_TABLET | Freq: Once | ORAL | Status: DC | PRN

## 2014-08-27 MED ORDER — UNJURY CHOCOLATE CLASSIC POWDER
2.0000 [oz_av] | Freq: Four times a day (QID) | ORAL | Status: DC
  Administered 2014-08-29: 2 [oz_av] via ORAL

## 2014-08-27 MED ORDER — LACTATED RINGERS IR SOLN
Status: DC | PRN
  Administered 2014-08-27: 1000 mL

## 2014-08-27 SURGICAL SUPPLY — 79 items
APL SRG 32X5 SNPLK LF DISP (MISCELLANEOUS)
APPLICATOR COTTON TIP 6IN STRL (MISCELLANEOUS) IMPLANT
APPLIER CLIP ROT 10 11.4 M/L (STAPLE)
APPLIER CLIP ROT 13.4 12 LRG (CLIP) ×4
APR CLP LRG 13.4X12 ROT 20 MLT (CLIP) ×2
APR CLP MED LRG 11.4X10 (STAPLE)
BLADE SURG SZ11 CARB STEEL (BLADE) ×4 IMPLANT
CABLE HIGH FREQUENCY MONO STRZ (ELECTRODE) ×2 IMPLANT
CHLORAPREP W/TINT 26ML (MISCELLANEOUS) ×8 IMPLANT
CLIP APPLIE ROT 10 11.4 M/L (STAPLE) IMPLANT
CLIP APPLIE ROT 13.4 12 LRG (CLIP) IMPLANT
DEVICE SUT QUICK LOAD TK 5 (STAPLE) IMPLANT
DEVICE SUT TI-KNOT TK 5X26 (MISCELLANEOUS) IMPLANT
DEVICE SUTURE ENDOST 10MM (ENDOMECHANICALS) IMPLANT
DEVICE TI KNOT TK5 (MISCELLANEOUS)
DEVICE TROCAR PUNCTURE CLOSURE (ENDOMECHANICALS) ×4 IMPLANT
DRAPE CAMERA CLOSED 9X96 (DRAPES) ×2 IMPLANT
DRAPE UTILITY XL STRL (DRAPES) ×8 IMPLANT
ELECT REM PT RETURN 9FT ADLT (ELECTROSURGICAL) ×4
ELECTRODE REM PT RTRN 9FT ADLT (ELECTROSURGICAL) ×2 IMPLANT
GAUZE SPONGE 4X4 12PLY STRL (GAUZE/BANDAGES/DRESSINGS) IMPLANT
GAUZE SPONGE 4X4 16PLY XRAY LF (GAUZE/BANDAGES/DRESSINGS) ×2 IMPLANT
GLOVE BIO SURGEON STRL SZ 6.5 (GLOVE) ×1 IMPLANT
GLOVE BIO SURGEONS STRL SZ 6.5 (GLOVE) ×1
GLOVE BIOGEL M 8.0 STRL (GLOVE) ×2 IMPLANT
GLOVE BIOGEL M STRL SZ7.5 (GLOVE) ×4 IMPLANT
GLOVE BIOGEL PI IND STRL 6.5 (GLOVE) IMPLANT
GLOVE BIOGEL PI IND STRL 7.0 (GLOVE) IMPLANT
GLOVE BIOGEL PI INDICATOR 6.5 (GLOVE) ×2
GLOVE BIOGEL PI INDICATOR 7.0 (GLOVE) ×4
GLOVE SURG SS PI 6.5 STRL IVOR (GLOVE) ×6 IMPLANT
GOWN STRL REUS W/TWL XL LVL3 (GOWN DISPOSABLE) ×16 IMPLANT
HOVERMATT SINGLE USE (MISCELLANEOUS) ×4 IMPLANT
KIT BASIN OR (CUSTOM PROCEDURE TRAY) ×4 IMPLANT
LIQUID BAND (GAUZE/BANDAGES/DRESSINGS) ×4 IMPLANT
NDL SPNL 22GX3.5 QUINCKE BK (NEEDLE) ×2 IMPLANT
NEEDLE SPNL 22GX3.5 QUINCKE BK (NEEDLE) ×4 IMPLANT
PACK UNIVERSAL I (CUSTOM PROCEDURE TRAY) ×4 IMPLANT
PEN SKIN MARKING BROAD (MISCELLANEOUS) ×4 IMPLANT
QUICK LOAD TK 5 (STAPLE)
RELOAD STAPLE 60 3.6 BLU REG (STAPLE) IMPLANT
RELOAD STAPLE 60 3.8 GOLD REG (STAPLE) IMPLANT
RELOAD STAPLE 60 4.1 GRN THCK (STAPLE) IMPLANT
RELOAD STAPLE 60 BLK VRY/THCK (STAPLE) IMPLANT
RELOAD STAPLER 60MM BLK (STAPLE) ×4 IMPLANT
RELOAD STAPLER BLUE 60MM (STAPLE) IMPLANT
RELOAD STAPLER GOLD 60MM (STAPLE) ×2 IMPLANT
RELOAD STAPLER GREEN 60MM (STAPLE) ×8 IMPLANT
SCISSORS LAP 5X35 DISP (ENDOMECHANICALS) IMPLANT
SCISSORS LAP 5X45 EPIX DISP (ENDOMECHANICALS) ×4 IMPLANT
SEALANT SURGICAL APPL DUAL CAN (MISCELLANEOUS) ×2 IMPLANT
SET IRRIG TUBING LAPAROSCOPIC (IRRIGATION / IRRIGATOR) ×4 IMPLANT
SHEARS CURVED HARMONIC AC 45CM (MISCELLANEOUS) ×4 IMPLANT
SLEEVE ADV FIXATION 5X100MM (TROCAR) ×8 IMPLANT
SLEEVE GASTRECTOMY 36FR VISIGI (MISCELLANEOUS) ×4 IMPLANT
SLEEVE XCEL OPT CAN 5 100 (ENDOMECHANICALS) IMPLANT
SOLUTION ANTI FOG 6CC (MISCELLANEOUS) ×4 IMPLANT
SPONGE LAP 18X18 X RAY DECT (DISPOSABLE) ×4 IMPLANT
STAPLER ECHELON BIOABSB 60 FLE (MISCELLANEOUS) ×12 IMPLANT
STAPLER ECHELON LONG 60 440 (INSTRUMENTS) ×2 IMPLANT
STAPLER RELOAD 60MM BLK (STAPLE) ×8
STAPLER RELOAD BLUE 60MM (STAPLE)
STAPLER RELOAD GOLD 60MM (STAPLE) ×4
STAPLER RELOAD GREEN 60MM (STAPLE) ×16
SUT MNCRL AB 4-0 PS2 18 (SUTURE) ×4 IMPLANT
SUT SURGIDAC NAB ES-9 0 48 120 (SUTURE) IMPLANT
SUT VICRYL 0 TIES 12 18 (SUTURE) ×4 IMPLANT
SYR 20CC LL (SYRINGE) ×4 IMPLANT
SYR 50ML LL SCALE MARK (SYRINGE) ×4 IMPLANT
TOWEL OR 17X26 10 PK STRL BLUE (TOWEL DISPOSABLE) ×4 IMPLANT
TOWEL OR NON WOVEN STRL DISP B (DISPOSABLE) ×4 IMPLANT
TRAY FOLEY CATH 14FRSI W/METER (CATHETERS) ×2 IMPLANT
TROCAR ADV FIXATION 5X100MM (TROCAR) ×4 IMPLANT
TROCAR BLADELESS 15MM (ENDOMECHANICALS) ×2 IMPLANT
TROCAR BLADELESS OPT 5 100 (ENDOMECHANICALS) ×4 IMPLANT
TUBING CONNECTING 10 (TUBING) ×3 IMPLANT
TUBING CONNECTING 10' (TUBING) ×1
TUBING ENDO SMARTCAP (MISCELLANEOUS) ×4 IMPLANT
TUBING FILTER THERMOFLATOR (ELECTROSURGICAL) ×2 IMPLANT

## 2014-08-27 NOTE — Anesthesia Procedure Notes (Signed)
Procedure Name: Intubation Performed by: Renee Erb D Pre-anesthesia Checklist: Patient identified, Emergency Drugs available, Suction available and Patient being monitored Patient Re-evaluated:Patient Re-evaluated prior to inductionOxygen Delivery Method: Circle System Utilized Preoxygenation: Pre-oxygenation with 100% oxygen Intubation Type: IV induction Ventilation: Mask ventilation without difficulty Laryngoscope Size: Mac and 4 Grade View: Grade II Tube type: Oral Tube size: 7.5 mm Number of attempts: 1 Airway Equipment and Method: Stylet and Oral airway Placement Confirmation: ETT inserted through vocal cords under direct vision,  positive ETCO2 and breath sounds checked- equal and bilateral Secured at: 22 cm Tube secured with: Tape Dental Injury: Teeth and Oropharynx as per pre-operative assessment      

## 2014-08-27 NOTE — H&P (Signed)
[name] 07/18/2014 4:18 PM Location: [facility] Patient #: [id number] DOB: 09/23/1945 Married / Language: English / Race: White Female  History of Present Illness Rachel Hiss M. Rayette Mogg MD; 07/18/2014 5:33 PM) Patient words: f/u.  The patient is a 69 year old female presenting status-post bariatric surgery. She underwent laparoscopic removal of her lap band with lysis of adhesions and gastric wedge resection on December 22. I last saw her on 1/6. She was discharged home on December 24. Her operative course was complicated by persistent hypotension during surgery requiring dopamine infusion. She ended up going to the stepdown unit postoperatively and was ultimately able to be weaned from the dopamine infusion. Critical care medicine was involved. A cardiac event was ruled out. It was unclear what caused her intraoperative and immediate postoperative hypotension. The best guess was that it was due to her taking her mood medications that morning. In taking down the imbrication of her lap band a gastrotomy was made. This required a small wedge resection of the fundus of the stomach along the greater curvature. She denies any fever, chills, nausea, vomiting, diarrhea or constipation. She is just frustrated about having to wait additional time to have revisional surgery ?her sleeve gastrectomy. She is concerned about the amount of weight she is gaining. She is now having some lower back pain. She underwent an upper GI the other day which demonstrated no evidence of leak. There was no delay of contrast in her esophagus passed into her stomach. There was a question of a small hiatal hernia. She comes in today to discuss surgical planning. She did have repeat blood work at her primary care physician's office in early January which showed a normal CBC, B met, as well as normal TSH. She has also seen her urologist for long-term follow-up and she states no acute issues were  identified   Other Problems Rachel Curry, MD; 07/18/2014 5:32 PM) High blood pressure Thyroid Disease OBESITY (BMI 30-39.9) (278.00  E66.9) CORONARY ARTERY DISEASE WITHOUT ANGINA PECTORIS, UNSPECIFIED VESSEL OR LESION TYPE, UNSPECIFIED WHETHER NATIVE OR TRANSPLANTED HEART (414.00  I25.10) LAP-BAND SURGERY STATUS (V45.86  Z98.84) HTN (HYPERTENSION), BENIGN (401.1  I10) DYSLIPIDEMIA (272.4  E78.5) HYPOTHYROIDISM, ADULT (244.9  E03.9)  Past Surgical History Rachel Curry, MD; 07/18/2014 5:32 PM) Hysterectomy (not due to cancer) - Complete Knee Surgery Bilateral. Lap Band Nephrectomy Left. left Cholecystectomy Appendectomy Hysterectomy Ventral Hernia [phone] with Goretex mesh 15x19cm upper abd Lap Removal of LapBand, with LOA, lap gastric wedge resection 06/04/14  Allergies ([name], CMA; 07/18/2014 4:19 PM) No Known Drug Allergies12/08/2013  Medication History Rachel Curry, MD; 07/18/2014 5:32 PM) OxyCODONE HCl (5MG [name] Solution, 5-10 Oral every four hours, as needed, Taken starting 05/16/2014) Active. Norvasc (5MG  Tablet, Oral) Active. Baby Aspirin (81MG  Tablet Chewable, Oral) Active. BuPROPion HCl (75MG  Tablet, Oral) Active. Calcium Carbonate (600MG  Tablet, Oral) Active. Cholecalciferol (10000UNIT Capsule, Oral) Active. Caltrate 600+D Plus (600-400MG -UNIT Tablet Chewable, Oral) Active. ClonazePAM ODT (2MG  Tablet Disperse, Oral) Active. Dextromethorphan-GG (30-600MG  Tablet ER, Oral) Active. Synthroid (75MCG Tablet, Oral) Active. Lyrica (75MG  Capsule, Oral) Active. Crestor (20MG  Tablet, Oral) Active. Vitamin B 12 (100MCG Lozenge, Oral) Active. Xenical (120MG  Capsule, 1 (one) Capsule Oral three times daily, Taken starting 07/18/2014) Active. (with meals)  Social History [name], CMA; 07/18/2014 4:18 PM) Caffeine use Coffee. No alcohol use No drug use Tobacco use Never smoker.  Family History [name], CMA; 07/18/2014 4:18  PM) Family history unknown First Degree Relatives  Pregnancy / Birth History [name] [place],  CMA; 07/18/2014 4:18 PM) Age at menarche 28 years. Irregular periods  Review of Systems ([name]; 07/18/2014 4:18 PM) General Present- Fatigue, Night Sweats and Weight Gain. Not Present- Appetite Loss, Chills, Fever and Weight Loss. Skin Not Present- Change in Wart/Mole, Dryness, Hives, Jaundice, New Lesions, Non-Healing Wounds, Rash and Ulcer. HEENT Not Present- Earache, Hearing Loss, Hoarseness, Nose Bleed, Oral Ulcers, Ringing in the Ears, Seasonal Allergies, Sinus Pain, Sore Throat, Visual Disturbances, Wears glasses/contact lenses and Yellow Eyes. Respiratory Not Present- Bloody sputum, Chronic Cough, Difficulty Breathing, Snoring and Wheezing. Breast Not Present- Breast Mass, Breast Pain, Nipple Discharge and Skin Changes. Cardiovascular Not Present- Chest Pain, Difficulty Breathing Lying Down, Leg Cramps, Palpitations, Rapid Heart Rate, Shortness of Breath and Swelling of Extremities. Gastrointestinal Not Present- Abdominal Pain, Bloating, Bloody Stool, Change in Bowel Habits, Chronic diarrhea, Constipation, Difficulty Swallowing, Excessive gas, Gets full quickly at meals, Hemorrhoids, Indigestion, Nausea, Rectal Pain and Vomiting. Female Genitourinary Not Present- Frequency, Nocturia, Painful Urination, Pelvic Pain and Urgency. Musculoskeletal Not Present- Back Pain, Joint Pain, Joint Stiffness, Muscle Pain, Muscle Weakness and Swelling of Extremities. Neurological Not Present- Decreased Memory, Fainting, Headaches, Numbness, Seizures, Tingling, Tremor, Trouble walking and Weakness. Psychiatric Not Present- Anxiety, Bipolar, Change in Sleep Pattern, Depression, Fearful and Frequent crying. Endocrine Not Present- Cold Intolerance, Excessive Hunger, Hair Changes, Heat Intolerance, Hot flashes and New Diabetes.   Vitals ([name] CMA; 07/18/2014 4:19 PM) 07/18/2014 4:18 PM Weight: 240 lb  Height: 67in Body Surface Area: 2.27 m Body Mass Index: 37.59 kg/m Temp.: 97.37F(Temporal)  Pulse: 78 (Regular)  BP: 134/80 (Sitting, Left Arm, Standard)    Physical Exam Rachel Hiss M. Bobbette Eakes MD; 07/18/2014 5:25 PM) General Mental Status-Alert. General Appearance-Consistent with stated age. Hydration-Well hydrated. Voice-Normal.  Chest and Lung Exam Chest and lung exam reveals -quiet, even and easy respiratory effort with no use of accessory muscles. Inspection Chest Wall - Normal. Back - normal.  Cardiovascular Cardiovascular examination reveals -normal heart sounds, regular rate and rhythm with no murmurs.    Assessment & Plan Rachel Hiss M. Ikeya Brockel MD; 07/18/2014 5:32 PM) HISTORY OF REMOVAL OF LAPAROSCOPIC GASTRIC BANDING DEVICE (V45.86  Z98.84) Story: and gastric wedge resection Impression: We rediscussed intraoperative findings as well as what happened postoperatively. Best guess was that her Wellbutrin and Celexa blunted her response attributing to her hypotension. Because of the intraoperative hypotension requiring vasopressor, gastrotomy, and elevated creatinine from her baseline I did not feel it was safe to proceed with same day sleeve gastrectomy. Her upper GI the other day was reassuring. Contrast flowed easily into her small bowel. There appeared to be no significant defect to her stomach anatomy from her gastric wedge resection. There was a reading of a possible hiatal hernia however this could just be the proximal stomach that was located above the band. I explained that we would resubmit her paperwork and request authorization for sleeve gastrectomy with possible hiatal hernia repair. We rediscussed the risks and benefits of surgery which we had gone however numerous times over the past year. We specifically discussed the increased risk of leak since this is revisional surgery, acute kidney injury because of her having only 1 functional kidney, potential  readmission to the hospital, blood clot permission, injury to surrounding structures, postoperative GI issues such as reflux/epigastric cramping as well as other issues previously discussed. I still would like to hold off operating on her until sometime in March.  High blood pressure  OBESITY (BMI 30-39.9) (278.00  E66.9) Impression: She is concerned about weight  regain. She has gained about 3 pounds since her last visit. Her lowest weight after lap band surgery was around 203 pounds. Her most recent weight since last summer has been averaging around 230 pounds. She is very interested in some type of prescription medication to help with weight loss in the interim until surgery. Because of her multiple medical comorbidities I believe the safest option would be Xenical. We discussed side effects associated with Xenical. Current Plans  Started Xenical $RemoveBeforeD'120MG'[id number]$ , 1 (one) Capsule three times daily, #90, 30 days starting 07/18/2014, No Refill. Local Order: with meals  HYPOTHYROIDISM, ADULT (244.9  E03.9) Impression: Managed per PCP  DYSLIPIDEMIA (272.4  E78.5)

## 2014-08-27 NOTE — Progress Notes (Signed)
Utilization review completed.  

## 2014-08-27 NOTE — Anesthesia Preprocedure Evaluation (Addendum)
Anesthesia Evaluation  Patient identified by MRN, date of birth, ID band Patient awake  General Assessment Comment:Prolonged period of hypotension last procedure requiring vasopressors  Reviewed: Allergy & Precautions, NPO status , Patient's Chart, lab work & pertinent test results  History of Anesthesia Complications (+) history of anesthetic complications  Airway Mallampati: II   Neck ROM: Full    Dental  (+) Missing, Poor Dentition   Pulmonary shortness of breath,  breath sounds clear to auscultation        Cardiovascular hypertension, + CAD Rhythm:Regular Rate:Normal     Neuro/Psych    GI/Hepatic   Endo/Other  Hypothyroidism Morbid obesity  Renal/GU Renal disease     Musculoskeletal  (+) Arthritis -, Fibromyalgia -  Abdominal (+) + obese,   Peds  Hematology   Anesthesia Other Findings   Reproductive/Obstetrics                            Anesthesia Physical Anesthesia Plan  ASA: III  Anesthesia Plan: General   Post-op Pain Management:    Induction: Intravenous  Airway Management Planned: Oral ETT  Additional Equipment:   Intra-op Plan:   Post-operative Plan: Extubation in OR  Informed Consent: I have reviewed the patients History and Physical, chart, labs and discussed the procedure including the risks, benefits and alternatives for the proposed anesthesia with the patient or authorized representative who has indicated his/her understanding and acceptance.   Dental advisory given  Plan Discussed with: CRNA and Surgeon  Anesthesia Plan Comments:         Anesthesia Quick Evaluation

## 2014-08-27 NOTE — Interval H&P Note (Signed)
History and Physical Interval Note:  08/27/2014 8:51 AM  Rachel GougeJanet L Agramonte  has presented today for surgery, with the diagnosis of Morbid Obesity  The various methods of treatment have been discussed with the patient and family. After consideration of risks, benefits and other options for treatment, the patient has consented to  Procedure(s): LAPAROSCOPIC SLEEVE GASTRECTOMY WITH HIATAL HERNIA REPAIR (N/A) UPPER GI ENDOSCOPY (N/A) as a surgical intervention .  The patient's history has been reviewed, patient examined, no change in status, stable for surgery.  I have reviewed the patient's chart and labs.  Questions were answered to the patient's satisfaction.    Repeat preop labs are at baseline for pt  Mary SellaEric M. Andrey CampanileWilson, MD, FACS General, Bariatric, & Minimally Invasive Surgery [facility], [place]    [facility] CenterWILSON,Edye Hainline M

## 2014-08-27 NOTE — Anesthesia Postprocedure Evaluation (Signed)
  Anesthesia Post-op Note  Patient: [name]  Procedure(s) Performed: Procedure(s): LAPAROSCOPIC SLEEVE GASTRECTOMY WITH UPPER ENDOSCOPY (N/A) UPPER GI ENDOSCOPY (N/A)  Patient Location: PACU  Anesthesia Type:General  Level of Consciousness: awake and alert   Airway and Oxygen Therapy: Patient Spontanous Breathing  Post-op Pain: mild  Post-op Assessment: Post-op Vital signs reviewed and Patient's Cardiovascular Status Stable  Post-op Vital Signs: stable  Last Vitals:  Filed Vitals:   08/27/14 1245  BP: 132/64  Pulse: 69  Temp:   Resp: 27    Complications: No apparent anesthesia complications

## 2014-08-27 NOTE — Transfer of Care (Signed)
Immediate Anesthesia Transfer of Care Note  Patient: [name]  Procedure(s) Performed: Procedure(s): LAPAROSCOPIC SLEEVE GASTRECTOMY WITH UPPER ENDOSCOPY (N/A) UPPER GI ENDOSCOPY (N/A)  Patient Location: PACU  Anesthesia Type:General  Level of Consciousness: awake, alert  and oriented  Airway & Oxygen Therapy: Patient Spontanous Breathing and Patient connected to face mask oxygen  Post-op Assessment: Report given to RN and Post -op Vital signs reviewed and stable  Post vital signs: Reviewed and stable  Last Vitals:  Filed Vitals:   08/27/14 0737  BP: 139/80  Pulse: 81  Temp: 36.7 C  Resp: 18    Complications: No apparent anesthesia complications

## 2014-08-27 NOTE — Op Note (Signed)
08/27/2014 [name] 01/18/1946 [id number]   PRE-OPERATIVE DIAGNOSIS:   1. Obesity, BMI 38. 2. History of laparoscopic adjustable gastric band placement in 2013. 3. History of laparoscopic removal of adjustable gastric band, gastric wedge resection 05/2014 4. Coronary artery disease. 5. Hypertension. 6. Dyslipidemia. 7. History of left nephrectomy due to renal cell carcinoma.   POST-OPERATIVE DIAGNOSIS:  same  PROCEDURE:  Procedure(s): LAPAROSCOPIC SLEEVE GASTRECTOMY UPPER GI ENDOSCOPY  SURGEON:  Surgeon(s): Atilano InaEric M Kolbie Clarkston, MD FACS FASMBS  ASSISTANTS: Luretha MurphyMatthew Martin MD FACS  ANESTHESIA:   general  DRAINS: none   BOUGIE: 36 fr ViSiGi  LOCAL MEDICATIONS USED:  MARCAINE + 70 cc Exparel  SPECIMEN:  Source of Specimen:  Greater curvature of stomach  DISPOSITION OF SPECIMEN:  PATHOLOGY  COUNTS:  YES  INDICATION FOR PROCEDURE: This is a very pleasant 69 year old obese WF who has had unsuccessful attempts for sustained weight loss. She presents today for a planned laparoscopic sleeve gastrectomy with upper endoscopy. She had undergone prior laparoscopic adjustable gastric banding but developed problems with her lap band despite manipulation of the fluid as well as reeducation regarding eating techniques.  She was taken to the operating room this past December for removal of her laparoscopic adjustable gastric band as well as conversion to sleeve gastrectomy however during the case she was hypotensive and required vasopressor to maintain a normal blood pressure. A gastrotomy was also made requiring a small gastric wedge resection of the fundus. Therefore I did not elect to proceed with sleeve gastrectomy that day. She recovered from that surgery without event. She underwent a postoperative upper GI in February which demonstrated no extravasation of contrast, prompt emptying of contrast from stomach and a possible small hiatal hernia. In reviewing the upper GI myself I believe what  was interpreted as a small hiatal hernia was in fact the stomach that had been above the lap band. We have discussed the risk and benefits of the procedure extensively preoperatively. Please see my separate notes.  PROCEDURE: After obtaining informed consent and receiving 5000 units of subcutaneous heparin, the [facility] and placed supine on the operating room table. General endotracheal anesthesia was established. Sequential compression devices were placed. A Foley catheter was placed. A orogastric tube was placed. The patient's abdomen was prepped and draped in the usual standard surgical fashion. She received preoperative IV antibiotics. A surgical timeout was performed.  Access to the abdomen was achieved using a 5 mm 0 laparoscope thru a 5 mm trocar In the left upper Quadrant 2 fingerbreadths below the left subcostal margin using the Optiview technique. Pneumoperitoneum was smoothly established up to 15 mm of mercury. The laparoscope was advanced and the abdominal cavity was surveilled.  A 5 mm trocar was placed slightly above and to the left of the umbilicus under direct visualization. The patient was then placed in reverse Trendelenburg. The [facility] liver retractor was placed under the left lobe of the liver through a 5 mm trocar incision site in the subxiphoid position. A 5 mm trocar was placed in the lateral right upper quadrant along with a 15 mm trocar in the mid right abdomen  All under direct visualization after local had been infiltrated.  The stomach was inspected. It was completely decompressed and the orogastric tube was removed. There is no overt sign of a hiatal hernia. There was adhesions at the GE junction and at the cardia. Some of the undersurface of the left lobe of the  liver was adhered to the proximal cardia   The calibration tube was placed in the oropharynx and guided down into the stomach by the CRNA. 10 mL of air was  insufflated into the calibration balloon. The calibration tubing was then gently pulled back by the CRNA and it did not appear to pass the GE junction. At this point the calibration tubing was desufflated and pulled back into the esophagus.   We identified the pylorus and measured 5- 6 cm proximal to the pylorus and identified an area of where we would start taking down the short gastric vessels. Harmonic scalpel was used to take down the short gastric vessels along the greater curvature of the stomach. We were able to enter the lesser sac. We continued to march along the greater curvature of the stomach taking down the short gastrics. As we approached the gastrosplenic ligament we took care in this area not to injure the spleen. We were able to take down the entire gastrosplenic ligament. The superior pole the spleen was somewhat curve down and attached to the fundus of the stomach. I went back and started taking down the adhesions from the cardia to the left lobe of the liver. This was done sharply with EndoShears without electrocautery. I ended up getting into some bleeding from the diaphragm muscle on the left side. A surgical Ray-Tec gauze was placed in the abdomen around was area. Also used a Harmonic scalpel to achieve hemostasis. I went back to the gastrosplenic ligament. There is not a great plane between the spleen and the stomach probably due to the prior lap band surgery. However I was able to mobilize the stomach away from the spleen using harmonic scalpel. We then mobilized the fundus away from the left crus of diaphragm. There were not any significant posterior gastric avascular attachments. This left the stomach completely mobilized. No vessels had been taken down along the lesser curvature of the stomach. The prior staple line in the gastric fundus was identified.  We then reidentified the pylorus. A 36Fr ViSiGi was then placed in the oropharynx and advanced down into the stomach and placed in  the distal antrum and positioned along the lesser curvature. It was placed under suction which secured the 36Fr ViSiGi in place along the lesser curve. Then using the Ethicon echelon 60 mm stapler with a green load with Seamguard, I placed a stapler along the antrum approximately 5 cm from the pylorus. The stapler was angled so that there is ample room at the angularis incisura. I then fired the first staple load after inspecting it posteriorly to ensure adequate space both anteriorly and posteriorly. At this point I still was not completely past the angularis so with another green load with Seamguard, I placed the stapler in position just inside the prior stapleline. We then rotated the stomach to insure that there was adequate anteriorly as well as posteriorly. The stapler was then fired. I used another 60mm green cartridge with seamguard. At this point I continued using 60 mm green load staple cartridges with Seamguard. The echelon stapler was then repositioned with a 60 mm green load with Seamguard and we continued to march up along the ViSiGi. My assistant was holding traction along the greater curvature stomach along the cauterized short gastric vessels ensuring that the stomach was symmetrically retracted. Prior to each firing of the staple, we rotated the stomach to ensure that there is adequate stomach left. In the midportion of the stomach I did use a 60  mm gold load stapler with a SEAMGUARD. As we approached the fundus, I used two 60 mm black cartridge with Seamguard aiming lateral to the esophageal fat pad. I was able to staple just inside the old gastric fundus staple line. The sleeve was inspected. There is no evidence of cork screw. The staple line appeared hemostatic. There was a small amount of fundus left behind however given the prior proximal gastric surgery and her advanced age I felt it was safer to leave this alone. The CRNA inflated the ViSiGi to the green zone and the upper abdomen was  flooded with saline. There were no bubbles. The sleeve was decompressed and the ViSiGi removed. My assistant scrubbed out and performed an upper endoscopy. The sleeve easily distended with air and the scope was easily advanced to the pylorus. There is no evidence of internal bleeding or cork screwing. There was no narrowing at the angularis. There is no evidence of bubbles. Please see his operative note for further details. The gastric sleeve was decompressed and the endoscope was removed.  I placed Tisseel tissue sealant along the sleeve gastrectomy staple line. The greater curvature the stomach was grasped with a laparoscopic grasper and removed from the 15 mm trocar site.  The liver retractor was removed. I then closed the 15 mm trocar site with 2 interrupted 0 Vicryl sutures through the fascia using the endoclose. The closure was viewed laparoscopically and it was airtight. 70 cc of Exparel was then infiltrated in the preperitoneal spaces around the trocar sites. Pneumoperitoneum was released. All trocar sites were closed with a 4-0 Monocryl in a subcuticular fashion followed by the application of Dermabond. The patient was extubated and taken to the recovery room in stable condition. All needle, instrument, and sponge counts were correct x2. There are no immediate complications  (4) 60 mm green with Seamguard (1) 60 mm gold with seamguard (2) 60 mm black with seamguard  PLAN OF CARE: Admit to inpatient   PATIENT DISPOSITION:  PACU - hemodynamically stable.   Delay start of Pharmacological VTE agent (>24hrs) due to surgical blood loss or risk of bleeding:  no  [name]. Andrey Campanile, MD, FACS General, Bariatric, & Minimally Invasive Surgery [facility], [place]

## 2014-08-28 ENCOUNTER — Encounter (HOSPITAL_COMMUNITY): Payer: Self-pay | Admitting: General Surgery

## 2014-08-28 ENCOUNTER — Inpatient Hospital Stay (HOSPITAL_COMMUNITY): Payer: Medicare Other

## 2014-08-28 LAB — COMPREHENSIVE METABOLIC PANEL
ALK PHOS: 82 U/L (ref 39–117)
ALT: 39 U/L — AB (ref 0–35)
AST: 40 U/L — AB (ref 0–37)
Albumin: 4.1 g/dL (ref 3.5–5.2)
Anion gap: 12 (ref 5–15)
BUN: 14 mg/dL (ref 6–23)
CALCIUM: 9.3 mg/dL (ref 8.4–10.5)
CO2: 24 mmol/L (ref 19–32)
Chloride: 103 mmol/L (ref 96–112)
Creatinine, Ser: 1.04 mg/dL (ref 0.50–1.10)
GFR calc non Af Amer: 54 mL/min — ABNORMAL LOW (ref >90)
GFR, EST AFRICAN AMERICAN: 63 mL/min — AB (ref >90)
GLUCOSE: 153 mg/dL — AB (ref 70–99)
POTASSIUM: 4.2 mmol/L (ref 3.5–5.1)
SODIUM: 139 mmol/L (ref 135–145)
Total Bilirubin: 0.6 mg/dL (ref 0.3–1.2)
Total Protein: 7.1 g/dL (ref 6.0–8.3)

## 2014-08-28 LAB — CBC WITH DIFFERENTIAL/PLATELET
BASOS PCT: 0 % (ref 0–1)
Basophils Absolute: 0 10*3/uL (ref 0.0–0.1)
Eosinophils Absolute: 0 10*3/uL (ref 0.0–0.7)
Eosinophils Relative: 0 % (ref 0–5)
HEMATOCRIT: 39.3 % (ref 36.0–46.0)
Hemoglobin: 12.9 g/dL (ref 12.0–15.0)
Lymphocytes Relative: 10 % — ABNORMAL LOW (ref 12–46)
Lymphs Abs: 0.9 10*3/uL (ref 0.7–4.0)
MCH: 29.3 pg (ref 26.0–34.0)
MCHC: 32.8 g/dL (ref 30.0–36.0)
MCV: 89.3 fL (ref 78.0–100.0)
Monocytes Absolute: 0.5 10*3/uL (ref 0.1–1.0)
Monocytes Relative: 6 % (ref 3–12)
NEUTROS ABS: 7.7 10*3/uL (ref 1.7–7.7)
NEUTROS PCT: 84 % — AB (ref 43–77)
Platelets: 244 10*3/uL (ref 150–400)
RBC: 4.4 MIL/uL (ref 3.87–5.11)
RDW: 13.1 % (ref 11.5–15.5)
WBC: 9.2 10*3/uL (ref 4.0–10.5)

## 2014-08-28 LAB — HEMOGLOBIN AND HEMATOCRIT, BLOOD
HCT: 39.4 % (ref 36.0–46.0)
Hemoglobin: 12.9 g/dL (ref 12.0–15.0)

## 2014-08-28 MED ORDER — IOHEXOL 300 MG/ML  SOLN
50.0000 mL | Freq: Once | INTRAMUSCULAR | Status: AC | PRN
  Administered 2014-08-28: 50 mL via ORAL

## 2014-08-28 NOTE — Progress Notes (Signed)
Patient alert and oriented, Post op day 1.  Provided support and encouragement.  Encouraged pulmonary toilet, ambulation and small sips of liquids when swallow study returned satisfactory.  All questions answered.  Will continue to monitor. 

## 2014-08-28 NOTE — Plan of Care (Signed)
Problem: Food- and Nutrition-Related Knowledge Deficit (NB-1.1) Goal: Nutrition education Formal process to instruct or train a patient/client in a skill or to impart knowledge to help patients/clients voluntarily manage or modify food choices and eating behavior to maintain or improve health. Outcome: Completed/Met Date Met:  08/28/14 Nutrition Education Note  Received consult for diet education per DROP protocol.   Discussed 2 week post op diet with pt. Emphasized that liquids must be non carbonated, non caffeinated, and sugar free. Fluid goals discussed. Pt to follow up with outpatient bariatric RD for further diet progression after 2 weeks. Multivitamins and minerals also reviewed. Teach back method used, pt expressed understanding, expect good compliance.   Diet: First 2 Weeks  You will see the nutritionist about two (2) weeks after your surgery. The nutritionist will increase the types of foods you can eat if you are handling liquids well:  If you have severe vomiting or nausea and cannot handle clear liquids lasting longer than 1 day, call your surgeon  Protein Shake  Drink at least 2 ounces of shake 5-6 times per day  Each serving of protein shakes (usually 8 - 12 ounces) should have a minimum of:  15 grams of protein  And no more than 5 grams of carbohydrate  Goal for protein each day:  Men = 80 grams per day  Women = 60 grams per day  Protein powder may be added to fluids such as non-fat milk or Lactaid milk or Soy milk (limit to 35 grams added protein powder per serving)   Hydration  Slowly increase the amount of water and other clear liquids as tolerated (See Acceptable Fluids)  Slowly increase the amount of protein shake as tolerated  Sip fluids slowly and throughout the day  May use sugar substitutes in small amounts (no more than 6 - 8 packets per day; i.e. Splenda)   Fluid Goal  The first goal is to drink at least 8 ounces of protein shake/drink per day (or as directed  by the nutritionist); some examples of protein shakes are [employer], [employer], EAS Edge HP, and Unjury. See handout from pre-op Bariatric Education Class:  Slowly increase the amount of protein shake you drink as tolerated  You may find it easier to slowly sip shakes throughout the day  It is important to get your proteins in first  Your fluid goal is to drink 64 - 100 ounces of fluid daily  It may take a few weeks to build up to this  32 oz (or more) should be clear liquids  And  32 oz (or more) should be full liquids (see below for examples)  Liquids should not contain sugar, caffeine, or carbonation   Clear Liquids:  Water or Sugar-free flavored water (i.e. Fruit H2O, Propel)  Decaffeinated coffee or tea (sugar-free)  Crystal Lite, Wyler's Lite, Minute Maid Lite  Sugar-free Jell-O  Bouillon or broth  Sugar-free Popsicle: *Less than 20 calories each; Limit 1 per day   Full Liquids:  Protein Shakes/Drinks + 2 choices per day of other full liquids  Full liquids must be:  No More Than 12 grams of Carbs per serving  No More Than 3 grams of Fat per serving  Strained low-fat cream soup  Non-Fat milk  Fat-free Lactaid Milk  Sugar-free yogurt (Dannon Lite & Fit, Greek yogurt)     Clayton Bibles, MS, RD, LDN Pager: [phone] After Hours Pager: [phone]

## 2014-08-28 NOTE — Progress Notes (Signed)
Patient ID: [name], female   DOB: 1946-01-22, 69 y.o.   MRN: [id number] [facility] Progress Note:   1 Day Post-Op  Subjective: Mental status is alert and clear.  No complaints of pain and has been walking ad lib.   Objective: Vital signs in last 24 hours: Temp:  [97.5 F (36.4 C)-98.7 F (37.1 C)] 98.7 F (37.1 C) (03/16 0530) Pulse Rate:  [69-88] 85 (03/16 0530) Resp:  [12-27] 18 (03/16 0530) BP: (114-157)/(55-80) 130/79 mmHg (03/16 0530) SpO2:  [92 %-98 %] 93 % (03/16 0530) Weight:  [239 lb 3.2 oz (108.5 kg)] 239 lb 3.2 oz (108.5 kg) (03/16 0659)  Intake/Output from previous day: 03/15 0701 - 03/16 0700 In: 4620 [I.V.:4620] Out: 2625 [Urine:2625] Intake/Output this shift:    Physical Exam: Work of breathing is not elevated.  Sore.    Lab Results:  Results for orders placed or performed during the hospital encounter of 08/27/14 (from the past 48 hour(s))  Hemoglobin and hematocrit, blood     Status: None   Collection Time: 08/27/14 12:40 PM  Result Value Ref Range   Hemoglobin 12.4 12.0 - 15.0 g/dL   HCT 38.0 36.0 - 46.0 %  CBC WITH DIFFERENTIAL     Status: Abnormal   Collection Time: 08/28/14  5:12 AM  Result Value Ref Range   WBC 9.2 4.0 - 10.5 K/uL   RBC 4.40 3.87 - 5.11 MIL/uL   Hemoglobin 12.9 12.0 - 15.0 g/dL   HCT 39.3 36.0 - 46.0 %   MCV 89.3 78.0 - 100.0 fL   MCH 29.3 26.0 - 34.0 pg   MCHC 32.8 30.0 - 36.0 g/dL   RDW 13.1 11.5 - 15.5 %   Platelets 244 150 - 400 K/uL   Neutrophils Relative % 84 (H) 43 - 77 %   Neutro Abs 7.7 1.7 - 7.7 K/uL   Lymphocytes Relative 10 (L) 12 - 46 %   Lymphs Abs 0.9 0.7 - 4.0 K/uL   Monocytes Relative 6 3 - 12 %   Monocytes Absolute 0.5 0.1 - 1.0 K/uL   Eosinophils Relative 0 0 - 5 %   Eosinophils Absolute 0.0 0.0 - 0.7 K/uL   Basophils Relative 0 0 - 1 %   Basophils Absolute 0.0 0.0 - 0.1 K/uL  Comprehensive metabolic panel     Status: Abnormal   Collection Time: 08/28/14  5:12 AM  Result Value Ref  Range   Sodium 139 135 - 145 mmol/L   Potassium 4.2 3.5 - 5.1 mmol/L   Chloride 103 96 - 112 mmol/L   CO2 24 19 - 32 mmol/L   Glucose, Bld 153 (H) 70 - 99 mg/dL   BUN 14 6 - 23 mg/dL   Creatinine, Ser 1.04 0.50 - 1.10 mg/dL   Calcium 9.3 8.4 - 10.5 mg/dL   Total Protein 7.1 6.0 - 8.3 g/dL   Albumin 4.1 3.5 - 5.2 g/dL   AST 40 (H) 0 - 37 U/L   ALT 39 (H) 0 - 35 U/L   Alkaline Phosphatase 82 39 - 117 U/L   Total Bilirubin 0.6 0.3 - 1.2 mg/dL   GFR calc non Af Amer 54 (L) >90 mL/min   GFR calc Af Amer 63 (L) >90 mL/min    Comment: (NOTE) The eGFR has been calculated using the CKD EPI equation. This calculation has not been validated in all clinical situations. eGFR's persistently <90 mL/min signify possible Chronic Kidney Disease.    Anion gap 12  5 - 15    Radiology/Results: No results found.  Anti-infectives: Anti-infectives    Start     Dose/Rate Route Frequency Ordered Stop   08/27/14 0815  cefoTEtan in Dextrose 5% (CEFOTAN) IVPB 2 g     2 g Intravenous On call to O.R. 08/27/14 0759 08/27/14 0949      Assessment/Plan: Problem List: Patient Active Problem List   Diagnosis Date Noted  . 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 Morbid obesity 06/04/2014  . Pre-operative clearance 02/12/2014  . S/p nephrectomy- Lt 2011  02/12/2014  . Regurgitation 09/24/2013  . H/O laparoscopic adjustable gastric banding 05/22/2012 06/01/2012  . Obesity (BMI 30-39.9) 03/17/2012  . DEPRESSION 03/20/2009  . Coronary atherosclerosis 01/22/2009  . DYSPNEA 01/22/2009  . CHEST PAIN UNSPECIFIED 01/22/2009  . Hypothyroidism 06/28/2008  . HYPERLIPIDEMIA-MIXED 06/28/2008  . HYPERTENSION, BENIGN ESSENTIAL 06/28/2008    Appears to be doing well thus far.  Lytes OK.  Awaiting UGI this am.  If ok will start clears 1 Day Post-Op    LOS: 1 day   Matt B. Hassell Done, MD, [facility] [phone] beeper [phone]  08/28/2014 8:23 AM

## 2014-08-29 LAB — CBC WITH DIFFERENTIAL/PLATELET
Basophils Absolute: 0 10*3/uL (ref 0.0–0.1)
Basophils Relative: 0 % (ref 0–1)
Eosinophils Absolute: 0.1 10*3/uL (ref 0.0–0.7)
Eosinophils Relative: 1 % (ref 0–5)
HEMATOCRIT: 41.7 % (ref 36.0–46.0)
HEMOGLOBIN: 13.6 g/dL (ref 12.0–15.0)
LYMPHS ABS: 2.6 10*3/uL (ref 0.7–4.0)
Lymphocytes Relative: 23 % (ref 12–46)
MCH: 29.3 pg (ref 26.0–34.0)
MCHC: 32.6 g/dL (ref 30.0–36.0)
MCV: 89.9 fL (ref 78.0–100.0)
MONOS PCT: 8 % (ref 3–12)
Monocytes Absolute: 0.9 10*3/uL (ref 0.1–1.0)
NEUTROS PCT: 68 % (ref 43–77)
Neutro Abs: 7.4 10*3/uL (ref 1.7–7.7)
PLATELETS: 212 10*3/uL (ref 150–400)
RBC: 4.64 MIL/uL (ref 3.87–5.11)
RDW: 13.2 % (ref 11.5–15.5)
WBC: 11 10*3/uL — ABNORMAL HIGH (ref 4.0–10.5)

## 2014-08-29 LAB — COMPREHENSIVE METABOLIC PANEL
ALK PHOS: 80 U/L (ref 39–117)
ALT: 39 U/L — ABNORMAL HIGH (ref 0–35)
ANION GAP: 11 (ref 5–15)
AST: 35 U/L (ref 0–37)
Albumin: 4.5 g/dL (ref 3.5–5.2)
BILIRUBIN TOTAL: 0.6 mg/dL (ref 0.3–1.2)
BUN: 14 mg/dL (ref 6–23)
CALCIUM: 9.3 mg/dL (ref 8.4–10.5)
CO2: 26 mmol/L (ref 19–32)
Chloride: 101 mmol/L (ref 96–112)
Creatinine, Ser: 1.02 mg/dL (ref 0.50–1.10)
GFR calc Af Amer: 64 mL/min — ABNORMAL LOW (ref >90)
GFR, EST NON AFRICAN AMERICAN: 56 mL/min — AB (ref >90)
GLUCOSE: 124 mg/dL — AB (ref 70–99)
Potassium: 3.7 mmol/L (ref 3.5–5.1)
Sodium: 138 mmol/L (ref 135–145)
Total Protein: 7.5 g/dL (ref 6.0–8.3)

## 2014-08-29 MED ORDER — HYDRALAZINE HCL 20 MG/ML IJ SOLN
10.0000 mg | INTRAMUSCULAR | Status: DC | PRN
  Administered 2014-08-29 (×2): 10 mg via INTRAVENOUS
  Filled 2014-08-29 (×2): qty 1

## 2014-08-29 MED ORDER — ONDANSETRON 4 MG PO TBDP: 4.0000 mg | ORAL_TABLET | Freq: Three times a day (TID) | ORAL | Status: AC | PRN

## 2014-08-29 NOTE — Discharge Summary (Signed)
Physician Discharge Summary  Patient ID: Rachel GougeJanet L Dial MRN: [id number] DOB/AGE: 68/11/1945 68 y.o.  Admit date: 08/27/2014 Discharge date: 08/29/2014  Admission Diagnoses:  Prior failed lapband-post removal; solitary kidney  Discharge Diagnoses:  same  Active Problems:   S/P laparoscopic sleeve gastrectomy   Surgery:  Laparoscopic sleeve gastrectomy  Discharged Condition: improved  Hospital Course:   Had surgery.  UGI on PD 1 looked good.  PD 1 was up walking around with minimal pain.  PD 2 states that she is ready to go home.  Pledges to work with incentive spirometry and will maintain hydration.  OK for discharge  Consults: none  Significant Diagnostic Studies: UGI    Discharge Exam: Blood pressure 169/87, pulse 80, temperature 98.8 F (37.1 C), temperature source Oral, resp. rate 18, height 5\' 7"  (1.702 m), weight 108.5 kg (239 lb 3.2 oz), SpO2 97 %. Incisions ok.  No significant complaints of pain.  Work of breathing appears normal.  Voiding a lot and this may represent overhydration.    Disposition: 01-Home or Self Care  Discharge Instructions    Ambulate hourly while awake    Complete by:  As directed      Call MD for:  difficulty breathing, headache or visual disturbances    Complete by:  As directed      Call MD for:  persistant dizziness or light-headedness    Complete by:  As directed      Call MD for:  persistant nausea and vomiting    Complete by:  As directed      Call MD for:  redness, tenderness, or signs of infection (pain, swelling, redness, odor or green/yellow discharge around incision site)    Complete by:  As directed      Call MD for:  severe uncontrolled pain    Complete by:  As directed      Call MD for:  temperature >101 F    Complete by:  As directed      Diet bariatric full liquid    Complete by:  As directed      Incentive spirometry    Complete by:  As directed   Perform hourly while awake            Medication List    TAKE  these medications        amLODipine 2.5 MG tablet  Commonly known as:  NORVASC  Take 2.5 mg by mouth daily.     amLODipine 5 MG tablet  Commonly known as:  NORVASC  Take 1 tablet (5 mg total) by mouth daily.     aspirin 81 MG EC tablet  Take 81 mg by mouth every morning.     buPROPion 300 MG 24 hr tablet  Commonly known as:  WELLBUTRIN XL  Take 300 mg by mouth daily.     buPROPion 75 MG tablet  Commonly known as:  WELLBUTRIN  Take 1 tablet (75 mg total) by mouth 2 (two) times daily.     calcium-vitamin D 500-200 MG-UNIT per tablet  Commonly known as:  OSCAL WITH D  Take 1 tablet by mouth 3 (three) times daily.     clonazePAM 2 MG tablet  Commonly known as:  KLONOPIN  Take 2 mg by mouth at bedtime.     doxepin 50 MG capsule  Commonly known as:  SINEQUAN  Take 100 mg by mouth at bedtime.     DULoxetine 30 MG capsule  Commonly known as:  CYMBALTA  Take  90 mg by mouth daily.     furosemide 20 MG tablet  Commonly known as:  LASIX  Take 20 mg by mouth daily.     levothyroxine 75 MCG tablet  Commonly known as:  SYNTHROID, LEVOTHROID  Take 75 mcg by mouth daily before breakfast. Pt gets brand name     multivitamin tablet  Take 1 tablet by mouth every morning.     nitroGLYCERIN 0.4 MG SL tablet  Commonly known as:  NITROSTAT  Place 0.4 mg under the tongue every 5 (five) minutes as needed for chest pain.     ondansetron 4 MG disintegrating tablet  Commonly known as:  ZOFRAN ODT  Take 1 tablet (4 mg total) by mouth every 8 (eight) hours as needed for nausea or vomiting.     OXYCODONE HCL PO  Take 5-10 mLs by mouth every 4 (four) hours as needed (pain).     oxyCODONE 5 MG/5ML solution  Commonly known as:  ROXICODONE  Take 5-10 mLs (5-10 mg total) by mouth every 4 (four) hours as needed for moderate pain.     pregabalin 75 MG capsule  Commonly known as:  LYRICA  Take 75 mg by mouth 2 (two) times daily.     rosuvastatin 20 MG tablet  Commonly known as:  CRESTOR   Take 20 mg by mouth at bedtime.     vitamin B-12 1000 MCG tablet  Commonly known as:  CYANOCOBALAMIN  Take 1,000 mcg by mouth daily.           Follow-up Information    Follow up with Atilano Ina, MD.   Specialty:  General Surgery   Contact information:   [address] [place] [address] [phone]       Signed: Valarie Merino 08/29/2014, 8:41 AM

## 2014-08-29 NOTE — Discharge Instructions (Signed)

## 2014-08-29 NOTE — Progress Notes (Signed)
Discharge instructions given to pt with all questions answered.  

## 2014-08-29 NOTE — Progress Notes (Signed)
Patient alert and oriented, pain is controlled. Patient is tolerating fluids,  advanced to protein shake today, patient tolerated well.  Reviewed Gastric sleeve discharge instructions with patient and patient is able to articulate understanding.  Provided information on BELT program, Support Group and [facility] outpatient pharmacy. All questions answered, will continue to monitor.  

## 2014-08-30 ENCOUNTER — Telehealth (HOSPITAL_COMMUNITY): Payer: Self-pay

## 2014-08-30 NOTE — Telephone Encounter (Signed)
Attempted DROP discharge phone call, no answer, left message to return call  Made discharge phone call to patient per DROP protocol. Asking the following questions.    1. Do you have someone to care for you now that you are home?   2. Are you having pain now that is not relieved by your pain medication?   3. Are you able to drink the recommended daily amount of fluids (48 ounces minimum/day) and protein (60-80 grams/day) as prescribed by the dietitian or nutritional counselor?   4. Are you taking the vitamins and minerals as prescribed?   5. Do you have the "on call" number to contact your surgeon if you have a problem or question?   6. Are your incisions free of redness, swelling or drainage? (If steri strips, address that these can fall off, shower as tolerated)  7. Have your bowels moved since your surgery?  If not, are you passing gas?   8. Are you up and walking 3-4 times per day?      1. Do you have an appointment made to see your surgeon in the next month?   2. Were you provided your discharge medications before your surgery or before you were discharged from the hospital and are you taking them without problem?   3. Were you provided phone numbers to the clinic/surgeon's office?   4. Did you watch the patient education video module in the (clinic, surgeon's office, etc.) before your surgery?  5. Do you have a discharge checklist that was provided to you in the hospital to reference with instructions on how to take care of yourself after surgery?   6. Did you see a dietitian or nutritional counselor while you were in the hospital?   7. Do you have an appointment to see a dietitian or nutritional counselor in the next month?     

## 2014-08-30 NOTE — Telephone Encounter (Signed)
Patient returned Laurie's attempted call to answer the following questions...  Made discharge phone call to patient per DROP protocol. Asking the following questions.    1. Do you have someone to care for you now that you are home?  Yes, spouse 2. Are you having pain now that is not relieved by your pain medication?  No, not needing pain meds at all. No pain 3. Are you able to drink the recommended daily amount of fluids (48 ounces minimum/day) and protein (60-80 grams/day) as prescribed by the dietitian or nutritional counselor?  Woke up late this morning but have started on water & was able to get down 1/2 of a premier shake. Will plan for getting in recommended fluids today & 2 whole shakes if possible.  4. Are you taking the vitamins and minerals as prescribed?  Yes 5. Do you have the on call number to contact your surgeon if you have a problem or question?  Yes 6. Are your incisions free of redness, swelling or drainage? (If steri strips, address that these can fall off, shower as tolerated)  Yes, showered last night & all looks good 7. Have your bowels moved since your surgery?  If not, are you passing gas?  No bm, but passing gas. Will take milk of magnesia if necessary but keep us posted if not able. 8. Are you up and walking 3-4 times per day?  Yes  1. Do you have an appointment made to see your surgeon in the next month?  Yes 2. Were you provided your discharge medications before your surgery or before you were discharged from the hospital and are you taking them without problem?  Yes 3. Were you provided phone numbers to the clinic/surgeons office?  Yes 4. Did you watch the patient education video module in the (clinic, surgeons office, etc.) before your surgery? yes 5. Do you have a discharge checklist that was provided to you in the hospital to reference with instructions on how to take care of yourself after surgery?  Yes 6. Did you see a dietitian or nutritional counselor while  you were in the hospital?  Yes 7. Do you have an appointment to see a dietitian or nutritional counselor in the next month?  Yes

## 2014-09-10 ENCOUNTER — Encounter: Payer: Medicare Other | Attending: General Surgery

## 2014-09-10 DIAGNOSIS — Z713 Dietary counseling and surveillance: Secondary | ICD-10-CM | POA: Insufficient documentation

## 2014-09-10 DIAGNOSIS — Z6841 Body Mass Index (BMI) 40.0 and over, adult: Secondary | ICD-10-CM | POA: Diagnosis not present

## 2014-09-10 NOTE — Progress Notes (Signed)
Bariatric Class:  Appt start time: 1530 end time:  1630.  2 Week Post-Operative Nutrition Class  Patient was seen on 09/10/14 for Post-Operative Nutrition education at [facility].   Surgery date: 08/27/2014 Surgery type: Sleeve Start weight at [facility]: 235 lbs on 02/20/2014 Weight today: 231.0 lbs  Weight change: 8.5 lbs  TANITA  BODY COMP RESULTS  05/20/14 09/10/14   BMI (kg/m^2) 42.4 40.9   Fat Mass (lbs) 126.5 120.5   Fat Free Mass (lbs) 113.0 110.5   Total Body Water (lbs) 82.5 81.0    The following the learning objectives were met by the patient during this course:  Identifies Phase 3A (Soft, High Proteins) Dietary Goals and will begin from 2 weeks post-operatively to 2 months post-operatively  Identifies appropriate sources of fluids and proteins   States protein recommendations and appropriate sources post-operatively  Identifies the need for appropriate texture modifications, mastication, and bite sizes when consuming solids  Identifies appropriate multivitamin and calcium sources post-operatively  Describes the need for physical activity post-operatively and will follow MD recommendations  States when to call healthcare provider regarding medication questions or post-operative complications  Handouts given during class include:  Phase 3A: Soft, High Protein Diet Handout  Follow-Up Plan: Patient will follow-[facility] in 6 weeks for 2 month post-op nutrition visit for diet advancement per MD.

## 2014-10-13 DEATH — deceased

## 2014-10-22 ENCOUNTER — Ambulatory Visit: Payer: Medicare Other | Admitting: Dietician

## 2014-12-09 ENCOUNTER — Other Ambulatory Visit: Payer: Self-pay

## 2016-08-12 IMAGING — RF DG UGI W/ GASTROGRAFIN
13 series · 13 of 13 positions shown · IV contrast (omnipaque)
Comparison: Upper GI 07/30/2014

CLINICAL DATA: Post gastric sleeve bariatric surgery.

EXAM:
WATER SOLUBLE UPPER GI SERIES
TECHNIQUE: Single-column upper GI series was performed using water soluble
contrast.
CONTRAST:  50 cc Omnipaque

[Series 1: run · 1 of 1 slices shown (1 of 12)]
[im 1/1]
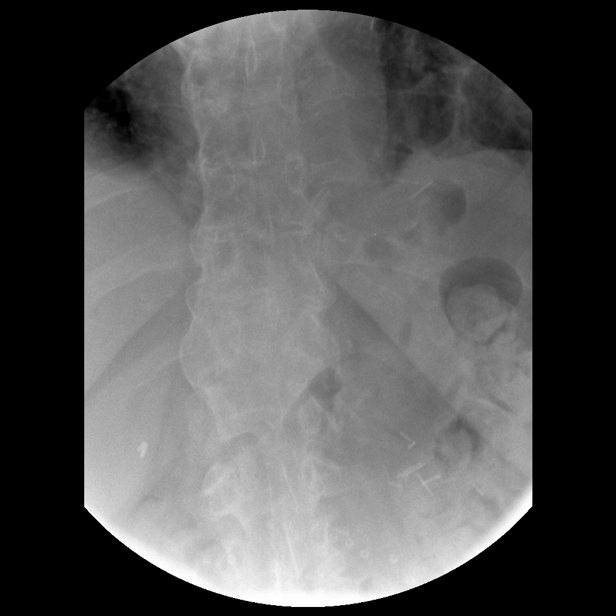

[Series 2: run · 1 of 1 slices shown (2 of 12)]
[im 1/1]
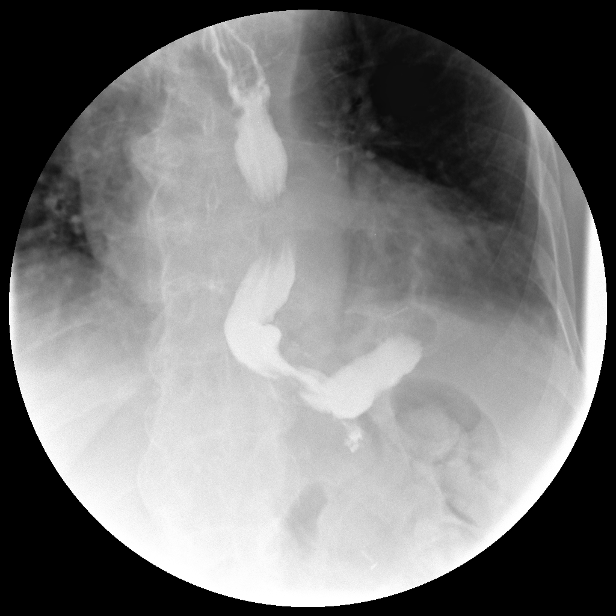

[Series 3: run · 1 of 1 slices shown (3 of 12)]
[im 1/1]
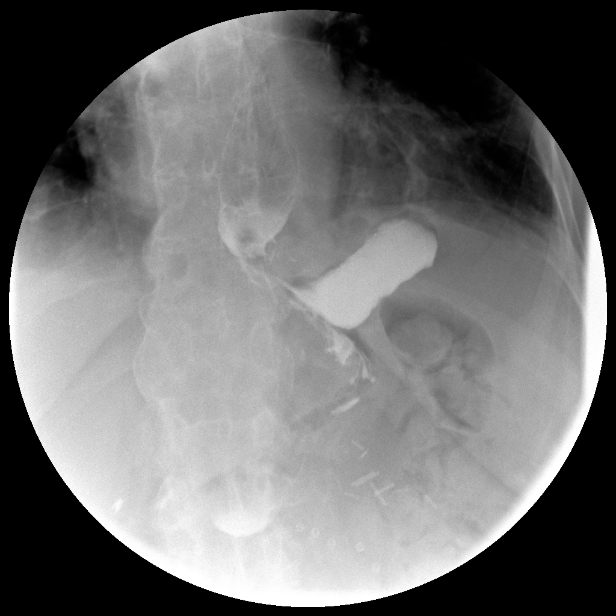

[Series 4: run · 1 of 1 slices shown (4 of 12)]
[im 1/1]
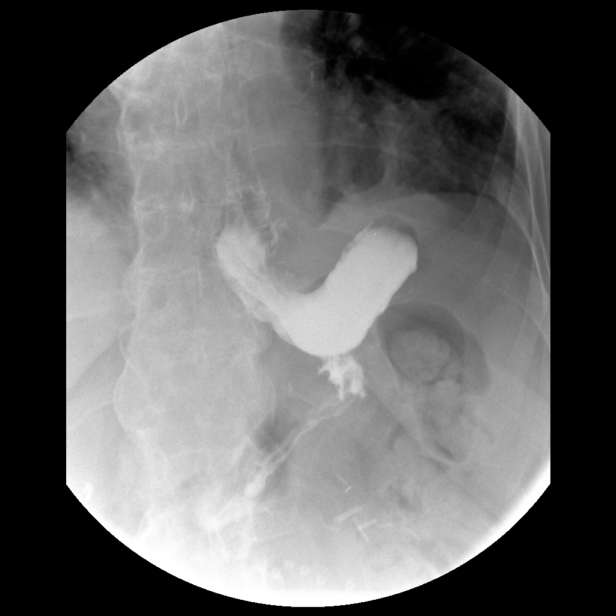

[Series 5: run · 1 of 1 slices shown (5 of 12)]
[im 1/1]
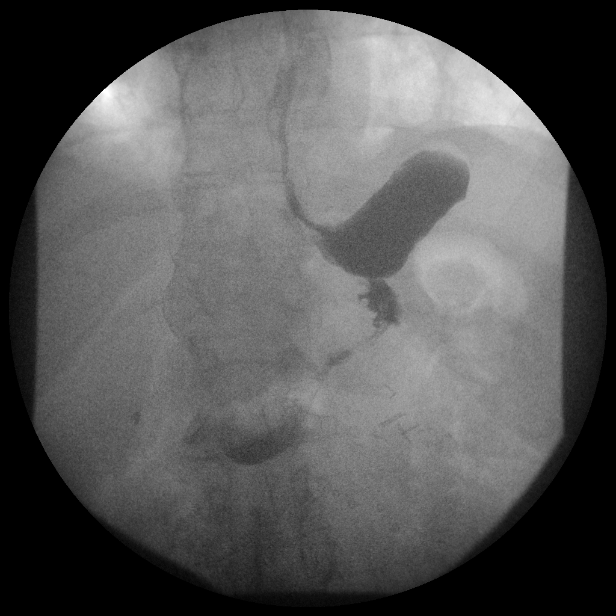

[Series 6: run · 1 of 1 slices shown (6 of 12)]
[im 1/1]
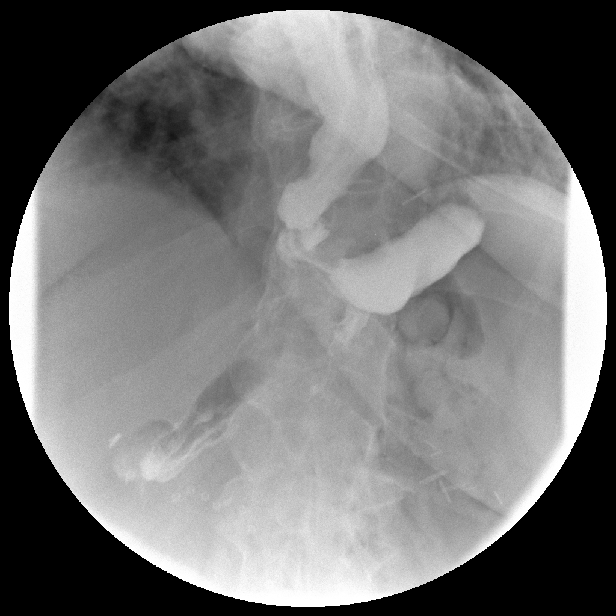

[Series 7: run · 1 of 1 slices shown (7 of 12)]
[im 1/1]
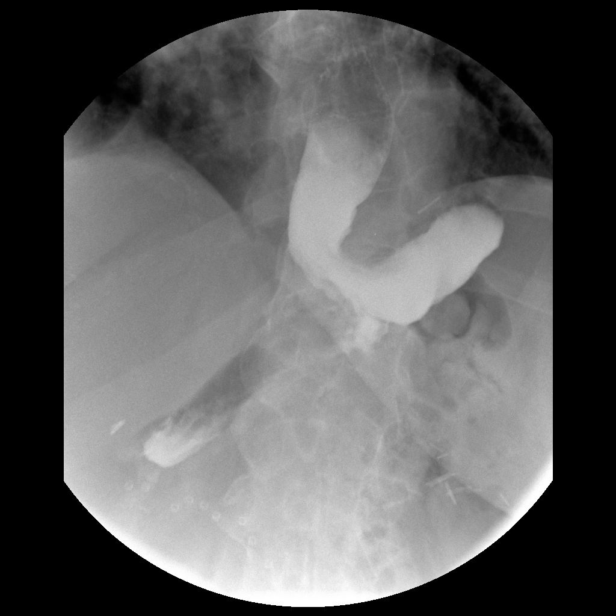

[Series 8: run · 1 of 1 slices shown (8 of 12)]
[im 1/1]
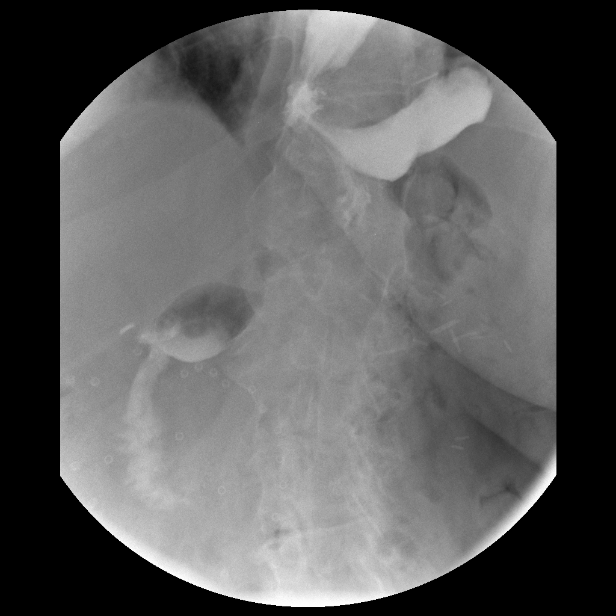

[Series 9: run · 1 of 1 slices shown (9 of 12)]
[im 1/1]
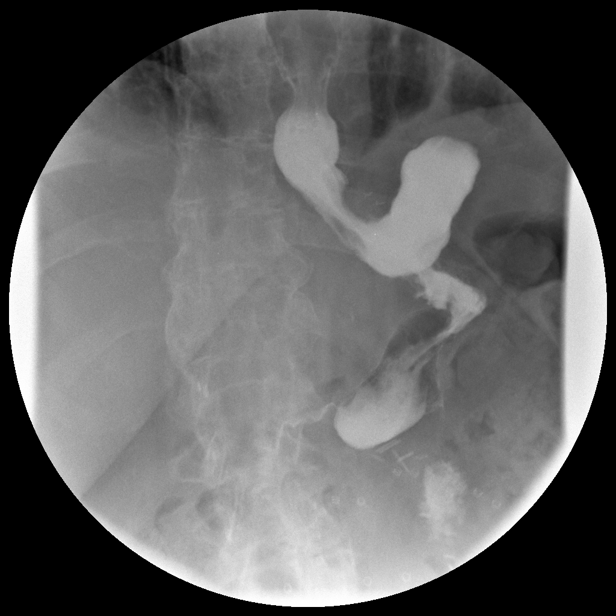

[Series 10: run · 1 of 1 slices shown (10 of 12)]
[im 1/1]
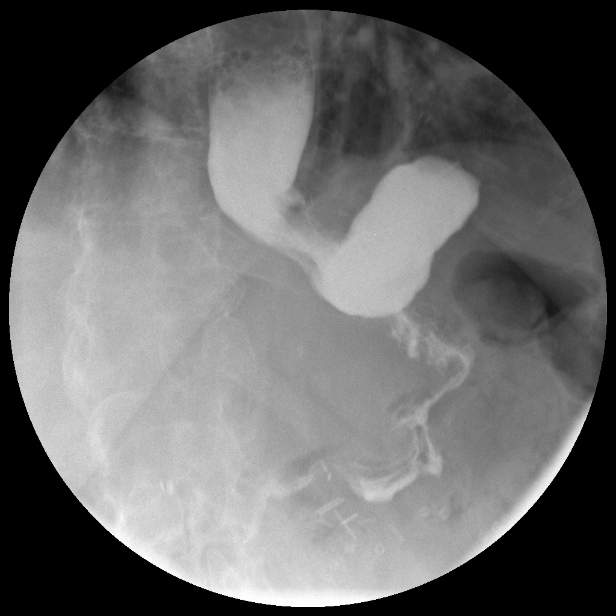

[Series 11: run · 1 of 1 slices shown (11 of 12)]
[im 1/1]
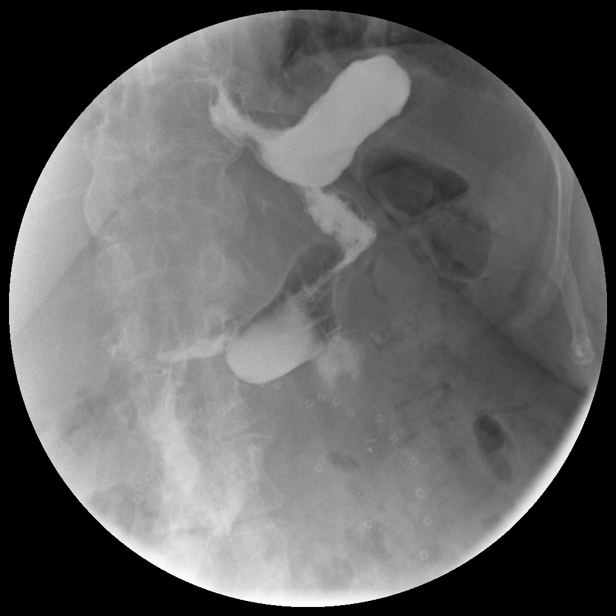

[Series 12: run · 1 of 1 slices shown (12 of 12)]
[im 1/1]
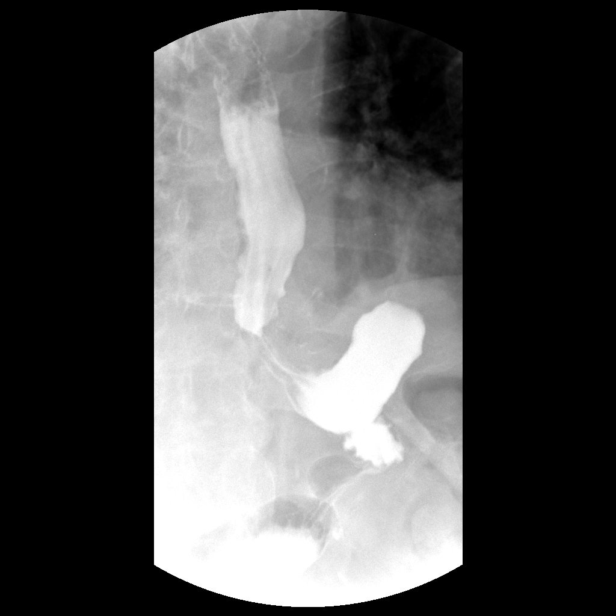

[Series 1001: view not recorded · 0.20mm/px · 1 of 1 slices shown]
[im 1/1]
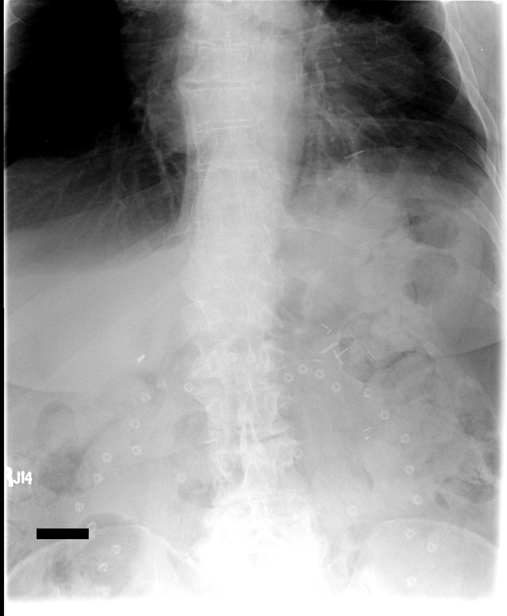

[13 of 13 positions shown; findings below may reference images not displayed]

FLUOROSCOPY TIME:  Radiation Exposure Index (as provided by the
fluoroscopic device):

If the device does not provide the exposure index:

Fluoroscopy Time (in minutes and seconds):  1 minutes 12 seconds

Number of Acquired Images:  12
FINDINGS: Water-soluble contrast flowed readily through the GE junction. There
is to and fro motion within the esophagus. Contrast fills the
gastric fundus preferentially. Small volume contrast flows along the
narrowed gastric body and antrum and into the second portion the
duodenum. No evidence obstruction or leak.
IMPRESSION: No evidence obstruction or leak following gastric sleeve bariatric
surgery.

Prominent gastric fundus.

Mild to and fro motion of contrast within the esophagus.
